# Patient Record
Sex: Male | Born: 1955 | Race: White | Hispanic: No | Smoking: Former smoker
Health system: Southern US, Community
[De-identification: ages and names within clinical notes are randomized; demographics above are authoritative.]

## PROBLEM LIST (undated history)

## (undated) DIAGNOSIS — R1084 Generalized abdominal pain: Secondary | ICD-10-CM

## (undated) DIAGNOSIS — M1990 Unspecified osteoarthritis, unspecified site: Secondary | ICD-10-CM

## (undated) DIAGNOSIS — K219 Gastro-esophageal reflux disease without esophagitis: Secondary | ICD-10-CM

## (undated) DIAGNOSIS — K5900 Constipation, unspecified: Secondary | ICD-10-CM

## (undated) DIAGNOSIS — G4730 Sleep apnea, unspecified: Secondary | ICD-10-CM

## (undated) DIAGNOSIS — M179 Osteoarthritis of knee, unspecified: Secondary | ICD-10-CM

## (undated) DIAGNOSIS — M5030 Other cervical disc degeneration, unspecified cervical region: Secondary | ICD-10-CM

## (undated) DIAGNOSIS — E119 Type 2 diabetes mellitus without complications: Secondary | ICD-10-CM

## (undated) DIAGNOSIS — D729 Disorder of white blood cells, unspecified: Secondary | ICD-10-CM

## (undated) DIAGNOSIS — H269 Unspecified cataract: Secondary | ICD-10-CM

## (undated) DIAGNOSIS — N200 Calculus of kidney: Secondary | ICD-10-CM

## (undated) DIAGNOSIS — E669 Obesity, unspecified: Secondary | ICD-10-CM

## (undated) DIAGNOSIS — M810 Age-related osteoporosis without current pathological fracture: Secondary | ICD-10-CM

## (undated) DIAGNOSIS — D649 Anemia, unspecified: Secondary | ICD-10-CM

## (undated) DIAGNOSIS — H9190 Unspecified hearing loss, unspecified ear: Secondary | ICD-10-CM

## (undated) DIAGNOSIS — K279 Peptic ulcer, site unspecified, unspecified as acute or chronic, without hemorrhage or perforation: Secondary | ICD-10-CM

## (undated) DIAGNOSIS — M1710 Unilateral primary osteoarthritis, unspecified knee: Secondary | ICD-10-CM

## (undated) DIAGNOSIS — M6740 Ganglion, unspecified site: Secondary | ICD-10-CM

## (undated) DIAGNOSIS — I519 Heart disease, unspecified: Secondary | ICD-10-CM

## (undated) DIAGNOSIS — E785 Hyperlipidemia, unspecified: Secondary | ICD-10-CM

## (undated) DIAGNOSIS — B20 Human immunodeficiency virus [HIV] disease: Secondary | ICD-10-CM

## (undated) DIAGNOSIS — Z21 Asymptomatic human immunodeficiency virus [HIV] infection status: Secondary | ICD-10-CM

## (undated) HISTORY — DX: Unspecified cataract: H26.9

## (undated) HISTORY — DX: Sleep apnea, unspecified: G47.30

## (undated) HISTORY — DX: Ganglion, unspecified site: M67.40

## (undated) HISTORY — DX: Human immunodeficiency virus (HIV) disease: B20

## (undated) HISTORY — DX: Calculus of kidney: N20.0

## (undated) HISTORY — DX: Osteoarthritis of knee, unspecified: M17.9

## (undated) HISTORY — DX: Generalized abdominal pain: R10.84

## (undated) HISTORY — DX: Unspecified hearing loss, unspecified ear: H91.90

## (undated) HISTORY — DX: Anemia, unspecified: D64.9

## (undated) HISTORY — PX: SPINE SURGERY: SHX786

## (undated) HISTORY — DX: Gastro-esophageal reflux disease without esophagitis: K21.9

## (undated) HISTORY — DX: Asymptomatic human immunodeficiency virus (hiv) infection status: Z21

## (undated) HISTORY — DX: Age-related osteoporosis without current pathological fracture: M81.0

## (undated) HISTORY — PX: COLONOSCOPY: SHX174

## (undated) HISTORY — PX: CERVICAL DISCECTOMY: SHX98

## (undated) HISTORY — DX: Unspecified osteoarthritis, unspecified site: M19.90

## (undated) HISTORY — DX: Other cervical disc degeneration, unspecified cervical region: M50.30

## (undated) HISTORY — DX: Hyperlipidemia, unspecified: E78.5

## (undated) HISTORY — DX: Type 2 diabetes mellitus without complications: E11.9

## (undated) HISTORY — DX: Disorder of white blood cells, unspecified: D72.9

## (undated) HISTORY — DX: Peptic ulcer, site unspecified, unspecified as acute or chronic, without hemorrhage or perforation: K27.9

## (undated) HISTORY — PX: POLYPECTOMY: SHX149

## (undated) HISTORY — DX: Constipation, unspecified: K59.00

## (undated) HISTORY — DX: Heart disease, unspecified: I51.9

## (undated) HISTORY — DX: Unilateral primary osteoarthritis, unspecified knee: M17.10

---
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
## 1997-02-09 ENCOUNTER — Encounter: Payer: Self-pay | Admitting: Infectious Disease

## 1997-02-09 ENCOUNTER — Encounter (INDEPENDENT_AMBULATORY_CARE_PROVIDER_SITE_OTHER): Payer: Self-pay | Admitting: *Deleted

## 1997-02-09 LAB — CONVERTED CEMR LAB
CD4 Count: 60 microliters
CD4 T Cell Abs: 60
HIV 1 RNA Quant: 543301 copies/mL

## 1998-04-09 ENCOUNTER — Encounter: Admission: RE | Admit: 1998-04-09 | Discharge: 1998-04-09 | Payer: Self-pay | Admitting: Infectious Diseases

## 1998-04-23 ENCOUNTER — Encounter: Admission: RE | Admit: 1998-04-23 | Discharge: 1998-04-23 | Payer: Self-pay | Admitting: Infectious Diseases

## 2000-08-10 ENCOUNTER — Ambulatory Visit (HOSPITAL_COMMUNITY): Admission: RE | Admit: 2000-08-10 | Discharge: 2000-08-10 | Payer: Self-pay | Admitting: Infectious Diseases

## 2000-08-24 ENCOUNTER — Encounter: Admission: RE | Admit: 2000-08-24 | Discharge: 2000-08-24 | Payer: Self-pay | Admitting: Infectious Diseases

## 2000-11-20 ENCOUNTER — Encounter: Admission: RE | Admit: 2000-11-20 | Discharge: 2000-11-20 | Payer: Self-pay | Admitting: Internal Medicine

## 2000-12-07 ENCOUNTER — Ambulatory Visit (HOSPITAL_COMMUNITY): Admission: RE | Admit: 2000-12-07 | Discharge: 2000-12-07 | Payer: Self-pay | Admitting: Infectious Diseases

## 2000-12-07 ENCOUNTER — Encounter: Admission: RE | Admit: 2000-12-07 | Discharge: 2000-12-07 | Payer: Self-pay | Admitting: Infectious Diseases

## 2001-02-17 ENCOUNTER — Encounter: Admission: RE | Admit: 2001-02-17 | Discharge: 2001-02-17 | Payer: Self-pay | Admitting: Internal Medicine

## 2001-12-27 ENCOUNTER — Encounter: Admission: RE | Admit: 2001-12-27 | Discharge: 2001-12-27 | Payer: Self-pay | Admitting: Infectious Diseases

## 2001-12-27 ENCOUNTER — Ambulatory Visit (HOSPITAL_COMMUNITY): Admission: RE | Admit: 2001-12-27 | Discharge: 2001-12-27 | Payer: Self-pay | Admitting: Infectious Diseases

## 2002-01-31 ENCOUNTER — Encounter: Admission: RE | Admit: 2002-01-31 | Discharge: 2002-01-31 | Payer: Self-pay | Admitting: Infectious Diseases

## 2002-02-21 ENCOUNTER — Encounter: Admission: RE | Admit: 2002-02-21 | Discharge: 2002-02-21 | Payer: Self-pay | Admitting: Infectious Diseases

## 2002-03-28 ENCOUNTER — Encounter: Admission: RE | Admit: 2002-03-28 | Discharge: 2002-03-28 | Payer: Self-pay | Admitting: Infectious Diseases

## 2002-03-28 ENCOUNTER — Ambulatory Visit (HOSPITAL_COMMUNITY): Admission: RE | Admit: 2002-03-28 | Discharge: 2002-03-28 | Payer: Self-pay | Admitting: Infectious Diseases

## 2002-05-02 ENCOUNTER — Encounter: Admission: RE | Admit: 2002-05-02 | Discharge: 2002-05-02 | Payer: Self-pay | Admitting: Infectious Diseases

## 2002-05-02 ENCOUNTER — Ambulatory Visit (HOSPITAL_COMMUNITY): Admission: RE | Admit: 2002-05-02 | Discharge: 2002-05-02 | Payer: Self-pay | Admitting: Infectious Diseases

## 2002-05-30 ENCOUNTER — Encounter: Admission: RE | Admit: 2002-05-30 | Discharge: 2002-05-30 | Payer: Self-pay | Admitting: Infectious Diseases

## 2002-06-13 ENCOUNTER — Encounter: Admission: RE | Admit: 2002-06-13 | Discharge: 2002-06-13 | Payer: Self-pay | Admitting: Infectious Diseases

## 2002-07-25 ENCOUNTER — Encounter: Admission: RE | Admit: 2002-07-25 | Discharge: 2002-07-25 | Payer: Self-pay | Admitting: Infectious Diseases

## 2002-09-01 ENCOUNTER — Ambulatory Visit (HOSPITAL_COMMUNITY): Admission: RE | Admit: 2002-09-01 | Discharge: 2002-09-01 | Payer: Self-pay | Admitting: Infectious Diseases

## 2002-09-01 ENCOUNTER — Encounter: Admission: RE | Admit: 2002-09-01 | Discharge: 2002-09-01 | Payer: Self-pay | Admitting: Infectious Diseases

## 2002-09-16 ENCOUNTER — Encounter: Payer: Self-pay | Admitting: Infectious Diseases

## 2002-09-16 ENCOUNTER — Ambulatory Visit (HOSPITAL_COMMUNITY): Admission: RE | Admit: 2002-09-16 | Discharge: 2002-09-16 | Payer: Self-pay | Admitting: Infectious Diseases

## 2002-09-20 ENCOUNTER — Encounter: Admission: RE | Admit: 2002-09-20 | Discharge: 2002-09-20 | Payer: Self-pay | Admitting: Infectious Diseases

## 2002-11-08 ENCOUNTER — Encounter: Admission: RE | Admit: 2002-11-08 | Discharge: 2002-11-08 | Payer: Self-pay | Admitting: Infectious Diseases

## 2002-12-08 ENCOUNTER — Encounter: Admission: RE | Admit: 2002-12-08 | Discharge: 2002-12-08 | Payer: Self-pay | Admitting: Infectious Diseases

## 2002-12-08 ENCOUNTER — Ambulatory Visit (HOSPITAL_COMMUNITY): Admission: RE | Admit: 2002-12-08 | Discharge: 2002-12-08 | Payer: Self-pay | Admitting: Infectious Diseases

## 2002-12-08 ENCOUNTER — Encounter: Admission: RE | Admit: 2002-12-08 | Discharge: 2002-12-08 | Payer: Self-pay | Admitting: Internal Medicine

## 2003-01-09 ENCOUNTER — Encounter: Admission: RE | Admit: 2003-01-09 | Discharge: 2003-01-09 | Payer: Self-pay | Admitting: Infectious Diseases

## 2003-03-06 ENCOUNTER — Encounter: Admission: RE | Admit: 2003-03-06 | Discharge: 2003-03-06 | Payer: Self-pay | Admitting: Infectious Diseases

## 2003-03-27 ENCOUNTER — Encounter: Admission: RE | Admit: 2003-03-27 | Discharge: 2003-03-27 | Payer: Self-pay | Admitting: Infectious Diseases

## 2003-05-01 ENCOUNTER — Encounter: Admission: RE | Admit: 2003-05-01 | Discharge: 2003-05-01 | Payer: Self-pay | Admitting: Infectious Diseases

## 2003-05-01 ENCOUNTER — Encounter (INDEPENDENT_AMBULATORY_CARE_PROVIDER_SITE_OTHER): Payer: Self-pay | Admitting: Infectious Diseases

## 2003-05-22 ENCOUNTER — Encounter: Admission: RE | Admit: 2003-05-22 | Discharge: 2003-05-22 | Payer: Self-pay | Admitting: Infectious Diseases

## 2003-07-27 ENCOUNTER — Other Ambulatory Visit (HOSPITAL_COMMUNITY): Admission: RE | Admit: 2003-07-27 | Discharge: 2003-08-18 | Payer: Self-pay | Admitting: Psychiatry

## 2003-08-21 ENCOUNTER — Encounter (INDEPENDENT_AMBULATORY_CARE_PROVIDER_SITE_OTHER): Payer: Self-pay | Admitting: Infectious Diseases

## 2003-08-21 ENCOUNTER — Ambulatory Visit (HOSPITAL_COMMUNITY): Admission: RE | Admit: 2003-08-21 | Discharge: 2003-08-21 | Payer: Self-pay | Admitting: Infectious Diseases

## 2003-08-21 ENCOUNTER — Encounter: Admission: RE | Admit: 2003-08-21 | Discharge: 2003-08-21 | Payer: Self-pay | Admitting: Infectious Diseases

## 2003-09-04 ENCOUNTER — Encounter: Admission: RE | Admit: 2003-09-04 | Discharge: 2003-09-04 | Payer: Self-pay | Admitting: Infectious Diseases

## 2003-11-15 ENCOUNTER — Encounter (INDEPENDENT_AMBULATORY_CARE_PROVIDER_SITE_OTHER): Payer: Self-pay | Admitting: Infectious Diseases

## 2003-11-15 ENCOUNTER — Encounter: Admission: RE | Admit: 2003-11-15 | Discharge: 2003-11-15 | Payer: Self-pay | Admitting: Infectious Diseases

## 2003-11-15 ENCOUNTER — Ambulatory Visit (HOSPITAL_COMMUNITY): Admission: RE | Admit: 2003-11-15 | Discharge: 2003-11-15 | Payer: Self-pay | Admitting: Infectious Diseases

## 2003-12-22 ENCOUNTER — Encounter: Admission: RE | Admit: 2003-12-22 | Discharge: 2003-12-22 | Payer: Self-pay | Admitting: Internal Medicine

## 2003-12-25 ENCOUNTER — Encounter: Admission: RE | Admit: 2003-12-25 | Discharge: 2003-12-25 | Payer: Self-pay | Admitting: Infectious Diseases

## 2004-01-30 ENCOUNTER — Emergency Department (HOSPITAL_COMMUNITY): Admission: EM | Admit: 2004-01-30 | Discharge: 2004-01-30 | Payer: Self-pay | Admitting: Emergency Medicine

## 2004-04-15 ENCOUNTER — Encounter: Admission: RE | Admit: 2004-04-15 | Discharge: 2004-04-15 | Payer: Self-pay | Admitting: Infectious Diseases

## 2004-04-29 ENCOUNTER — Encounter: Admission: RE | Admit: 2004-04-29 | Discharge: 2004-04-29 | Payer: Self-pay | Admitting: Infectious Diseases

## 2004-08-27 ENCOUNTER — Ambulatory Visit (HOSPITAL_COMMUNITY): Admission: RE | Admit: 2004-08-27 | Discharge: 2004-08-27 | Payer: Self-pay | Admitting: Infectious Diseases

## 2004-08-27 ENCOUNTER — Ambulatory Visit: Payer: Self-pay | Admitting: Infectious Diseases

## 2004-09-10 ENCOUNTER — Ambulatory Visit: Payer: Self-pay | Admitting: Infectious Diseases

## 2004-11-27 ENCOUNTER — Ambulatory Visit: Payer: Self-pay | Admitting: Infectious Diseases

## 2004-11-27 ENCOUNTER — Ambulatory Visit (HOSPITAL_COMMUNITY): Admission: RE | Admit: 2004-11-27 | Discharge: 2004-11-27 | Payer: Self-pay | Admitting: Infectious Diseases

## 2004-12-16 ENCOUNTER — Ambulatory Visit: Payer: Self-pay | Admitting: Infectious Diseases

## 2005-03-03 ENCOUNTER — Ambulatory Visit (HOSPITAL_COMMUNITY): Admission: RE | Admit: 2005-03-03 | Discharge: 2005-03-03 | Payer: Self-pay | Admitting: Infectious Diseases

## 2005-03-03 ENCOUNTER — Ambulatory Visit: Payer: Self-pay | Admitting: Internal Medicine

## 2005-03-17 ENCOUNTER — Ambulatory Visit: Payer: Self-pay | Admitting: Infectious Diseases

## 2005-07-15 ENCOUNTER — Emergency Department (HOSPITAL_COMMUNITY): Admission: EM | Admit: 2005-07-15 | Discharge: 2005-07-15 | Payer: Self-pay | Admitting: Family Medicine

## 2005-08-18 ENCOUNTER — Ambulatory Visit (HOSPITAL_COMMUNITY): Admission: RE | Admit: 2005-08-18 | Discharge: 2005-08-18 | Payer: Self-pay | Admitting: Infectious Diseases

## 2005-08-18 ENCOUNTER — Ambulatory Visit: Payer: Self-pay | Admitting: Infectious Diseases

## 2005-08-18 ENCOUNTER — Encounter (INDEPENDENT_AMBULATORY_CARE_PROVIDER_SITE_OTHER): Payer: Self-pay | Admitting: *Deleted

## 2005-08-18 LAB — CONVERTED CEMR LAB
CD4 Count: 530 uL
HIV 1 RNA Quant: 85 {copies}/mL

## 2005-09-01 ENCOUNTER — Ambulatory Visit: Payer: Self-pay | Admitting: Infectious Diseases

## 2006-04-22 ENCOUNTER — Emergency Department (HOSPITAL_COMMUNITY): Admission: EM | Admit: 2006-04-22 | Discharge: 2006-04-22 | Payer: Self-pay | Admitting: Emergency Medicine

## 2006-05-04 ENCOUNTER — Encounter (INDEPENDENT_AMBULATORY_CARE_PROVIDER_SITE_OTHER): Payer: Self-pay | Admitting: *Deleted

## 2006-05-04 ENCOUNTER — Ambulatory Visit: Payer: Self-pay | Admitting: Infectious Diseases

## 2006-05-04 ENCOUNTER — Encounter: Admission: RE | Admit: 2006-05-04 | Discharge: 2006-05-04 | Payer: Self-pay | Admitting: Infectious Diseases

## 2006-05-04 LAB — CONVERTED CEMR LAB
CD4 Count: 590 microliters
HIV 1 RNA Quant: 49 copies/mL

## 2006-05-18 ENCOUNTER — Ambulatory Visit: Payer: Self-pay | Admitting: Infectious Diseases

## 2006-11-02 ENCOUNTER — Encounter (INDEPENDENT_AMBULATORY_CARE_PROVIDER_SITE_OTHER): Payer: Self-pay | Admitting: *Deleted

## 2006-11-02 ENCOUNTER — Encounter: Admission: RE | Admit: 2006-11-02 | Discharge: 2006-11-02 | Payer: Self-pay | Admitting: Infectious Diseases

## 2006-11-02 ENCOUNTER — Ambulatory Visit: Payer: Self-pay | Admitting: Infectious Diseases

## 2006-11-02 LAB — CONVERTED CEMR LAB
ALT: 16 units/L (ref 0–53)
AST: 15 units/L (ref 0–37)
Albumin: 3.9 g/dL (ref 3.5–5.2)
Alkaline Phosphatase: 113 units/L (ref 39–117)
BUN: 12 mg/dL (ref 6–23)
Basophils Absolute: 0.1 10*3/uL (ref 0.0–0.1)
Basophils Relative: 1 % (ref 0–1)
CD4 Count: 610 microliters
CO2: 25 meq/L (ref 19–32)
Calcium: 9.1 mg/dL (ref 8.4–10.5)
Chloride: 107 meq/L (ref 96–112)
Creatinine, Ser: 0.94 mg/dL (ref 0.40–1.50)
Eosinophils Relative: 4 % (ref 0–4)
Glucose, Bld: 94 mg/dL (ref 70–99)
HCT: 49.3 % — ABNORMAL HIGH (ref 41.0–49.0)
HIV 1 RNA Quant: 208 copies/mL
HIV 1 RNA Quant: 208 copies/mL — ABNORMAL HIGH (ref <50)
HIV-1 RNA Quant, Log: 2.32 — ABNORMAL HIGH (ref <1.70)
Hemoglobin: 16 g/dL (ref 13.9–16.8)
Lymphocytes Relative: 31 % (ref 15–43)
Lymphs Abs: 3 10*3/uL (ref 0.8–3.1)
MCHC: 32.5 g/dL — ABNORMAL LOW (ref 33.1–35.4)
MCV: 100 fL (ref 78.8–100.0)
Monocytes Absolute: 0.8 10*3/uL — ABNORMAL HIGH (ref 0.2–0.7)
Monocytes Relative: 8 % (ref 3–11)
Neutro Abs: 5.4 10*3/uL (ref 1.8–6.8)
Neutrophils Relative %: 56 % (ref 47–77)
Platelets: 358 10*3/uL (ref 152–374)
Potassium: 4.5 meq/L (ref 3.5–5.3)
RBC: 4.93 M/uL (ref 4.20–5.50)
RDW: 13.7 % (ref 11.5–15.3)
Sodium: 146 meq/L — ABNORMAL HIGH (ref 135–145)
Total Bilirubin: 0.4 mg/dL (ref 0.3–1.2)
Total Protein: 7.1 g/dL (ref 6.0–8.3)
WBC: 9.7 10*3/uL (ref 3.7–10.0)

## 2006-11-16 ENCOUNTER — Ambulatory Visit: Payer: Self-pay | Admitting: Infectious Diseases

## 2006-12-28 DIAGNOSIS — B20 Human immunodeficiency virus [HIV] disease: Secondary | ICD-10-CM | POA: Insufficient documentation

## 2007-01-11 ENCOUNTER — Encounter (INDEPENDENT_AMBULATORY_CARE_PROVIDER_SITE_OTHER): Payer: Self-pay | Admitting: Infectious Diseases

## 2007-01-25 ENCOUNTER — Encounter (INDEPENDENT_AMBULATORY_CARE_PROVIDER_SITE_OTHER): Payer: Self-pay | Admitting: *Deleted

## 2007-01-25 LAB — CONVERTED CEMR LAB

## 2007-02-07 ENCOUNTER — Encounter (INDEPENDENT_AMBULATORY_CARE_PROVIDER_SITE_OTHER): Payer: Self-pay | Admitting: *Deleted

## 2007-02-09 ENCOUNTER — Encounter (INDEPENDENT_AMBULATORY_CARE_PROVIDER_SITE_OTHER): Payer: Self-pay | Admitting: *Deleted

## 2007-05-11 ENCOUNTER — Ambulatory Visit: Payer: Self-pay | Admitting: Infectious Diseases

## 2007-05-11 ENCOUNTER — Encounter: Admission: RE | Admit: 2007-05-11 | Discharge: 2007-05-11 | Payer: Self-pay | Admitting: Infectious Diseases

## 2007-05-11 LAB — CONVERTED CEMR LAB
ALT: 13 units/L (ref 0–53)
AST: 14 units/L (ref 0–37)
Albumin: 4.2 g/dL (ref 3.5–5.2)
Alkaline Phosphatase: 129 units/L — ABNORMAL HIGH (ref 39–117)
BUN: 12 mg/dL (ref 6–23)
Basophils Absolute: 0.1 10*3/uL (ref 0.0–0.1)
Basophils Relative: 1 % (ref 0–1)
Bilirubin Urine: NEGATIVE
CD4 Count: 890 microliters
CO2: 25 meq/L (ref 19–32)
Calcium: 9.5 mg/dL (ref 8.4–10.5)
Chloride: 102 meq/L (ref 96–112)
Cholesterol: 202 mg/dL — ABNORMAL HIGH (ref 0–200)
Creatinine, Ser: 0.89 mg/dL (ref 0.40–1.50)
Eosinophils Absolute: 0.4 10*3/uL (ref 0.0–0.7)
Eosinophils Relative: 3 % (ref 0–5)
Glucose, Bld: 59 mg/dL — ABNORMAL LOW (ref 70–99)
HCT: 48.1 % (ref 39.0–52.0)
HDL: 41 mg/dL (ref >39)
HIV 1 RNA Quant: 131 copies/mL — ABNORMAL HIGH (ref <50)
HIV-1 RNA Quant, Log: 2.12 — ABNORMAL HIGH (ref <1.70)
Hemoglobin: 16.3 g/dL (ref 13.0–17.0)
Ketones, ur: NEGATIVE mg/dL
LDL Cholesterol: 101 mg/dL — ABNORMAL HIGH (ref 0–99)
Leukocytes, UA: NEGATIVE
Lymphocytes Relative: 39 % (ref 12–46)
Lymphs Abs: 4.8 10*3/uL — ABNORMAL HIGH (ref 0.7–3.3)
MCHC: 33.9 g/dL (ref 30.0–36.0)
MCV: 96.4 fL (ref 78.0–100.0)
Monocytes Absolute: 1.2 10*3/uL — ABNORMAL HIGH (ref 0.2–0.7)
Monocytes Relative: 10 % (ref 3–11)
Neutro Abs: 5.7 10*3/uL (ref 1.7–7.7)
Neutrophils Relative %: 47 % (ref 43–77)
Nitrite: NEGATIVE
Platelets: 366 10*3/uL (ref 150–400)
Potassium: 4.5 meq/L (ref 3.5–5.3)
Protein, ur: NEGATIVE mg/dL
RBC: 4.99 M/uL (ref 4.22–5.81)
RDW: 14.1 % — ABNORMAL HIGH (ref 11.5–14.0)
Sodium: 138 meq/L (ref 135–145)
Specific Gravity, Urine: 1.023 (ref 1.005–1.03)
Total Bilirubin: 0.4 mg/dL (ref 0.3–1.2)
Total CHOL/HDL Ratio: 4.9
Total Protein: 8 g/dL (ref 6.0–8.3)
Triglycerides: 300 mg/dL — ABNORMAL HIGH (ref <150)
Urine Glucose: NEGATIVE mg/dL
Urobilinogen, UA: 0.2 (ref 0.0–1.0)
VLDL: 60 mg/dL — ABNORMAL HIGH (ref 0–40)
WBC, UA: NONE SEEN cells/hpf (ref <3)
WBC: 12.2 10*3/uL — ABNORMAL HIGH (ref 4.0–10.5)
pH: 6 (ref 5.0–8.0)

## 2007-05-18 ENCOUNTER — Ambulatory Visit: Payer: Self-pay | Admitting: Infectious Diseases

## 2007-05-18 ENCOUNTER — Encounter (INDEPENDENT_AMBULATORY_CARE_PROVIDER_SITE_OTHER): Payer: Self-pay | Admitting: *Deleted

## 2007-06-18 ENCOUNTER — Encounter (INDEPENDENT_AMBULATORY_CARE_PROVIDER_SITE_OTHER): Payer: Self-pay | Admitting: *Deleted

## 2007-09-02 ENCOUNTER — Encounter: Admission: RE | Admit: 2007-09-02 | Discharge: 2007-09-02 | Payer: Self-pay | Admitting: Infectious Disease

## 2007-09-02 ENCOUNTER — Ambulatory Visit: Payer: Self-pay | Admitting: Infectious Disease

## 2007-09-02 LAB — CONVERTED CEMR LAB
Basophils Absolute: 0.1 10*3/uL (ref 0.0–0.1)
Basophils Relative: 1 % (ref 0–1)
Eosinophils Absolute: 0.3 10*3/uL (ref 0.0–0.7)
Eosinophils Relative: 3 % (ref 0–5)
HCT: 48.5 % (ref 39.0–52.0)
HIV 1 RNA Quant: 154 copies/mL — ABNORMAL HIGH (ref <50)
HIV-1 RNA Quant, Log: 2.19 — ABNORMAL HIGH (ref <1.70)
Hemoglobin: 16 g/dL (ref 13.0–17.0)
Lymphocytes Relative: 35 % (ref 12–46)
Lymphs Abs: 3.2 10*3/uL (ref 0.7–3.3)
MCHC: 33 g/dL (ref 30.0–36.0)
MCV: 97.8 fL (ref 78.0–100.0)
Monocytes Absolute: 0.7 10*3/uL (ref 0.2–0.7)
Monocytes Relative: 8 % (ref 3–11)
Neutro Abs: 4.8 10*3/uL (ref 1.7–7.7)
Neutrophils Relative %: 53 % (ref 43–77)
Platelets: 372 10*3/uL (ref 150–400)
RBC: 4.96 M/uL (ref 4.22–5.81)
RDW: 14.2 % — ABNORMAL HIGH (ref 11.5–14.0)
WBC: 9.2 10*3/uL (ref 4.0–10.5)

## 2007-09-29 ENCOUNTER — Ambulatory Visit: Payer: Self-pay | Admitting: Infectious Disease

## 2007-09-29 DIAGNOSIS — F17200 Nicotine dependence, unspecified, uncomplicated: Secondary | ICD-10-CM | POA: Insufficient documentation

## 2007-11-19 ENCOUNTER — Ambulatory Visit: Payer: Self-pay | Admitting: Infectious Disease

## 2007-11-19 ENCOUNTER — Encounter: Admission: RE | Admit: 2007-11-19 | Discharge: 2007-11-19 | Payer: Self-pay | Admitting: Infectious Disease

## 2007-11-19 LAB — CONVERTED CEMR LAB
ALT: 32 units/L (ref 0–53)
AST: 25 units/L (ref 0–37)
Albumin: 4.3 g/dL (ref 3.5–5.2)
Alkaline Phosphatase: 151 units/L — ABNORMAL HIGH (ref 39–117)
BUN: 11 mg/dL (ref 6–23)
Basophils Absolute: 0.2 10*3/uL — ABNORMAL HIGH (ref 0.0–0.1)
Basophils Relative: 2 % — ABNORMAL HIGH (ref 0–1)
CO2: 23 meq/L (ref 19–32)
Calcium: 9.5 mg/dL (ref 8.4–10.5)
Chloride: 100 meq/L (ref 96–112)
Creatinine, Ser: 0.97 mg/dL (ref 0.40–1.50)
Eosinophils Absolute: 0.3 10*3/uL (ref 0.2–0.7)
Eosinophils Relative: 3 % (ref 0–5)
Glucose, Bld: 124 mg/dL — ABNORMAL HIGH (ref 70–99)
HCT: 50.2 % (ref 39.0–52.0)
HIV 1 RNA Quant: <50 copies/mL (ref <50)
HIV-1 RNA Quant, Log: <1.7 (ref <1.70)
Hemoglobin: 16.9 g/dL (ref 13.0–17.0)
Lymphocytes Relative: 33 % (ref 12–46)
Lymphs Abs: 3.3 10*3/uL (ref 0.7–4.0)
MCHC: 33.7 g/dL (ref 30.0–36.0)
MCV: 95.8 fL (ref 78.0–100.0)
Monocytes Absolute: 0.9 10*3/uL (ref 0.1–1.0)
Monocytes Relative: 10 % (ref 3–12)
Neutro Abs: 5.2 10*3/uL (ref 1.7–7.7)
Neutrophils Relative %: 53 % (ref 43–77)
Platelets: 355 10*3/uL (ref 150–400)
Potassium: 4.2 meq/L (ref 3.5–5.3)
RBC: 5.24 M/uL (ref 4.22–5.81)
RDW: 13.7 % (ref 11.5–15.5)
Sodium: 136 meq/L (ref 135–145)
Total Bilirubin: 0.3 mg/dL (ref 0.3–1.2)
Total Protein: 7.8 g/dL (ref 6.0–8.3)
WBC: 9.9 10*3/uL (ref 4.0–10.5)

## 2007-11-29 ENCOUNTER — Encounter (INDEPENDENT_AMBULATORY_CARE_PROVIDER_SITE_OTHER): Payer: Self-pay | Admitting: *Deleted

## 2007-11-30 ENCOUNTER — Encounter (INDEPENDENT_AMBULATORY_CARE_PROVIDER_SITE_OTHER): Payer: Self-pay | Admitting: *Deleted

## 2007-12-13 ENCOUNTER — Ambulatory Visit: Payer: Self-pay | Admitting: Infectious Disease

## 2007-12-16 ENCOUNTER — Encounter: Payer: Self-pay | Admitting: Infectious Disease

## 2007-12-16 ENCOUNTER — Ambulatory Visit (HOSPITAL_COMMUNITY): Admission: RE | Admit: 2007-12-16 | Discharge: 2007-12-16 | Payer: Self-pay | Admitting: Infectious Disease

## 2007-12-20 ENCOUNTER — Telehealth (INDEPENDENT_AMBULATORY_CARE_PROVIDER_SITE_OTHER): Payer: Self-pay | Admitting: *Deleted

## 2007-12-22 ENCOUNTER — Ambulatory Visit: Payer: Self-pay | Admitting: Infectious Disease

## 2007-12-22 LAB — CONVERTED CEMR LAB: Crypto Ag: NEGATIVE

## 2007-12-23 ENCOUNTER — Telehealth: Payer: Self-pay | Admitting: Infectious Disease

## 2007-12-24 ENCOUNTER — Telehealth: Payer: Self-pay | Admitting: Infectious Disease

## 2007-12-27 ENCOUNTER — Encounter: Payer: Self-pay | Admitting: Infectious Disease

## 2007-12-29 ENCOUNTER — Telehealth: Payer: Self-pay | Admitting: Infectious Disease

## 2007-12-31 ENCOUNTER — Emergency Department (HOSPITAL_COMMUNITY): Admission: EM | Admit: 2007-12-31 | Discharge: 2007-12-31 | Payer: Self-pay | Admitting: Emergency Medicine

## 2007-12-31 ENCOUNTER — Telehealth: Payer: Self-pay | Admitting: Infectious Disease

## 2008-01-01 ENCOUNTER — Encounter: Payer: Self-pay | Admitting: Infectious Disease

## 2008-01-03 ENCOUNTER — Ambulatory Visit: Payer: Self-pay | Admitting: Infectious Disease

## 2008-01-03 DIAGNOSIS — K279 Peptic ulcer, site unspecified, unspecified as acute or chronic, without hemorrhage or perforation: Secondary | ICD-10-CM | POA: Insufficient documentation

## 2008-01-03 LAB — CONVERTED CEMR LAB
Basophils Absolute: 0.1 10*3/uL (ref 0.0–0.1)
Basophils Relative: 1 % (ref 0–1)
Eosinophils Absolute: 0.3 10*3/uL (ref 0.0–0.7)
Eosinophils Relative: 3 % (ref 0–5)
HCT: 48.5 % (ref 39.0–52.0)
Hemoglobin: 16.4 g/dL (ref 13.0–17.0)
Lymphocytes Relative: 34 % (ref 12–46)
Lymphs Abs: 3.9 10*3/uL (ref 0.7–4.0)
MCHC: 33.8 g/dL (ref 30.0–36.0)
MCV: 95.5 fL (ref 78.0–100.0)
Monocytes Absolute: 1.2 10*3/uL — ABNORMAL HIGH (ref 0.1–1.0)
Monocytes Relative: 11 % (ref 3–12)
Neutro Abs: 5.8 10*3/uL (ref 1.7–7.7)
Neutrophils Relative %: 52 % (ref 43–77)
Platelets: 353 10*3/uL (ref 150–400)
RBC: 5.08 M/uL (ref 4.22–5.81)
RDW: 13.9 % (ref 11.5–15.5)
WBC: 11.3 10*3/uL — ABNORMAL HIGH (ref 4.0–10.5)

## 2008-01-12 ENCOUNTER — Telehealth: Payer: Self-pay

## 2008-01-17 ENCOUNTER — Encounter (INDEPENDENT_AMBULATORY_CARE_PROVIDER_SITE_OTHER): Payer: Self-pay | Admitting: *Deleted

## 2008-01-17 ENCOUNTER — Telehealth: Payer: Self-pay | Admitting: Infectious Disease

## 2008-01-21 ENCOUNTER — Encounter (INDEPENDENT_AMBULATORY_CARE_PROVIDER_SITE_OTHER): Payer: Self-pay | Admitting: *Deleted

## 2008-02-09 ENCOUNTER — Encounter: Admission: RE | Admit: 2008-02-09 | Discharge: 2008-02-09 | Payer: Self-pay | Admitting: Infectious Disease

## 2008-02-09 ENCOUNTER — Ambulatory Visit: Payer: Self-pay | Admitting: Infectious Disease

## 2008-02-09 LAB — CONVERTED CEMR LAB
ALT: 25 units/L (ref 0–53)
AST: 23 units/L (ref 0–37)
Albumin: 4.3 g/dL (ref 3.5–5.2)
Alkaline Phosphatase: 126 units/L — ABNORMAL HIGH (ref 39–117)
BUN: 10 mg/dL (ref 6–23)
Basophils Absolute: 0.1 10*3/uL (ref 0.0–0.1)
Basophils Relative: 1 % (ref 0–1)
CO2: 24 meq/L (ref 19–32)
Calcium: 9.5 mg/dL (ref 8.4–10.5)
Chloride: 103 meq/L (ref 96–112)
Creatinine, Ser: 0.75 mg/dL (ref 0.40–1.50)
Eosinophils Absolute: 0.3 10*3/uL (ref 0.0–0.7)
Eosinophils Relative: 3 % (ref 0–5)
Glucose, Bld: 106 mg/dL — ABNORMAL HIGH (ref 70–99)
HCT: 50.6 % (ref 39.0–52.0)
HIV 1 RNA Quant: 375 copies/mL — ABNORMAL HIGH (ref <50)
HIV-1 RNA Quant, Log: 2.57 — ABNORMAL HIGH (ref <1.70)
Hemoglobin: 16.6 g/dL (ref 13.0–17.0)
Lymphocytes Relative: 37 % (ref 12–46)
Lymphs Abs: 3.7 10*3/uL (ref 0.7–4.0)
MCHC: 32.8 g/dL (ref 30.0–36.0)
MCV: 95.1 fL (ref 78.0–100.0)
Monocytes Absolute: 1.1 10*3/uL — ABNORMAL HIGH (ref 0.1–1.0)
Monocytes Relative: 10 % (ref 3–12)
Neutro Abs: 5 10*3/uL (ref 1.7–7.7)
Neutrophils Relative %: 49 % (ref 43–77)
Platelets: 339 10*3/uL (ref 150–400)
Potassium: 4.2 meq/L (ref 3.5–5.3)
RBC: 5.32 M/uL (ref 4.22–5.81)
RDW: 14.1 % (ref 11.5–15.5)
Sodium: 139 meq/L (ref 135–145)
Total Bilirubin: 0.4 mg/dL (ref 0.3–1.2)
Total Protein: 7.8 g/dL (ref 6.0–8.3)
WBC: 10.2 10*3/uL (ref 4.0–10.5)

## 2008-02-28 ENCOUNTER — Ambulatory Visit: Payer: Self-pay | Admitting: Infectious Disease

## 2008-02-28 DIAGNOSIS — G609 Hereditary and idiopathic neuropathy, unspecified: Secondary | ICD-10-CM | POA: Insufficient documentation

## 2008-02-28 DIAGNOSIS — F0630 Mood disorder due to known physiological condition, unspecified: Secondary | ICD-10-CM | POA: Insufficient documentation

## 2008-03-01 ENCOUNTER — Ambulatory Visit (HOSPITAL_COMMUNITY): Admission: RE | Admit: 2008-03-01 | Discharge: 2008-03-01 | Payer: Self-pay | Admitting: Infectious Disease

## 2008-04-06 ENCOUNTER — Telehealth: Payer: Self-pay | Admitting: Infectious Disease

## 2008-04-06 DIAGNOSIS — K047 Periapical abscess without sinus: Secondary | ICD-10-CM | POA: Insufficient documentation

## 2008-04-19 ENCOUNTER — Telehealth: Payer: Self-pay | Admitting: Infectious Disease

## 2008-04-21 ENCOUNTER — Ambulatory Visit: Payer: Self-pay | Admitting: Gastroenterology

## 2008-04-21 DIAGNOSIS — E785 Hyperlipidemia, unspecified: Secondary | ICD-10-CM | POA: Insufficient documentation

## 2008-04-21 DIAGNOSIS — K219 Gastro-esophageal reflux disease without esophagitis: Secondary | ICD-10-CM | POA: Insufficient documentation

## 2008-05-01 ENCOUNTER — Ambulatory Visit: Payer: Self-pay | Admitting: Infectious Disease

## 2008-05-01 ENCOUNTER — Encounter: Admission: RE | Admit: 2008-05-01 | Discharge: 2008-05-01 | Payer: Self-pay | Admitting: Infectious Disease

## 2008-05-01 ENCOUNTER — Encounter (INDEPENDENT_AMBULATORY_CARE_PROVIDER_SITE_OTHER): Payer: Self-pay | Admitting: *Deleted

## 2008-05-01 LAB — CONVERTED CEMR LAB
ALT: 25 units/L (ref 0–53)
AST: 22 units/L (ref 0–37)
Albumin: 4.5 g/dL (ref 3.5–5.2)
Alkaline Phosphatase: 113 units/L (ref 39–117)
BUN: 13 mg/dL (ref 6–23)
Basophils Absolute: 0.1 10*3/uL (ref 0.0–0.1)
Basophils Relative: 1 % (ref 0–1)
CO2: 24 meq/L (ref 19–32)
Calcium: 10.2 mg/dL (ref 8.4–10.5)
Chloride: 98 meq/L (ref 96–112)
Cholesterol: 184 mg/dL (ref 0–200)
Creatinine, Ser: 0.92 mg/dL (ref 0.40–1.50)
Eosinophils Absolute: 0.3 10*3/uL (ref 0.0–0.7)
Eosinophils Relative: 3 % (ref 0–5)
Glucose, Bld: 100 mg/dL — ABNORMAL HIGH (ref 70–99)
HCT: 53.5 % — ABNORMAL HIGH (ref 39.0–52.0)
HDL: 38 mg/dL — ABNORMAL LOW (ref >39)
HIV 1 RNA Quant: 233 copies/mL — ABNORMAL HIGH (ref <50)
HIV-1 RNA Quant, Log: 2.37 — ABNORMAL HIGH (ref <1.70)
Hemoglobin: 18.1 g/dL — ABNORMAL HIGH (ref 13.0–17.0)
LDL Cholesterol: 88 mg/dL (ref 0–99)
Lymphocytes Relative: 33 % (ref 12–46)
Lymphs Abs: 3.3 10*3/uL (ref 0.7–4.0)
MCHC: 33.8 g/dL (ref 30.0–36.0)
MCV: 95.5 fL (ref 78.0–100.0)
Monocytes Absolute: 0.9 10*3/uL (ref 0.1–1.0)
Monocytes Relative: 9 % (ref 3–12)
Neutro Abs: 5.4 10*3/uL (ref 1.7–7.7)
Neutrophils Relative %: 53 % (ref 43–77)
Platelets: 348 10*3/uL (ref 150–400)
Potassium: 4.5 meq/L (ref 3.5–5.3)
RBC: 5.6 M/uL (ref 4.22–5.81)
RDW: 13.6 % (ref 11.5–15.5)
Sodium: 137 meq/L (ref 135–145)
Total Bilirubin: 0.3 mg/dL (ref 0.3–1.2)
Total CHOL/HDL Ratio: 4.8
Total Protein: 8.3 g/dL (ref 6.0–8.3)
Triglycerides: 290 mg/dL — ABNORMAL HIGH (ref <150)
VLDL: 58 mg/dL — ABNORMAL HIGH (ref 0–40)
WBC: 10 10*3/uL (ref 4.0–10.5)

## 2008-05-10 ENCOUNTER — Encounter: Payer: Self-pay | Admitting: Gastroenterology

## 2008-05-10 ENCOUNTER — Ambulatory Visit: Payer: Self-pay | Admitting: Gastroenterology

## 2008-05-11 ENCOUNTER — Encounter: Payer: Self-pay | Admitting: Gastroenterology

## 2008-05-22 ENCOUNTER — Ambulatory Visit: Payer: Self-pay | Admitting: Infectious Disease

## 2008-05-22 DIAGNOSIS — Z8679 Personal history of other diseases of the circulatory system: Secondary | ICD-10-CM | POA: Insufficient documentation

## 2008-05-22 DIAGNOSIS — D751 Secondary polycythemia: Secondary | ICD-10-CM | POA: Insufficient documentation

## 2008-05-22 LAB — CONVERTED CEMR LAB
Basophils Absolute: 0.1 10*3/uL (ref 0.0–0.1)
Basophils Relative: 1 % (ref 0–1)
Bilirubin Urine: NEGATIVE
CRP: 0.9 mg/dL — ABNORMAL HIGH (ref <0.6)
Chlamydia, Swab/Urine, PCR: NEGATIVE
Creatinine, Urine: 236.2 mg/dL
Eosinophils Absolute: 0.3 10*3/uL (ref 0.0–0.7)
Eosinophils Relative: 3 % (ref 0–5)
Erythropoietin: 8.9 milliintl units/mL (ref 2.6–34.0)
GC Probe Amp, Urine: NEGATIVE
HCT: 50.2 % (ref 39.0–52.0)
Hemoglobin, Urine: NEGATIVE
Hemoglobin: 16.3 g/dL (ref 13.0–17.0)
Ketones, ur: NEGATIVE mg/dL
Leukocytes, UA: NEGATIVE
Lymphocytes Relative: 35 % (ref 12–46)
Lymphs Abs: 3.3 10*3/uL (ref 0.7–4.0)
MCHC: 32.5 g/dL (ref 30.0–36.0)
MCV: 98 fL (ref 78.0–100.0)
Microalb Creat Ratio: 6.9 mg/g (ref 0.0–30.0)
Microalb, Ur: 1.64 mg/dL (ref 0.00–1.89)
Monocytes Absolute: 1.3 10*3/uL — ABNORMAL HIGH (ref 0.1–1.0)
Monocytes Relative: 13 % — ABNORMAL HIGH (ref 3–12)
Neutro Abs: 4.5 10*3/uL (ref 1.7–7.7)
Neutrophils Relative %: 47 % (ref 43–77)
Nitrite: NEGATIVE
Platelets: 335 10*3/uL (ref 150–400)
Protein, ur: NEGATIVE mg/dL
RBC: 5.12 M/uL (ref 4.22–5.81)
RDW: 14.1 % (ref 11.5–15.5)
Specific Gravity, Urine: 1.03 (ref 1.005–1.03)
Urine Glucose: 250 mg/dL — AB
Urobilinogen, UA: 0.2 (ref 0.0–1.0)
WBC: 9.6 10*3/uL (ref 4.0–10.5)
pH: 6.5 (ref 5.0–8.0)

## 2008-05-25 ENCOUNTER — Telehealth (INDEPENDENT_AMBULATORY_CARE_PROVIDER_SITE_OTHER): Payer: Self-pay | Admitting: *Deleted

## 2008-07-14 ENCOUNTER — Encounter: Payer: Self-pay | Admitting: Infectious Disease

## 2008-10-02 ENCOUNTER — Encounter: Payer: Self-pay | Admitting: Infectious Disease

## 2008-10-02 ENCOUNTER — Ambulatory Visit: Payer: Self-pay | Admitting: Infectious Diseases

## 2008-10-02 LAB — CONVERTED CEMR LAB
ALT: 20 units/L (ref 0–53)
AST: 20 units/L (ref 0–37)
Albumin: 4.3 g/dL (ref 3.5–5.2)
Alkaline Phosphatase: 106 units/L (ref 39–117)
BUN: 14 mg/dL (ref 6–23)
Basophils Absolute: 0.1 10*3/uL (ref 0.0–0.1)
Basophils Relative: 1 % (ref 0–1)
CO2: 21 meq/L (ref 19–32)
Calcium: 9.6 mg/dL (ref 8.4–10.5)
Chloride: 103 meq/L (ref 96–112)
Cholesterol: 161 mg/dL (ref 0–200)
Creatinine, Ser: 0.98 mg/dL (ref 0.40–1.50)
Eosinophils Absolute: 0.3 10*3/uL (ref 0.0–0.7)
Eosinophils Relative: 3 % (ref 0–5)
Glucose, Bld: 101 mg/dL — ABNORMAL HIGH (ref 70–99)
HCT: 53.1 % — ABNORMAL HIGH (ref 39.0–52.0)
HDL: 37 mg/dL — ABNORMAL LOW (ref >39)
HIV 1 RNA Quant: 207 copies/mL — ABNORMAL HIGH (ref <50)
HIV-1 RNA Quant, Log: 2.32 — ABNORMAL HIGH (ref <1.70)
Hemoglobin: 17.8 g/dL — ABNORMAL HIGH (ref 13.0–17.0)
LDL Cholesterol: 79 mg/dL (ref 0–99)
Lymphocytes Relative: 27 % (ref 12–46)
Lymphs Abs: 2.6 10*3/uL (ref 0.7–4.0)
MCHC: 33.5 g/dL (ref 30.0–36.0)
MCV: 96.7 fL (ref 78.0–100.0)
Monocytes Absolute: 0.8 10*3/uL (ref 0.1–1.0)
Monocytes Relative: 9 % (ref 3–12)
Neutro Abs: 5.7 10*3/uL (ref 1.7–7.7)
Neutrophils Relative %: 60 % (ref 43–77)
Platelets: 348 10*3/uL (ref 150–400)
Potassium: 4.5 meq/L (ref 3.5–5.3)
RBC: 5.49 M/uL (ref 4.22–5.81)
RDW: 14.5 % (ref 11.5–15.5)
Sodium: 138 meq/L (ref 135–145)
Total Bilirubin: 0.5 mg/dL (ref 0.3–1.2)
Total CHOL/HDL Ratio: 4.4
Total Protein: 7.7 g/dL (ref 6.0–8.3)
Triglycerides: 225 mg/dL — ABNORMAL HIGH (ref <150)
VLDL: 45 mg/dL — ABNORMAL HIGH (ref 0–40)
WBC: 9.6 10*3/uL (ref 4.0–10.5)

## 2008-10-16 ENCOUNTER — Ambulatory Visit: Payer: Self-pay | Admitting: Infectious Disease

## 2008-10-16 DIAGNOSIS — G4730 Sleep apnea, unspecified: Secondary | ICD-10-CM | POA: Insufficient documentation

## 2008-11-08 ENCOUNTER — Ambulatory Visit: Admission: RE | Admit: 2008-11-08 | Discharge: 2008-11-08 | Payer: Self-pay | Admitting: *Deleted

## 2008-12-18 ENCOUNTER — Encounter: Payer: Self-pay | Admitting: Infectious Disease

## 2009-01-18 ENCOUNTER — Ambulatory Visit: Payer: Self-pay | Admitting: Infectious Disease

## 2009-01-18 LAB — CONVERTED CEMR LAB
ALT: 15 units/L (ref 0–53)
AST: 17 units/L (ref 0–37)
Albumin: 4.3 g/dL (ref 3.5–5.2)
Alkaline Phosphatase: 97 units/L (ref 39–117)
BUN: 16 mg/dL (ref 6–23)
Basophils Absolute: 0.1 10*3/uL (ref 0.0–0.1)
Basophils Relative: 1 % (ref 0–1)
CO2: 23 meq/L (ref 19–32)
Calcium: 9.5 mg/dL (ref 8.4–10.5)
Chloride: 103 meq/L (ref 96–112)
Cholesterol: 179 mg/dL (ref 0–200)
Creatinine, Ser: 1.03 mg/dL (ref 0.40–1.50)
Eosinophils Absolute: 0.3 10*3/uL (ref 0.0–0.7)
Eosinophils Relative: 3 % (ref 0–5)
Glucose, Bld: 85 mg/dL (ref 70–99)
HCT: 51 % (ref 39.0–52.0)
HDL: 42 mg/dL (ref >39)
HIV 1 RNA Quant: 429 copies/mL — ABNORMAL HIGH (ref <48)
HIV-1 RNA Quant, Log: 2.63 — ABNORMAL HIGH (ref <1.68)
Hemoglobin: 17 g/dL (ref 13.0–17.0)
LDL Cholesterol: 108 mg/dL — ABNORMAL HIGH (ref 0–99)
Lymphocytes Relative: 33 % (ref 12–46)
Lymphs Abs: 3.5 10*3/uL (ref 0.7–4.0)
MCHC: 33.3 g/dL (ref 30.0–36.0)
MCV: 96.6 fL (ref 78.0–100.0)
Monocytes Absolute: 1 10*3/uL (ref 0.1–1.0)
Monocytes Relative: 9 % (ref 3–12)
Neutro Abs: 5.7 10*3/uL (ref 1.7–7.7)
Neutrophils Relative %: 53 % (ref 43–77)
Platelets: 339 10*3/uL (ref 150–400)
Potassium: 4.8 meq/L (ref 3.5–5.3)
RBC: 5.28 M/uL (ref 4.22–5.81)
RDW: 14.2 % (ref 11.5–15.5)
Sodium: 138 meq/L (ref 135–145)
Total Bilirubin: 0.3 mg/dL (ref 0.3–1.2)
Total CHOL/HDL Ratio: 4.3
Total Protein: 7.8 g/dL (ref 6.0–8.3)
Triglycerides: 147 mg/dL (ref <150)
VLDL: 29 mg/dL (ref 0–40)
WBC: 10.6 10*3/uL — ABNORMAL HIGH (ref 4.0–10.5)

## 2009-01-30 ENCOUNTER — Ambulatory Visit (HOSPITAL_COMMUNITY): Admission: RE | Admit: 2009-01-30 | Discharge: 2009-01-30 | Payer: Self-pay | Admitting: Infectious Disease

## 2009-01-30 ENCOUNTER — Ambulatory Visit: Payer: Self-pay | Admitting: Infectious Disease

## 2009-01-30 DIAGNOSIS — IMO0002 Reserved for concepts with insufficient information to code with codable children: Secondary | ICD-10-CM | POA: Insufficient documentation

## 2009-01-30 DIAGNOSIS — M1710 Unilateral primary osteoarthritis, unspecified knee: Secondary | ICD-10-CM

## 2009-01-30 LAB — CONVERTED CEMR LAB

## 2009-01-31 ENCOUNTER — Encounter: Payer: Self-pay | Admitting: Licensed Clinical Social Worker

## 2009-02-12 ENCOUNTER — Encounter: Payer: Self-pay | Admitting: Infectious Disease

## 2009-02-15 ENCOUNTER — Encounter (INDEPENDENT_AMBULATORY_CARE_PROVIDER_SITE_OTHER): Payer: Self-pay | Admitting: *Deleted

## 2009-02-28 ENCOUNTER — Telehealth (INDEPENDENT_AMBULATORY_CARE_PROVIDER_SITE_OTHER): Payer: Self-pay | Admitting: *Deleted

## 2009-03-26 ENCOUNTER — Telehealth: Payer: Self-pay | Admitting: Infectious Disease

## 2009-04-16 ENCOUNTER — Telehealth (INDEPENDENT_AMBULATORY_CARE_PROVIDER_SITE_OTHER): Payer: Self-pay | Admitting: *Deleted

## 2009-04-16 ENCOUNTER — Ambulatory Visit: Payer: Self-pay | Admitting: Infectious Disease

## 2009-04-16 LAB — CONVERTED CEMR LAB
ALT: 18 units/L (ref 0–53)
AST: 20 units/L (ref 0–37)
Albumin: 4.4 g/dL (ref 3.5–5.2)
Alkaline Phosphatase: 99 units/L (ref 39–117)
BUN: 11 mg/dL (ref 6–23)
Basophils Absolute: 0.1 10*3/uL (ref 0.0–0.1)
Basophils Relative: 1 % (ref 0–1)
CO2: 25 meq/L (ref 19–32)
Calcium: 9.8 mg/dL (ref 8.4–10.5)
Chloride: 103 meq/L (ref 96–112)
Cholesterol: 166 mg/dL (ref 0–200)
Creatinine, Ser: 0.81 mg/dL (ref 0.40–1.50)
Eosinophils Absolute: 0.2 10*3/uL (ref 0.0–0.7)
Eosinophils Relative: 2 % (ref 0–5)
GFR calc Af Amer: >60 mL/min (ref >60)
GFR calc non Af Amer: >60 mL/min (ref >60)
Glucose, Bld: 115 mg/dL — ABNORMAL HIGH (ref 70–99)
HCT: 46.2 % (ref 39.0–52.0)
HDL: 34 mg/dL — ABNORMAL LOW (ref >39)
HIV 1 RNA Quant: 145 copies/mL — ABNORMAL HIGH (ref <48)
HIV-1 RNA Quant, Log: 2.16 — ABNORMAL HIGH (ref <1.68)
Hemoglobin: 16 g/dL (ref 13.0–17.0)
LDL Cholesterol: 91 mg/dL (ref 0–99)
Lymphocytes Relative: 37 % (ref 12–46)
Lymphs Abs: 3.3 10*3/uL (ref 0.7–4.0)
MCHC: 34.6 g/dL (ref 30.0–36.0)
MCV: 96.3 fL (ref 78.0–100.0)
Monocytes Absolute: 0.9 10*3/uL (ref 0.1–1.0)
Monocytes Relative: 11 % (ref 3–12)
Neutro Abs: 4.4 10*3/uL (ref 1.7–7.7)
Neutrophils Relative %: 49 % (ref 43–77)
Platelets: 354 10*3/uL (ref 150–400)
Potassium: 4.4 meq/L (ref 3.5–5.3)
RBC: 4.8 M/uL (ref 4.22–5.81)
RDW: 14.1 % (ref 11.5–15.5)
Sodium: 138 meq/L (ref 135–145)
Total Bilirubin: 0.3 mg/dL (ref 0.3–1.2)
Total CHOL/HDL Ratio: 4.9
Total Protein: 8 g/dL (ref 6.0–8.3)
Triglycerides: 206 mg/dL — ABNORMAL HIGH (ref <150)
VLDL: 41 mg/dL — ABNORMAL HIGH (ref 0–40)
WBC: 8.9 10*3/uL (ref 4.0–10.5)

## 2009-04-20 ENCOUNTER — Telehealth: Payer: Self-pay | Admitting: Infectious Disease

## 2009-04-24 ENCOUNTER — Telehealth (INDEPENDENT_AMBULATORY_CARE_PROVIDER_SITE_OTHER): Payer: Self-pay | Admitting: *Deleted

## 2009-05-07 ENCOUNTER — Encounter (INDEPENDENT_AMBULATORY_CARE_PROVIDER_SITE_OTHER): Payer: Self-pay | Admitting: *Deleted

## 2009-05-07 ENCOUNTER — Ambulatory Visit: Payer: Self-pay | Admitting: Infectious Disease

## 2009-05-09 ENCOUNTER — Encounter: Payer: Self-pay | Admitting: Licensed Clinical Social Worker

## 2009-05-11 ENCOUNTER — Ambulatory Visit (HOSPITAL_COMMUNITY): Admission: RE | Admit: 2009-05-11 | Discharge: 2009-05-11 | Payer: Self-pay | Admitting: Infectious Disease

## 2009-05-23 ENCOUNTER — Telehealth: Payer: Self-pay | Admitting: Licensed Clinical Social Worker

## 2009-06-07 ENCOUNTER — Telehealth: Payer: Self-pay | Admitting: Infectious Disease

## 2009-06-12 ENCOUNTER — Encounter: Payer: Self-pay | Admitting: Infectious Disease

## 2009-06-15 ENCOUNTER — Telehealth: Payer: Self-pay | Admitting: Licensed Clinical Social Worker

## 2009-07-05 ENCOUNTER — Telehealth (INDEPENDENT_AMBULATORY_CARE_PROVIDER_SITE_OTHER): Payer: Self-pay | Admitting: *Deleted

## 2009-07-10 ENCOUNTER — Telehealth: Payer: Self-pay | Admitting: Infectious Disease

## 2009-07-11 ENCOUNTER — Telehealth (INDEPENDENT_AMBULATORY_CARE_PROVIDER_SITE_OTHER): Payer: Self-pay | Admitting: *Deleted

## 2009-08-09 ENCOUNTER — Encounter: Payer: Self-pay | Admitting: Infectious Disease

## 2009-08-13 ENCOUNTER — Telehealth: Payer: Self-pay | Admitting: Infectious Disease

## 2009-08-16 ENCOUNTER — Telehealth: Payer: Self-pay | Admitting: Infectious Disease

## 2009-08-16 ENCOUNTER — Emergency Department (HOSPITAL_COMMUNITY): Admission: EM | Admit: 2009-08-16 | Discharge: 2009-08-16 | Payer: Self-pay | Admitting: Emergency Medicine

## 2009-08-23 ENCOUNTER — Telehealth: Payer: Self-pay | Admitting: Infectious Disease

## 2009-09-03 ENCOUNTER — Telehealth (INDEPENDENT_AMBULATORY_CARE_PROVIDER_SITE_OTHER): Payer: Self-pay | Admitting: Licensed Clinical Social Worker

## 2009-09-03 ENCOUNTER — Ambulatory Visit: Payer: Self-pay | Admitting: Infectious Disease

## 2009-09-03 DIAGNOSIS — K270 Acute peptic ulcer, site unspecified, with hemorrhage: Secondary | ICD-10-CM | POA: Insufficient documentation

## 2009-09-03 DIAGNOSIS — M25511 Pain in right shoulder: Secondary | ICD-10-CM | POA: Insufficient documentation

## 2009-09-03 LAB — CONVERTED CEMR LAB
ALT: 12 units/L (ref 0–53)
AST: 16 units/L (ref 0–37)
Albumin: 4.5 g/dL (ref 3.5–5.2)
Alkaline Phosphatase: 99 units/L (ref 39–117)
BUN: 12 mg/dL (ref 6–23)
Basophils Absolute: 0.1 10*3/uL (ref 0.0–0.1)
Basophils Relative: 1 % (ref 0–1)
CO2: 25 meq/L (ref 19–32)
Calcium: 10 mg/dL (ref 8.4–10.5)
Chlamydia, Swab/Urine, PCR: NEGATIVE
Chloride: 102 meq/L (ref 96–112)
Cholesterol: 150 mg/dL (ref 0–200)
Creatinine, Ser: 0.95 mg/dL (ref 0.40–1.50)
Eosinophils Absolute: 0.2 10*3/uL (ref 0.0–0.7)
Eosinophils Relative: 2 % (ref 0–5)
GC Probe Amp, Urine: NEGATIVE
Glucose, Bld: 93 mg/dL (ref 70–99)
HCT: 49.4 % (ref 39.0–52.0)
HDL: 37 mg/dL — ABNORMAL LOW (ref >39)
HIV 1 RNA Quant: 161 copies/mL — ABNORMAL HIGH (ref <48)
HIV-1 RNA Quant, Log: 2.21 — ABNORMAL HIGH (ref <1.68)
Hemoglobin: 16.2 g/dL (ref 13.0–17.0)
LDL Cholesterol: 69 mg/dL (ref 0–99)
Lymphocytes Relative: 24 % (ref 12–46)
Lymphs Abs: 2.4 10*3/uL (ref 0.7–4.0)
MCHC: 32.8 g/dL (ref 30.0–36.0)
MCV: 98.6 fL (ref >=78.0)
Monocytes Absolute: 0.9 10*3/uL (ref 0.1–1.0)
Monocytes Relative: 9 % (ref 3–12)
Neutro Abs: 6.2 10*3/uL (ref 1.7–7.7)
Neutrophils Relative %: 63 % (ref 43–77)
Platelets: 358 10*3/uL (ref 150–400)
Potassium: 4.3 meq/L (ref 3.5–5.3)
RBC: 5.01 M/uL (ref 4.22–5.81)
RDW: 14.3 % (ref 11.5–15.5)
Sodium: 138 meq/L (ref 135–145)
Total Bilirubin: 0.4 mg/dL (ref 0.3–1.2)
Total CHOL/HDL Ratio: 4.1
Total Protein: 7.9 g/dL (ref 6.0–8.3)
Triglycerides: 218 mg/dL — ABNORMAL HIGH (ref <150)
VLDL: 44 mg/dL — ABNORMAL HIGH (ref 0–40)
WBC: 9.8 10*3/uL (ref 4.0–10.5)

## 2009-09-06 ENCOUNTER — Ambulatory Visit (HOSPITAL_COMMUNITY): Admission: RE | Admit: 2009-09-06 | Discharge: 2009-09-06 | Payer: Self-pay | Admitting: Infectious Disease

## 2009-09-10 ENCOUNTER — Telehealth (INDEPENDENT_AMBULATORY_CARE_PROVIDER_SITE_OTHER): Payer: Self-pay | Admitting: *Deleted

## 2009-09-12 ENCOUNTER — Encounter: Payer: Self-pay | Admitting: Infectious Disease

## 2009-09-18 ENCOUNTER — Telehealth (INDEPENDENT_AMBULATORY_CARE_PROVIDER_SITE_OTHER): Payer: Self-pay | Admitting: *Deleted

## 2009-09-27 ENCOUNTER — Telehealth: Payer: Self-pay | Admitting: Infectious Disease

## 2009-09-28 ENCOUNTER — Telehealth: Payer: Self-pay | Admitting: Infectious Disease

## 2009-10-04 ENCOUNTER — Ambulatory Visit: Payer: Self-pay | Admitting: Infectious Disease

## 2009-10-04 DIAGNOSIS — A4902 Methicillin resistant Staphylococcus aureus infection, unspecified site: Secondary | ICD-10-CM | POA: Insufficient documentation

## 2009-10-04 LAB — CONVERTED CEMR LAB
Cholesterol, target level: 200 mg/dL
HDL goal, serum: 40 mg/dL
LDL Goal: 130 mg/dL

## 2009-10-10 ENCOUNTER — Telehealth: Payer: Self-pay

## 2009-10-23 ENCOUNTER — Telehealth: Payer: Self-pay | Admitting: Infectious Disease

## 2009-10-24 ENCOUNTER — Telehealth: Payer: Self-pay | Admitting: Infectious Disease

## 2009-12-25 ENCOUNTER — Telehealth (INDEPENDENT_AMBULATORY_CARE_PROVIDER_SITE_OTHER): Payer: Self-pay | Admitting: *Deleted

## 2009-12-28 ENCOUNTER — Telehealth: Payer: Self-pay | Admitting: Infectious Disease

## 2009-12-28 ENCOUNTER — Telehealth (INDEPENDENT_AMBULATORY_CARE_PROVIDER_SITE_OTHER): Payer: Self-pay | Admitting: *Deleted

## 2010-01-02 ENCOUNTER — Ambulatory Visit: Payer: Self-pay | Admitting: Infectious Disease

## 2010-01-02 LAB — CONVERTED CEMR LAB
ALT: 15 units/L (ref 0–53)
AST: 17 units/L (ref 0–37)
Albumin: 3.9 g/dL (ref 3.5–5.2)
Alkaline Phosphatase: 100 units/L (ref 39–117)
BUN: 12 mg/dL (ref 6–23)
Basophils Absolute: 0.1 10*3/uL (ref 0.0–0.1)
Basophils Relative: 1 % (ref 0–1)
CO2: 24 meq/L (ref 19–32)
Calcium: 8.8 mg/dL (ref 8.4–10.5)
Chloride: 102 meq/L (ref 96–112)
Cholesterol: 140 mg/dL (ref 0–200)
Creatinine, Ser: 0.91 mg/dL (ref 0.40–1.50)
Eosinophils Absolute: 0.5 10*3/uL (ref 0.0–0.7)
Eosinophils Relative: 5 % (ref 0–5)
Glucose, Bld: 104 mg/dL — ABNORMAL HIGH (ref 70–99)
HCT: 48 % (ref 39.0–52.0)
HDL: 32 mg/dL — ABNORMAL LOW (ref >39)
HIV 1 RNA Quant: 148 copies/mL — ABNORMAL HIGH (ref <48)
HIV-1 RNA Quant, Log: 2.17 — ABNORMAL HIGH (ref <1.68)
Hemoglobin: 16.1 g/dL (ref 13.0–17.0)
LDL Cholesterol: 56 mg/dL (ref 0–99)
Lymphocytes Relative: 29 % (ref 12–46)
Lymphs Abs: 3.3 10*3/uL (ref 0.7–4.0)
MCHC: 33.5 g/dL (ref 30.0–36.0)
MCV: 94.7 fL (ref >=78.0)
Monocytes Absolute: 0.9 10*3/uL (ref 0.1–1.0)
Monocytes Relative: 8 % (ref 3–12)
Neutro Abs: 6.6 10*3/uL (ref 1.7–7.7)
Neutrophils Relative %: 58 % (ref 43–77)
Platelets: 342 10*3/uL (ref 150–400)
Potassium: 4 meq/L (ref 3.5–5.3)
RBC: 5.07 M/uL (ref 4.22–5.81)
RDW: 13.4 % (ref 11.5–15.5)
Sodium: 139 meq/L (ref 135–145)
Total Bilirubin: 0.3 mg/dL (ref 0.3–1.2)
Total CHOL/HDL Ratio: 4.4
Total Protein: 7 g/dL (ref 6.0–8.3)
Triglycerides: 262 mg/dL — ABNORMAL HIGH (ref <150)
VLDL: 52 mg/dL — ABNORMAL HIGH (ref 0–40)
WBC: 11.4 10*3/uL — ABNORMAL HIGH (ref 4.0–10.5)

## 2010-01-19 ENCOUNTER — Encounter: Payer: Self-pay | Admitting: Infectious Disease

## 2010-01-21 ENCOUNTER — Ambulatory Visit: Payer: Self-pay | Admitting: Infectious Disease

## 2010-02-19 ENCOUNTER — Encounter (INDEPENDENT_AMBULATORY_CARE_PROVIDER_SITE_OTHER): Payer: Self-pay | Admitting: *Deleted

## 2010-04-26 ENCOUNTER — Ambulatory Visit: Payer: Self-pay | Admitting: Internal Medicine

## 2010-04-26 DIAGNOSIS — L049 Acute lymphadenitis, unspecified: Secondary | ICD-10-CM | POA: Insufficient documentation

## 2010-05-02 ENCOUNTER — Telehealth: Payer: Self-pay | Admitting: Infectious Disease

## 2010-05-07 ENCOUNTER — Encounter (INDEPENDENT_AMBULATORY_CARE_PROVIDER_SITE_OTHER): Payer: Self-pay | Admitting: *Deleted

## 2010-05-27 ENCOUNTER — Telehealth: Payer: Self-pay | Admitting: Infectious Disease

## 2010-05-28 ENCOUNTER — Telehealth (INDEPENDENT_AMBULATORY_CARE_PROVIDER_SITE_OTHER): Payer: Self-pay | Admitting: *Deleted

## 2010-06-04 ENCOUNTER — Other Ambulatory Visit: Payer: Self-pay | Admitting: Internal Medicine

## 2010-06-04 ENCOUNTER — Ambulatory Visit: Payer: Self-pay | Admitting: Infectious Disease

## 2010-06-04 LAB — CONVERTED CEMR LAB
ALT: 16 units/L (ref 0–53)
AST: 18 units/L (ref 0–37)
Albumin: 4.5 g/dL (ref 3.5–5.2)
Alkaline Phosphatase: 113 units/L (ref 39–117)
BUN: 17 mg/dL (ref 6–23)
Basophils Absolute: 0.1 10*3/uL (ref 0.0–0.1)
Basophils Relative: 1 % (ref 0–1)
CO2: 24 meq/L (ref 19–32)
Calcium: 9.6 mg/dL (ref 8.4–10.5)
Chloride: 104 meq/L (ref 96–112)
Cholesterol: 165 mg/dL (ref 0–200)
Creatinine, Ser: 0.92 mg/dL (ref 0.40–1.50)
Eosinophils Absolute: 0.4 10*3/uL (ref 0.0–0.7)
Eosinophils Relative: 4 % (ref 0–5)
Glucose, Bld: 91 mg/dL (ref 70–99)
HCT: 50.4 % (ref 39.0–52.0)
HDL: 38 mg/dL — ABNORMAL LOW (ref >39)
HIV 1 RNA Quant: <48 copies/mL (ref <48)
HIV-1 RNA Quant, Log: <1.68 (ref <1.68)
Hemoglobin: 17.1 g/dL — ABNORMAL HIGH (ref 13.0–17.0)
LDL Cholesterol: 85 mg/dL (ref 0–99)
Lymphocytes Relative: 31 % (ref 12–46)
Lymphs Abs: 3.6 10*3/uL (ref 0.7–4.0)
MCHC: 33.9 g/dL (ref 30.0–36.0)
MCV: 96.4 fL (ref 78.0–100.0)
Monocytes Absolute: 0.9 10*3/uL (ref 0.1–1.0)
Monocytes Relative: 8 % (ref 3–12)
Neutro Abs: 6.4 10*3/uL (ref 1.7–7.7)
Neutrophils Relative %: 56 % (ref 43–77)
Platelets: 327 10*3/uL (ref 150–400)
Potassium: 4.1 meq/L (ref 3.5–5.3)
RBC: 5.23 M/uL (ref 4.22–5.81)
RDW: 14.2 % (ref 11.5–15.5)
Sodium: 138 meq/L (ref 135–145)
Total Bilirubin: 0.3 mg/dL (ref 0.3–1.2)
Total CHOL/HDL Ratio: 4.3
Total Protein: 7.8 g/dL (ref 6.0–8.3)
Triglycerides: 210 mg/dL — ABNORMAL HIGH (ref <150)
VLDL: 42 mg/dL — ABNORMAL HIGH (ref 0–40)
WBC: 11.5 10*3/uL — ABNORMAL HIGH (ref 4.0–10.5)

## 2010-06-20 ENCOUNTER — Ambulatory Visit: Payer: Self-pay | Admitting: Infectious Disease

## 2010-06-20 DIAGNOSIS — S91309A Unspecified open wound, unspecified foot, initial encounter: Secondary | ICD-10-CM | POA: Insufficient documentation

## 2010-07-24 ENCOUNTER — Telehealth: Payer: Self-pay | Admitting: Infectious Disease

## 2010-08-26 ENCOUNTER — Telehealth: Payer: Self-pay | Admitting: Infectious Disease

## 2010-09-13 ENCOUNTER — Encounter (INDEPENDENT_AMBULATORY_CARE_PROVIDER_SITE_OTHER): Payer: Self-pay | Admitting: *Deleted

## 2010-12-05 ENCOUNTER — Encounter: Payer: Self-pay | Admitting: Infectious Disease

## 2010-12-31 NOTE — Miscellaneous (Signed)
Summary: clincial update/ryan white  Clinical Lists Changes

## 2010-12-31 NOTE — Miscellaneous (Signed)
Summary: clinical update/ryan white  Clinical Lists Changes  Observations: Added new observation of INCOMESOURCE: NONE (05/01/2008 10:23) Added new observation of HOUSEINCOME: 0  (05/01/2008 10:23) Added new observation of FINASSESSDT: 05/01/2008  (05/01/2008 10:23) Added new observation of YEARLYEXPEN: 0  (05/01/2008 10:23) Added new observation of INFECTDIS MD: Daiva Eves  (05/01/2008 10:23)

## 2010-12-31 NOTE — Miscellaneous (Signed)
Summary: clinical update/ryan white [facility] appr til 03/01/11  Clinical Lists Changes  Observations: Added new observation of AIDSDAP: Yes 2011 (02/19/2010 16:06)

## 2010-12-31 NOTE — Miscellaneous (Signed)
Summary: Medication Contract  Medication Contract   Imported By: Florinda Marker 10/08/2009 13:59:27  _____________________________________________________________________  External Attachment:    Type:   Image     Comment:   External Document

## 2010-12-31 NOTE — Progress Notes (Signed)
Summary: phone note-pain med-/TY  Phone Note Call from Patient   Caller: Patient Call For: Paulette Blanch Dam MD Summary of Call: Patient states that his knees are getting worse, and the pain has increased he wants a RX for a pain medication. He said vicodin would be fine since that is what is already on his chart. Initial call taken by: Starleen Arms CMA,  March 26, 2009 2:13 PM  Follow-up for Phone Call        That is fine Follow-up by: Acey Lav MD,  March 26, 2009 2:30 PM      Prescriptions: VICODIN 5-500 MG TABS (HYDROCODONE-ACETAMINOPHEN) take one to two tablets twice daily for pain  #60 x 0   Entered by:   Starleen Arms CMA   Authorized by:   Acey Lav MD   Signed by:   Starleen Arms CMA on 03/26/2009   Method used:   Telephoned to ...       [facility] (retail)       [address]       [place], [place]  [address]       Ph: [phone]       Fax: [phone]   RxID:   [id number]

## 2010-12-31 NOTE — Progress Notes (Signed)
Summary: Patient assist med arrived for a 3 months supply  Phone Note Refill Request        Prescriptions: NEURONTIN 300 MG  CAPS (GABAPENTIN) Take 1 capsule by mouth at bedtime  #100 x 0   Entered by:   Paulo Fruit  BS,CPht II,MPH   Authorized by:   Acey Lav MD   Signed by:   Paulo Fruit  BS,CPht II,MPH on 02/28/2009   Method used:   Samples Given   RxID:   [id number]   Patient Assist Medication Verification: Medication: Neurontin 300mg  Lot# [id number] Exp Date:Oct 12 Tech approval:MLD Call placed to patient with message that assistance medications are ready for pick-up. Left message. Paulo Fruit  BS,CPht II,MPH  February 28, 2009 3:59 PM

## 2010-12-31 NOTE — Progress Notes (Signed)
Summary: refill/mld  Phone Note From Pharmacy   Caller: [facility] [address] [facility] Reason for Call: Needs renewal Summary of Call: Received a voicemail message requesting new prescriptions for patient's Isentress 400mg   and Omeprazole.  They would like someone to call [phone] or fax to [phone]. Initial call taken by: Paulo Fruit  BS,CPht II,MPH,  May 28, 2010 9:47 AM    Prescriptions: ISENTRESS 400 MG  TABS (RALTEGRAVIR POTASSIUM) Take 1 tablet by mouth two times a day  #60 x 11   Entered by:   Paulo Fruit  BS,CPht II,MPH   Authorized by:   Acey Lav MD   Signed by:   Paulo Fruit  BS,CPht II,MPH on 05/28/2010   Method used:   Electronically to        [facility] (retail)       [address]       [place], [place]  [address]       Ph: [phone]       Fax: [phone]   RxID:   [id number] PRILOSEC OTC 20 MG  TBEC (OMEPRAZOLE MAGNESIUM) take two tablets once daily  #60 x 11   Entered by:   Paulo Fruit  BS,CPht II,MPH   Authorized by:   Acey Lav MD   Signed by:   Paulo Fruit  BS,CPht II,MPH on 05/28/2010   Method used:   Electronically to        [facility] (retail)       [address]       [place], [place]  [address]       Ph: [phone]       Fax: [phone]   RxID:   [id number]  Paulo Fruit  BS,CPht II,MPH  May 28, 2010 9:48 AM

## 2010-12-31 NOTE — Progress Notes (Signed)
Summary: Vicodin refill request  Phone Note Call from Patient   Summary of Call: Pt requesting refill of Vicodin due to pain of knees and bilat foot pain Initial call taken by: Tomasita Morrow RN,  June 07, 2009 4:51 PM  Follow-up for Phone Call        ok per Dr Daiva Eves for refill of hydrocodone. Follow-up by: Tomasita Morrow RN,  June 07, 2009 5:14 PM      Prescriptions: VICODIN 5-500 MG TABS (HYDROCODONE-ACETAMINOPHEN) one tablet up to four times daily  #120 x 0   Entered by:   Tomasita Morrow RN   Authorized by:   Acey Lav MD   Signed by:   Paulette Blanch Dam MD on 06/07/2009   Method used:   Telephoned to ...       [facility] (retail)       [address]       [place], [place]  [address]       Ph: [phone]       Fax: [phone]   RxID:   [id number]

## 2010-12-31 NOTE — Assessment & Plan Note (Signed)
Summary: f/u [mkj]   Visit Type:  Follow-up Primary Provider:  Acey Lav, M.D.  CC:  follow-up visit and Lipid Management.  History of Present Illness: 55 yo with HIV with undetectable Viral load and healthy CD4 count. He was seen last month and found to have swollen lymph node on right side of his neck. He was given keflex by Dr Philipp Deputy for this with essential resolution of his lymph node enlargement.  He did injry his ankle with laceration from weed eeater but it is now healing well.  Lipid Management History:      Positive NCEP/ATP III risk factors include male age 38 years old or older and HDL cholesterol less than 40.  Negative NCEP/ATP III risk factors include non-tobacco-user status, non-hypertensive, no ASHD (atherosclerotic heart disease), no prior stroke/TIA, no peripheral vascular disease, and no history of aortic aneurysm.    Problems Prior to Update: 1)  Cervical Lymphadenitis  (ICD-683) 2)  Methicillin Resistant Staphylococcus Aureus Infection  (ICD-041.12) 3)  Peptic Ulcer, Acute, Hemorrhage  (ICD-533.00) 4)  Shoulder Pain, Right  (ICD-719.41) 5)  Inadequate Material Resources  (ICD-V60.2) 6)  Visual Acuity, Decreased  (ICD-369.9) 7)  Foot Pain, Bilateral  (ICD-729.5) 8)  Degenerative Joint Disease, Knees, Bilateral  (ICD-715.96) 9)  Need Prophylactic Vaccination&inoculation Flu  (ICD-V04.81) 10)  Sleep Apnea  (ICD-780.57) 11)  Anal and Rectal Polyp  (ICD-569.0) 12)  Risk of Sleep Apnea  (ICD-V12.59) 13)  Polycythemia  (ICD-289.0) 14)  Screening Colorectal-cancer  (ICD-V76.51) 15)  Gerd  (ICD-530.81) 16)  Hyperlipidemia  (ICD-272.4) 17)  Abscess, Tooth  (ICD-522.5) 18)  Insomnia  (ICD-780.52) 19)  Peripheral Neuropathy  (ICD-356.9) 20)  Mood Disorder in Conditions Classified Elsewhere  (ICD-293.83) 21)  Preventive Health Care  (ICD-V70.0) 22)  Disc Disease, Cervical  (ICD-722.4) 23)  Tinnitus  (ICD-388.30) 24)  Calculus of Kidney  (ICD-592.0) 25)  Pud   (ICD-533.90) 26)  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 Take Two Tablets Once Daily 2)  Intelence 100 Mg  Tabs (Etravirine) .... Take Two Tablets Twice Daily By Mouth 3)  Truvada 200-300 Mg  Tabs (Emtricitabine-Tenofovir) .... Take 1 Tablet By Mouth Once A Day 4)  Isentress 400 Mg  Tabs (Raltegravir Potassium) .... Take 1 Tablet By Mouth Two Times A Day 5)  Gabapentin 300 Mg Caps (Gabapentin) .... Take One Tablet Three Times A Day 6)  Oxycodone Hcl 5 Mg Tabs (Oxycodone Hcl) .... Take One Tablet As Needed Up To Four Times Daily For Breakthrough Pain 7)  Bactroban Nasal 2 % Oint (Mupirocin Calcium) .... Apply To Inside of Nares Twice Daily For  7 Days 8)  Ms Contin 15 Mg Xr12h-Tab (Morphine Sulfate) .... Take 1 Tablet By Mouth Two Times A Day  Current Medications (verified): 1)  Prilosec Otc 20 Mg  Tbec (Omeprazole Magnesium) .... Take Two Tablets Once Daily 2)  Intelence 100 Mg  Tabs (Etravirine) .... Take Two Tablets Twice Daily By Mouth 3)  Truvada 200-300 Mg  Tabs (Emtricitabine-Tenofovir) .... Take 1 Tablet By Mouth Once A Day 4)  Isentress 400 Mg  Tabs (Raltegravir Potassium) .... Take 1 Tablet By Mouth Two Times A Day 5)  Gabapentin 300 Mg Caps (  Gabapentin) .... Take One Tablet Three Times A Day 6)  Oxycodone Hcl 5 Mg Tabs (Oxycodone Hcl) .... Take One Tablet As Needed Up To Four Times Daily For Breakthrough Pain Fill On July 27th 7)  Bactroban Nasal 2 % Oint (Mupirocin Calcium) .... Apply To Inside of Nares Twice Daily For  7 Days 8)  Ms Contin 15 Mg Xr12h-Tab (Morphine Sulfate) .... Take 1 Tablet By Mouth Two Times A Day May Be Filled On July 27th  Allergies: 1)  ! Aleve 2)  !  Nsaids   Preventive Screening-Counseling & Management  Alcohol-Tobacco     Alcohol drinks/day: 0     Smoking Status: quit     Smoking Cessation Counseling: no     Packs/Day: 10 ciggs     Year Started: 30 years ago     Year Quit: 03/2008     Pack years: 1ppd     Passive Smoke Exposure: no  Caffeine-Diet-Exercise     Caffeine use/day: coffee,tea and sodas sometimes     Does Patient Exercise: yes     Type of exercise: walking     Exercise (avg: min/session): 30-60     Times/week: 3  Safety-Violence-Falls     Seat Belt Use: yes   Current Allergies (reviewed today): ! ALEVE ! 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 Family History of Colon Polyps:  Social History: Last updated: 01/30/2009 Single  Alcohol use-no Drug use-no former alcohol and drug abuser quit 1998 drink at least 3 large coffees a day sometimes more Former Smoker  Risk Factors: Alcohol Use: 0 (06/20/2010) Caffeine Use: coffee,tea and sodas sometimes (06/20/2010) Exercise: yes (06/20/2010)  Risk Factors: Smoking Status: quit (06/20/2010) Packs/Day: 10 ciggs (06/20/2010) Passive Smoke Exposure: no (06/20/2010)  Family History: Reviewed history from 04/21/2008 and no changes required. No early CAD. Family History of Colon Polyps:  Social History: Reviewed history from 01/30/2009 and no changes required. Single  Alcohol use-no Drug use-no former alcohol and drug abuser quit 1998 drink at least 3 large coffees a day sometimes  more Former Smoker  Review of Systems  The patient denies anorexia, fever, weight loss, weight gain, vision loss, decreased hearing, hoarseness, chest pain, syncope, dyspnea on exertion, peripheral edema, prolonged cough, headaches, hemoptysis, abdominal pain, melena, hematochezia, severe indigestion/heartburn, hematuria, incontinence, genital sores, muscle weakness, suspicious skin lesions, transient blindness, difficulty walking, depression, unusual weight change, abnormal bleeding, and enlarged lymph nodes.    Vital Signs:  Patient profile:   55 year old male Height:      70 inches (177.80 cm) Weight:      215.4 pounds (97.91 kg) BMI:     31.02 Temp:     98.0 degrees F (36.67 degrees C) Pulse rate:   73 / minute BP sitting:   122 / 85  (left arm)  Vitals Entered By: Baxter Hire) (June 20, 2010 9:03 AM) CC: follow-up visit, Lipid Management Pain Assessment Patient in pain? yes     Location: feet,hands and shoulders Intensity: 7 Type: aching Onset of pain  constant but worse at night Nutritional Status BMI of > 30 = obese  Nutritional Status Detail appetite is good per patient  Have you ever been in a relationship where you felt threatened, hurt or afraid?No   Does patient need assistance? Functional Status Self care Ambulation Normal   Physical Exam  General:  alert, well-developed, well-nourished, and well-hydrated.   Head:  normocephalic and atraumatic.   Eyes:  vision grossly intact, pupils equal, pupils round, and pupils reactive to light.   Ears:  R ear normal.   Nose:  no external deformity, no external erythema, and no nasal discharge.   Mouth:  pharynx pink and moist.   Neck:  lymphadenopathy appears resolved Lungs:  normal respiratory effort, no crackles, and no wheezes.   Heart:  normal rate, regular rhythm, no murmur, no gallop, and no rub.   Abdomen:  no distention.  soft, non-tender, no masses, and no guarding.   Msk:  he is able to rotate  shoulder upwards without difficulty and in fact with relief of pain Extremities:  No clubbing, cyanosis, edema,  Neurologic:  alert & oriented X3.  strength intact though he is slightly limited in movement in his shoulder Skin:  no rashes. healing scar on right ankle Psych:  Oriented X3.  memory intact for recent and remote and normally interactive.          Medication Adherence: 06/20/2010   Adherence to medications reviewed with patient. Counseling to provide adequate adherence provided   Prevention For Positives: 06/20/2010   Safe sex practices discussed with patient. Condoms offered.   Education Materials Provided: 06/20/2010 Safe sex practices discussed with patient. Condoms offered.                          Impression & Recommendations:  Problem # 1:  HIV DISEASE (ICD-042)  Excellent control! Diagnostics Reviewed:  HIV: CDC-defined AIDS (01/19/2010)   CD4: 640 (01/03/2010)   WBC: 11.5 (06/04/2010)   Hgb: 17.1 (06/04/2010)   HCT: 50.4 (06/04/2010)   Platelets: 327 (06/04/2010) HIV-1 RNA: <48 copies/mL (06/04/2010)   HBSAg: No (01/25/2007)  Orders: Est. Patient Level IV ([address])  Problem # 2:  CERVICAL LYMPHADENITIS (ICD-683) Assessment: Improved  resolved  Orders: Est. Patient Level IV ([address])  Problem # 3:  PERIPHERAL NEUROPATHY (ICD-356.9) refilled gabapentin  Problem # 4:  DISC DISEASE, CERVICAL (ICD-722.4)  filled script for his narcotics for chronic pain  Orders: Est. Patient Level IV ([address])  Problem # 5:  HYPERLIPIDEMIA (ICD-272.4)  has ben on gemfibrozil for TG in past. Will recheck lipids at next visit  Orders: Est. Patient Level IV ([address])  Problem # 6:  LACERATION, FOOT (ICD-892.0)  healing well  Orders: Est. Patient Level IV ([address])  Medications Added to Medication List This Visit: 1)  Oxycodone Hcl 5 Mg Tabs (Oxycodone hcl) .... Take one tablet as needed up to four times daily for breakthrough pain fill on july 27th 2)  Ms Contin 15  Mg Xr12h-tab (Morphine sulfate) .... Take 1 tablet by mouth two times a day may be filled on july 27th  Other Orders: Future Orders: T-CD4SP ([facility]) (CD4SP) ... 10/14/2010 T-HIV Viral Load [phone]) ... 10/14/2010 T-CBC w/Diff ([id number]) ... 10/14/2010 T-Comprehensive Metabolic Panel [id number]) ... 10/14/2010  Lipid Assessment/Plan:      Based on NCEP/ATP III, the patient's risk factor category is "0-1 risk factors".  The patient's lipid goals are as follows: Total cholesterol goal is 200; LDL cholesterol goal is 130; HDL cholesterol goal is 40; Triglyceride goal is 150.  His LDL  cholesterol goal has been met.       Patient Instructions: 1)  Please make fu appt in November or early December Prescriptions: GABAPENTIN 300 MG CAPS (GABAPENTIN) take one tablet three times a day  #270 x 4   Entered and Authorized by:   Acey Lav MD   Signed by:   Paulette Blanch Dam MD on 06/20/2010   Method used:   Print then Give to Patient   RxID:   [id number] MS CONTIN 15 MG XR12H-TAB (MORPHINE SULFATE) Take 1 tablet by mouth two times a day may be filled on July 27th  #62 x 0   Entered and Authorized by:   Acey Lav MD   Signed by:   Paulette Blanch Dam MD on 06/20/2010   Method used:   Print then Give to Patient   RxID:   [id number] OXYCODONE HCL 5 MG TABS (OXYCODONE HCL) take one tablet as needed up to four times daily for breakthrough pain fill on July 27th  #60 x 0   Entered and Authorized by:   Acey Lav MD   Signed by:   Paulette Blanch Dam MD on 06/20/2010   Method used:   Print then Give to Patient   RxID:   [id number]

## 2010-12-31 NOTE — Progress Notes (Signed)
Summary: PAIN MED REFILL/ty  Phone Note Call from Patient   Reason for Call: Refill Medication Summary of Call: PATIENT CALLED STATING THAT THE PHARMACY WOULD NOT FILL THE MEDICATION BECAUSE IT IS LESS THAN 30 DAYS SINCE THE LAST ONE AND ITS FOR THE SAME MEDICATION. HE WANTS TO KNOW IF YOU COULD WRITE IT FOR HYDROCODONE 7.5 SO IT CAN BE CALLED IN. Initial call taken by: Starleen Arms CMA,  September 28, 2009 9:58 AM  Follow-up for Phone Call        I called the pharmacy and they will now fill the prescription I wrote for oxycodone to tide him over until his appt with me on Thursday. Thanks! Follow-up by: Acey Lav MD,  September 28, 2009 4:46 PM

## 2010-12-31 NOTE — Progress Notes (Signed)
Summary: Refill reorder via Pfizer pap  Refill reorder of Neurontin via Pf[facility] pap. Medication will arrive withing 7-10 business days from Dec 21, 2009. Pt's ID [id number]. The confirmation number is [id number]. Paulo Fruit  BS,CPht II,MPH  December 25, 2009 3:55 PM

## 2010-12-31 NOTE — Assessment & Plan Note (Signed)
Summary: est/ck/meds/cfb   PCP:  Acey Lav, M.D.  Chief Complaint:  feet pain.  History of Present Illness: 55 year old with HIV  cd4 510 most recent viral load of 233 on intelence, raltegravir, truvada. He claims to have missed only one or two doses in the past several months. He is not sexually active. He is currently on a grapefruit diet which consists over low carb diet plus 1.5 to 2 grapefruits a day. I checked drug interactions and grapefruit can lower raltegravir levels. Advised him to stop the grapefruit. He contnues to have evidence of sleep apnea with daytime somnolence, waking up at night gasping for air.    Current Allergies (reviewed today): ! ALEVE ! NSAIDS   Family History:    Reviewed history from 04/21/2008 and no changes required:       No early CAD.       Family History of Colon Polyps:  Social History:    Reviewed history from 04/21/2008 and no changes required:       Single       Current Smoker       Alcohol use-no       Drug use-no       former alcohol and drug abuser quit 1998       drink at least 3 large coffees a day sometimes more   Risk Factors: Tobacco use:  quit    Year quit:  03/2008 Drug use:  no Caffeine use:  2 drinks per day Alcohol use:  no Exercise:  no Seatbelt use:  100 %  Colonoscopy History:    Date of Last Colonoscopy:  05/10/2008   Review of Systems  The patient denies anorexia, fever, hoarseness, chest pain, syncope, dyspnea on exertion, peripheral edema, prolonged cough, headaches, hemoptysis, abdominal pain, melena, and hematochezia.    Flu Vaccine Consent Questions     Do you have a history of severe allergic reactions to this vaccine? no    Any prior history of allergic reactions to egg and/or gelatin? no    Do you have a sensitivity to the preservative Thimersol? no    Do you have a past history of Guillan-Barre Syndrome? no    Do you currently have an acute febrile illness? no    Have you ever had a severe  reaction to latex? no    Vaccine information given and explained to patient? yes    Are you currently pregnant? no    Lot Number:[id number]   Exp Date:05/30/2009   Site Given  right Deltoid IM .sign  Vital Signs:  Patient Profile:   55 Years Old Male Height:     70 inches (177.80 cm) Weight:      232.44 pounds (105.65 kg) BMI:     33.47 Temp:     96.9 degrees F (36.06 degrees C) oral Pulse rate:   82 / minute BP sitting:   122 / 85  (left arm)  Pt. in pain?   no    Location:   feet  Vitals Entered By: Starleen Arms CMA (October 16, 2008 11:00 AM)              Is Patient Diabetic? No Nutritional Status BMI of > 30 = obese Nutritional Status Detail nl  Have you ever been in a relationship where you felt threatened, hurt or afraid?No   Does patient need assistance? Functional Status Self care Ambulation Normal     Physical Exam  General:     alert  and overweight-appearing.   Head:     normocephalic and atraumatic.   Eyes:     vision grossly intact, pupils equal, pupils round, and pupils reactive to light.   Ears:     no external hearing aids in. Nose:     no external deformity.   Lungs:     normal respiratory effort, no crackles, and no wheezes.   Heart:     normal rate, regular rhythm, no murmur, no gallop, and no rub.   Abdomen:     soft, non-tender, normal bowel sounds, and no distention.      Impression & Recommendations:  Problem # 1:  HIV DISEASE (ICD-042) Assessment: Unchanged NOt pefrectly suppressed. I have advised AGAINST further grapefruit ingestion which could lower raltegravir levels. His updated medication list for this problem includes:    Intelence 100 Mg Tabs (Etravirine) .Marland Kitchen... Take two tablets twice daily by mouth    Truvada 200-300 Mg Tabs (Emtricitabine-tenofovir) .Marland Kitchen... Take 1 tablet by mouth once a day    Isentress 400 Mg Tabs (Raltegravir potassium) .Marland Kitchen... Take 1 tablet by mouth two times a day  Orders: Est. Patient Level IV  (81191)   Problem # 2:  SLEEP APNEA (ICD-780.57) HE REALLY NEEDS SLEEP STUDY, DUE TO LACK OF INSURANCE MAY HAVE PROBLEMS BUT NEEDS ONE. WOULD SEND TO [facility] IF CANNOT BE DONE HERE IN [place]  Problem # 3:  POLYCYTHEMIA (ICD-289.0) LIKELY DUE TO SLEEP APNEA, SMOKING Orders: Est. Patient Level IV (47829)   Problem # 4:  HYPERLIPIDEMIA (ICD-272.4) recheck lipids as he continues to lose weight. His updated medication list for this problem includes:    Fenofibrate 54 Mg Tabs (Fenofibrate) .Marland Kitchen... Take 1 tablet by mouth once daily   Problem # 5:  GERD (ICD-530.81)  His updated medication list for this problem includes:    Prilosec Otc 20 Mg Tbec (Omeprazole magnesium) .Marland Kitchen... Take two tablets once daily   Problem # 6:  PREVENTIVE HEALTH CARE (ICD-V70.0) update vaccines  Other Orders: Admin 1st Vaccine (56213) Flu Vaccine 38yrs + (08657) Sleep Disorder Referral (Sleep Disorder)  Future Orders: T-HIV Viral Load ([id number]) ... 02/12/2009 T-CD4 ([id number]) ... 02/12/2009 T-Comprehensive Metabolic Panel [phone]) ... 02/12/2009 T-Lipid Profile [phone]) ... 02/12/2009 T-CBC w/Diff ([id number]) ... 02/12/2009    Influenza Vaccine (to be given today)     Patient Instructions: 1)  I would strongly encourage you to stop the grapefruit diet as grapefruit can lower the effective levels of some of your anti-virals and could potentially lead to resistance. 2)  I commend you on your weight loss 3)  Please make return appt with Dr. Daiva Eves in four months. 4)  We will check fasting lipids again at that time. 5)  You need a sleep study. 6)  YOu need a pill box to help with your meds   ]

## 2010-12-31 NOTE — Progress Notes (Signed)
Summary: Patient wanting to change pain med/TY  Phone Note Call from Patient   Caller: Patient Summary of Call: Patient stating that he doesn't want to take the percocet anymore(???) He wants a new rx for hydrocodone(vicodin). Patient states that he still has some percocet left just doesn't want to take it anymore. Initial call taken by: Starleen Arms CMA,  August 23, 2009 9:22 AM  Follow-up for Phone Call        I am confused. He was taking vicodin which he said didnt work and I then wrote script for percocet ,  and now he wants to go back to viccodin?? Did he come fill the percocet script I wrote last week? Why doesnt he like the percocet? is it making him too drowsy. I dont mind chaing him back to vicodin but we need some clarification as to what is wrong here. Follow-up by: Acey Lav MD,  August 23, 2009 9:40 AM  Additional Follow-up for Phone Call Additional follow up Details #1::        Patient states that he took 4-5 a day of percocet last week, and now the pain had leveled off and he doesn't need that strength anymore. He said he didnt want to be dependent on percocet, and he couldnt come up here every month for the percocet. He knows we can call in Vicodin. He says he wants this for October. Additional Follow-up by: Starleen Arms CMA,  August 23, 2009 9:46 AM    Additional Follow-up for Phone Call Additional follow up Details #2::     thats fine w'll change him back to vicodin for October Follow-up by: Paulette Blanch Dam MD,  August 23, 2009 2:10 PM  New/Updated Medications: VICODIN 5-500 MG TABS (HYDROCODONE-ACETAMINOPHEN) take one to two tablets up to three times a day as needed for pain Prescriptions: VICODIN 5-500 MG TABS (HYDROCODONE-ACETAMINOPHEN) take one to two tablets up to three times a day as needed for pain  #80 x 0   Entered and Authorized by:   Acey Lav MD   Signed by:   Paulette Blanch Dam MD on 08/23/2009   Method used:    Printed then faxed to ...       [facility] [address] [facility] (retail)       [address]       [place], [place]  [address]       Ph: [phone]       Fax: [phone]   RxID:   [id number]

## 2010-12-31 NOTE — Progress Notes (Signed)
Summary: Patient assistance med arrived via [employer] 3 month supply  Phone Note Refill Request      Prescriptions: GABAPENTIN 300 MG CAPS (GABAPENTIN) take one tablet three times a day  #300 x 0   Entered by:   Paulo Fruit  BS,CPht II,MPH   Authorized by:   Acey Lav MD   Signed by:   Paulo Fruit  BS,CPht II,MPH on 09/18/2009   Method used:   Samples Given   RxID:   [id number]   Patient Assist Medication Verification: Medication: Neurontin 300mg  Lot# [id number] Exp Date:Mar 13 Tech approval:MLD Call placed to patient with message that assistance medications are ready for pick-up. Paulo Fruit  BS,CPht II,MPH  September 18, 2009 10:13 AM

## 2010-12-31 NOTE — Progress Notes (Signed)
Summary: phone note-wants pain med  Phone Note Call from Patient   Caller: Patient Call For: Acey Lav MD Reason for Call: Referral Summary of Call: Patient requesting refill  for  hydrocodone for pain, was filled last month. Initial call taken by: Starleen Arms CMA,  July 10, 2009 12:17 PM  Follow-up for Phone Call        Fine to fill. Should be able to send electronically, or fac since it is hydrocodone Follow-up by: Acey Lav MD,  July 10, 2009 2:11 PM    Prescriptions: VICODIN 5-500 MG TABS (HYDROCODONE-ACETAMINOPHEN) one tablet up to four times daily  #120 x 2   Entered and Authorized by:   Acey Lav MD   Signed by:   Paulette Blanch Dam MD on 07/10/2009   Method used:   Printed then faxed to ...       [facility] [address] [facility]ail[facility]       [address]       [place], [place]  [address]       Ph: [phone]       Fax: [phone]   RxID:   [id number]

## 2010-12-31 NOTE — Assessment & Plan Note (Signed)
Summary: resfrom am/kam   Primary Provider:  Acey Lav, M.D.  CC:  f/u and Depression.  History of Present Illness: 55 year old with HIV  on  raltegravir, etravirine, truvada with relatively healthy virological supppression and immune reconstitutions. He had developed  severe pain in  back of arm since August without numbness, in elbow in shoulder blade. Pain has been  8/10 in severity. Pain has been constant dull throbbing.Marland Kitchen He also now hds  numbness in his fingers in his right arm for 2 days. (ulnar nerve distribution). I did mRI of the shoulder and repeated MRI of the C spine (where he has had 2 C spine surgeries) . I referred him to Dorthula Nettles who reviewed the patients case with me . Per Dr. Dion Saucier exam and films are NOT consistent with shoulder pathology but are quite typical for cervical spine disease. He [facility] Dr. Dutch Quint at [facility], who felt no role for neurosurgery at this pt in time but pt could get steroid injections.  He says his mood is up and he has weaned himself off of his wellbutrin.He no longer smokes. He continues to have some neuropathic pain and we refilled his gabapentin adn his rx narcotics. He endoreses missing a week of ARV due to nausea and vomiting and acute illness a few weeks ago. Otherwise has not missed meds.  Depression History:      The patient denies a depressed mood most of the day and a diminished interest in his usual daily activities.        The patient denies that he feels like life is not worth living, denies that he wishes that he were dead, and denies that he has thought about ending his life.        Preventive Screening-Counseling & Management  Alcohol-Tobacco     Alcohol drinks/day: 0     Smoking Status: quit     Smoking Cessation Counseling: no     Packs/Day: 10 ciggs     Year Started: 30 years ago     Year Quit: 03/2008     Pack years: 1ppd     Passive Smoke Exposure: no  Caffeine-Diet-Exercise     Caffeine use/day: 2     Does Patient  Exercise: yes     Type of exercise: walking     Times/week: 3      Drug Use:  no.     Current Allergies (reviewed today): ! ALEVE ! 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 Family History of Colon Polyps:  Social History: Last updated: 01/30/2009 Single  Alcohol use-no Drug use-no former alcohol and drug abuser quit 1998 drink at least 3 large coffees a day sometimes more Former Smoker  Risk Factors: Alcohol Use: 0 (01/21/2010) Caffeine Use: 2 (01/21/2010) Exercise: yes (01/21/2010)  Risk Factors: Smoking Status: quit (01/21/2010) Packs/Day: 10 ciggs (01/21/2010) Passive Smoke Exposure: no (01/21/2010)  Problems Prior to Update: 1)  Methicillin Resistant Staphylococcus Aureus Infection  (ICD-041.12) 2)  Peptic Ulcer, Acute, Hemorrhage  (ICD-533.00) 3)  Shoulder Pain, Right  (ICD-719.41) 4)  Inadequate Material Resources  (ICD-V60.2) 5)  Visual Acuity,  Decreased  (ICD-369.9) 6)  Foot Pain, Bilateral  (ICD-729.5) 7)  Degenerative Joint Disease, Knees, Bilateral  (ICD-715.96) 8)  Need Prophylactic Vaccination&inoculation Flu  (ICD-V04.81) 9)  Sleep Apnea  (ICD-780.57) 10)  Anal and Rectal Polyp  (ICD-569.0) 11)  Risk of Sleep Apnea  (ICD-V12.59) 12)  Polycythemia  (ICD-289.0) 13)  Screening Colorectal-cancer  (ICD-V76.51) 14)  Gerd  (ICD-530.81) 15)  Hyperlipidemia  (ICD-272.4) 16)  Abscess, Tooth  (ICD-522.5) 17)  Insomnia  (ICD-780.52) 18)  Peripheral Neuropathy  (ICD-356.9) 19)  Mood Disorder in  Conditions Classified Elsewhere  (ICD-293.83) 20)  Preventive Health Care  (ICD-V70.0) 21)  Disc Disease, Cervical  (ICD-722.4) 22)  Tinnitus  (ICD-388.30) 23)  Calculus of Kidney  (ICD-592.0) 24)  Pud  (ICD-533.90) 25)  Neural Hearing Loss Bilateral  (ICD-389.12) 26)  Conductive Hearing Loss Bilateral  (ICD-389.06) 27)  Other Specified Forms of Hearing Loss  (ICD-389.8) 28)  Disorder, Depressive Nec  (ICD-311) 29)  Tobacco User  (ICD-305.1) 30)  HIV Disease  (ICD-042)  Medications Prior to Update: 1)  Prilosec Otc 20 Mg  Tbec (Omeprazole Magnesium) .... Take Two Tablets Once Daily 2)  Intelence 100 Mg  Tabs (Etravirine) .... Take Two Tablets Twice Daily By Mouth 3)  Truvada 200-300 Mg  Tabs (Emtricitabine-Tenofovir) .... Take 1 Tablet By Mouth Once A Day 4)  Isentress 400 Mg  Tabs (Raltegravir Potassium) .... Take 1 Tablet By Mouth Two Times A Day 5)  Neurontin 300 Mg  Caps (Gabapentin) .... Take 1 Capsule By Mouth At Bedtime 6)  Wellbutrin Sr 150 Mg  Tb12 (Bupropion Hcl) .... Take 1 Tablet By Mouth Two Times A Day 7)  Gabapentin 300 Mg Caps (Gabapentin) .... Take One Tablet Three Times A Day 8)  Oxycodone Hcl 5 Mg Tabs (Oxycodone Hcl) .... Take One Tablet As Needed Op To Four Times Daily For Breakthrough Pain 9)  Hibiclens 4 % Liqd (Chlorhexidine Gluconate) .... Wash Twice Daily For 7 Days, Dispense One Bottle 10)  Bactroban Nasal 2 % Oint (Mupirocin Calcium) .... Apply To Inside of Nares Twice Daily For  7 Days 11)  Ms Contin 15 Mg Xr12h-Tab (Morphine Sulfate) .... Take 1 Tablet By Mouth Two Times A Day  Current Medications (verified): 1)  Prilosec Otc 20 Mg  Tbec (Omeprazole Magnesium) .... Take Two Tablets Once Daily 2)  Intelence 100 Mg  Tabs (Etravirine) .... Take Two Tablets Twice Daily By Mouth 3)  Truvada 200-300 Mg  Tabs (Emtricitabine-Tenofovir) .... Take 1 Tablet By Mouth Once A Day 4)  Isentress 400 Mg  Tabs (Raltegravir Potassium) .... Take 1 Tablet By Mouth Two Times A  Day 5)  Gabapentin 300 Mg Caps (Gabapentin) .... Take One Tablet Three Times A Day 6)  Oxycodone Hcl 5 Mg Tabs (Oxycodone Hcl) .... Take One Tablet As Needed Op To Four Times Daily For Breakthrough Pain, Fill On March The 2nd, 2011 7)  Bactroban Nasal 2 % Oint (Mupirocin Calcium) .... Apply To Inside of Nares Twice Daily For  7 Days 8)  Ms Contin 15 Mg Xr12h-Tab (Morphine Sulfate) .... Take 1 Tablet By Mouth Two Times A Day Fill On March The 2nd, 2011  Allergies (verified): 1)  ! Aleve 2)  ! Nsaids   Review of Systems  The patient denies anorexia, fever, weight loss, weight gain, vision loss, decreased hearing, hoarseness, chest pain, syncope, dyspnea on exertion, peripheral edema, prolonged cough, headaches, hemoptysis, abdominal pain, melena, hematochezia, severe indigestion/heartburn, hematuria, incontinence, genital sores, muscle weakness, suspicious skin lesions,  transient blindness, difficulty walking, depression, unusual weight change, abnormal bleeding, and enlarged lymph nodes.    Vital Signs:  Patient profile:   55 year old male Height:      70 inches (177.80 cm) Weight:      215.7 pounds BMI:     31.06 Temp:     98.7 degrees F oral Pulse rate:   97 / minute BP sitting:   139 / 78  (left arm)  Vitals Entered By: Wendall Mola CMA Duncan Dull) (January 21, 2010 2:57 PM) CC: f/u, Depression Is Patient Diabetic? No Pain Assessment Patient in pain? no      Nutritional Status BMI of > 30 = obese Nutritional Status Detail nl  Does patient need assistance? Functional Status Self care Ambulation Normal Comments pt states he has weened himself off of his welbutrin   Physical Exam  General:  alert and overweight-appearing. well-nourished and well-hydrated.   Head:  normocephalic and atraumatic.   Eyes:  vision grossly intact, pupils equal, pupils round, and pupils reactive to light.   Ears:  no external deformities.  he is wearing bilateral hearing aids Nose:  no  external deformity, no external erythema, and no nasal discharge.   Mouth:  pharynx pink and moist, no erythema, and no exudates.  Neck:  supple and full ROM.   Lungs:  normal respiratory effort, no crackles, and no wheezes.   Heart:  normal rate, regular rhythm, no murmur, no gallop, and no rub.   Abdomen:  no distention.  soft, non-tender, no masses, and no guarding.   Msk:  he is able to rotate shoulder upwards without difficulty and in fact with relief of pain Neurologic:  alert & oriented X3.  strength intact though he is slightly limited in movement in his shoulder Skin:  no rashes.  he has numbness in ulnar nerve distrubition but sensation is intact on exam Psych:  Oriented X3.  memory intact for recent and remote and normally interactive.          Medication Adherence: 01/21/2010   Adherence to medications reviewed with patient. Counseling to provide adequate adherence provided   Prevention For Positives: 01/21/2010   Safe sex practices discussed with patient. Condoms offered.   Education Materials Provided: 01/21/2010 Safe sex practices discussed with patient. Condoms offered.                          Impression & Recommendations:  Problem # 1:  HIV DISEASE (ICD-042)  Reasonable control, continue salvage regimen. Diagnostics Reviewed:  HIV: CDC-defined AIDS (01/19/2010)   CD4: 640 (01/03/2010)   WBC: 11.4 (01/02/2010)   Hgb: 16.1 (01/02/2010)   HCT: 48.0 (01/02/2010)   Platelets: 342 (01/02/2010) HIV-1 RNA: 148 (01/02/2010)   HBSAg: No (01/25/2007)  Orders: Est. Patient Level IV ([address])Future Orders: T-CD4SP ([facility]) (CD4SP) ... 04/29/2010  Problem # 2:  FOOT PAIN, BILATERAL (ICD-729.5)  filled gabpaentyin and rx narcotics  Orders: Est. Patient Level IV ([address])  Problem # 3:  HYPERLIPIDEMIA (ICD-272.4) He went off lipid lower drugs.rehcek next visti Orders: Est. Patient Level IV ([address])Future Orders: T-Lipid Profile ([id number]) ... 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  Problem # 5:  DISC DISEASE, CERVICAL (ICD-722.4)  seeing Dr. Dutch Quint, seee above, Narcotics given for March the 2nd date.  Orders: Est. Patient Level IV ([address])  Medications Added to Medication List This Visit: 1)  Oxycodone Hcl 5 Mg Tabs (Oxycodone hcl) .... Take one tablet as needed op to four times daily for breakthrough pain, fill on march the 2nd, 2011 2)  Ms Contin 15 Mg Xr12h-tab (Morphine sulfate) .... Take 1 tablet by mouth two times a day fill on march the 2nd, 2011  Other Orders: Future Orders: T-HIV Viral Load [phone]) ... 04/29/2010 T-CBC w/Diff ([id number]) ... 04/29/2010 T-Comprehensive Metabolic Panel [id number]) ... 04/29/2010 T-RPR (Syphilis) [id number]) ... 04/29/2010   Prescriptions: GABAPENTIN 300 MG CAPS (GABAPENTIN) take one tablet three times a day  #270 x 0   Entered and Authorized by:   Acey Lav MD   Signed by:   Paulette Blanch Dam MD on 01/21/2010   Method used:   Print then Give to Patient   RxID:   [id number] OXYCODONE HCL 5 MG TABS (OXYCODONE HCL) take one tablet as needed op to four times daily for breakthrough pain, fill on March the 2nd, 2011  #60 x 0   Entered and Authorized by:   Acey Lav MD   Signed by:   Paulette Blanch Dam MD on 01/21/2010   Method used:   Print then Give to Patient   RxID:   [id number] MS CONTIN 15 MG XR12H-TAB (MORPHINE SULFATE) Take 1 tablet by mouth two times a day fill on March  the 2nd, 2011  #62 x 0   Entered and Authorized by:   Acey Lav MD   Signed by:   Paulette Blanch Dam MD on 01/21/2010   Method used:   Print then Give to Patient   RxID:   [id number]  Process Orders Check Orders Results:     [facility]: Check successful Tests Sent for requisitioning (January 22, 2010 12:51 AM):     04/29/2010: [facility] -- T-HIV Viral Load [phone] [id number] (signed)     04/29/2010: [facility] -- T-CBC w/Diff [[id number]] (signed)     04/29/2010: [facility] -- T-Comprehensive Metabolic Panel [[id number]] (signed)     04/29/2010: [facility] -- T-Lipid Profile [phone] (signed)     04/29/2010: [facility] -- T-RPR (Syphilis) [phone] (signed)

## 2010-12-31 NOTE — Miscellaneous (Signed)
Summary: gabapentin - covered by Medicare now  Clinical Lists Changes he was getting this thru [employer] to Care. last acceptance letter dated 02/22/09. this is good for 1 yr. has new form been done to continue getting the drugs?Marland KitchenGolden Circle RN  September 13, 2010 8:33 AM  Pt. now has Medicare.  This covers the gabapentin rx.  Jennet Maduro RN  September 23, 2010 4:18 PM

## 2010-12-31 NOTE — Miscellaneous (Signed)
Summary: RW Update  Clinical Lists Changes  Observations: Added new observation of YEARAIDSPOS: 2010  (01/19/2010 9:29) Added new observation of HIV STATUS: CDC-defined AIDS  (01/19/2010 9:29)

## 2010-12-31 NOTE — Progress Notes (Signed)
Summary: Prior authorization for Oxycodone--pending  ---- Converted from flag ---- ---- 09/03/2009 2:31 PM, Starleen Arms CMA wrote: Patient needs prior auth. on Oxycodone [phone]  Thanks ------------------------------       Additional Follow-up for Phone Call Additional follow up Details #2::    Patient never called back with information to take care of his prior authorization for pain medication Follow-up by: Paulo Fruit  BS,CPht II,MPH,  September 25, 2009 12:08 PM  Called phone number provided for prior authorization.  No fax was sent from pharmacy. I called the phone number given in which is to [facility] plan.  I need patient's ID number to start claim.

## 2010-12-31 NOTE — Miscellaneous (Signed)
Summary: clinical update/ryan white  Clinical Lists Changes  Observations: Added new observation of AIDSDAP: Yes 2009 (01/17/2008 10:32)

## 2010-12-31 NOTE — Letter (Signed)
Summary: Ryan White-11/16/2006  Ryan White-11/16/2006   Imported By: Dorice Lamas 01/11/2007 14:03:22  _____________________________________________________________________  External Attachment:    Type:   Image     Comment:   External Document

## 2010-12-31 NOTE — Medication Information (Signed)
Summary: Tax adviser   Imported By: Florinda Marker 10/01/2009 14:44:52  _____________________________________________________________________  External Attachment:    Type:   Image     Comment:   External Document

## 2010-12-31 NOTE — Progress Notes (Signed)
Summary: Pt assist meds arrived 3 month supply  Phone Note Refill Request      Prescriptions: GABAPENTIN 300 MG CAPS (GABAPENTIN) take one tablet three times a day  #300 x 0   Entered by:   Paulo Fruit  BS,CPht II,MPH   Authorized by:   Acey Lav MD   Signed by:   Paulo Fruit  BS,CPht II,MPH on 12/28/2009   Method used:   Samples Given   RxID:   [id number]   Patient Assist Medication Verification: Medication: Neurontin 300mg  Lot#[id number] Exp Date:Oct 13 Tech approval:MLD Call placed to patient with message that assistance medications are ready for pick-up. Patient said he will pick up today when he comes to pick up a prescription his doctor wrote. Paulo Fruit  BS,CPht II,MPH  December 28, 2009 12:13 PM

## 2010-12-31 NOTE — Progress Notes (Signed)
Summary: vicodin rf/ty per Daiva Eves  Phone Note Refill Request   Reason for Call: Insurance Question Summary of Call: Patient wants to know if he can have a refill on his vicodin. Ok per Dr.Van Dam  Initial call taken by: Starleen Arms CMA,  Apr 20, 2009 3:20 PM      Prescriptions: VICODIN 5-500 MG TABS (HYDROCODONE-ACETAMINOPHEN) take one to two tablets twice daily for pain  #60 x 0   Entered by:   Starleen Arms CMA   Authorized by:   Acey Lav MD   Signed by:   Starleen Arms CMA on 04/20/2009   Method used:   Telephoned to ...       [facility]  [address] [facility] ([facility]ail)       [address]       [place], [place]  [address]       Ph: [phone]       Fax: [phone]   RxID:   [id number]

## 2010-12-31 NOTE — Letter (Signed)
Summary: [facility]   Imported By: Randon Goldsmith 01/04/2008 11:37:40  _____________________________________________________________________  External Attachment:    Type:   Image     Comment:   External Document

## 2010-12-31 NOTE — Letter (Signed)
Summary: Results Letter  [facility]  [address] [place], [place] [address]   Phone: [phone]  Fax: [phone]        May 11, 2008 MRN: [id number]    Hunter Pratt [address] [place], [place]  [address]    Dear Mr. Ballman,  The polyp(s) that were removed during your recent procedure were proven to be adenomas.  These are pre-cancerous polyps that may have grown into cancers if they had not been removed.  Current colon polyp surveillance guidelines recommend that you have a repeat colonoscopy in 5 years.  We will therefore put your information in our reminder system and will contact you in 5 years to schedule a repeat procedure.  Please call if you have any questions or concerns.        Sincerely,  Rachael Fee MD  This letter has been electronically signed by your physician.

## 2010-12-31 NOTE — Procedures (Signed)
Summary: EGD   EGD  Procedure date:  05/10/2008  Findings:      Location: [facility]    Patient Name: Hunter Pratt, Hunter Pratt MRN:  Procedure Procedures: Panendoscopy (EGD) CPT: 43235.    with biopsy(s)/brushing(s). CPT: D1846139.  Personnel: Endoscopist: Rachael Fee, MD.  Exam Location: Exam performed in Endoscopy Suite. Outpatient  Patient Consent: Procedure, Alternatives, Risks and Benefits discussed, consent obtained, from patient. Consent was obtained by the RN.  Indications Symptoms: Reflux symptoms  History  Current Medications: Patient is not currently taking Coumadin.  Comments: Patient history reviewed and updated, pre-procedure physical performed prior to initiation of sedation? YES Pre-Exam Physical: Performed May 10, 2008  Cardio-pulmonary exam, Abdominal exam, Mental status exam WNL.  Exam Exam Info: Maximum depth of insertion Duodenum, intended Duodenum. Patient position: on left side. Gastric retroflexion performed. Images taken. ASA Classification: II. Tolerance: good.  Sedation Meds: Patient assessed and found to be appropriate for moderate (conscious) sedation. Residual sedation present from prior procedure today. Fentanyl 50 mcg. given IV. Versed 5 mg. given IV. Cetacaine Spray 2 sprays given aerosolized.  Monitoring: BP and pulse monitoring done. Oximetry used. Supplemental O2 given  Findings - Normal: Proximal Esophagus to Duodenal 2nd Portion. Comments: OTHERWISE NORMAL EXAMINATION.  - MUCOSAL ABNORMALITY: Body to Duodenal Apex. RUT done, results pending. Comment: MILD, NON-SPECIFIC GASTRITIS AND DUODENITIS.   Assessment Abnormal examination, see findings above.  Comments: MILD, NON-SPECIFIC GASTRITIS AND DUODENITIS.  CLO BIOPSIES TAKEN AND IF POSITIVE, WILL ERADICATE THE H. PYLORI.  NO GERD SEQUELAE (PEPTIC STRICTURE, ESOPHAGITIS, BARRETT'S).  HIS SYMPTOMS ARE WELL CONTROLLED ON ONCE TO TWICE DAILY PPI AND HE SHOULD CONTINUE  THAT (OTC PRILOSEC) FOR NOW. Events  Unplanned Intervention: No unplanned interventions were required.  Unplanned Events: There were no complications.  cc:  Paulette Blanch Dam MD  clo testing was negative for h. pylori

## 2010-12-31 NOTE — Progress Notes (Signed)
Summary: pain med refills  Phone Note Call from Patient Call back at Home Phone [phone]   Caller: Patient Call For: Paulette Blanch Dam MD Reason for Call: Refill Medication Summary of Call: Pain med refill request.  will print and place for signature.  Jennet Maduro RN  July 24, 2010 12:31 PM     New/Updated Medications: MS CONTIN 15 MG XR12H-TAB (MORPHINE SULFATE) Take 1 tablet by mouth two times a day may be filled on Aug 27th Prescriptions: MS CONTIN 15 MG XR12H-TAB (MORPHINE SULFATE) Take 1 tablet by mouth two times a day may be filled on Aug 27th  #62 x 0   Entered by:   Jennet Maduro RN   Authorized by:   Paulette Blanch Dam MD   Signed by:   Jennet Maduro RN on 07/24/2010   Method used:   Print then Give to Patient   RxID:   [id number] OXYCODONE HCL 5 MG TABS (OXYCODONE HCL) take one tablet as needed up to four times daily for breakthrough pain fill on July 27th  #60 x 0   Entered by:   Jennet Maduro RN   Authorized by:   Acey Lav MD   Signed by:   Jennet Maduro RN on 07/24/2010   Method used:   Print then Give to Patient   RxID:   [id number]

## 2010-12-31 NOTE — Progress Notes (Signed)
Summary: Calls to Dr. Haroldine Pratt [facility]) and Dr. Marlyne Pratt [facility])  Phone Note Outgoing Call   Call placed by: Hunter Lav MD,  December 23, 2007 4:35 PM Details for Reason: discussions with ENT at [facility] and [facility] Summary of Call: Spoke with Dr. Haroldine Pratt form ENT at [facility] as well as Dr. Marlyne Pratt at [facility]. Dr. Marlyne Pratt can see the patient next week. I have called Hunter Pratt his Diplomatic Services operational officer at [phone] to arrange an appt for Hunter Pratt as none has yet been made (Initial tentative appt was for the 28th at 9). Further numbers at [facility], the appt desk [phone]. Both Dr. Haroldine Pratt and Dr. Marlyne Pratt assured me there was no need for urgent surgical intervention. Dr. Marlyne Pratt recommended starting steroids which I am prescribing presently. Hunter Pratt at [facility] has my cell, pager and the ID clinic number. Initial call taken by: Hunter Lav MD,  December 23, 2007 4:38 PM  Follow-up for Phone Call        I [facility] at [phone]. I am also having him hold his Hunter Pratt (which can cause tinnitus, which he is also experiencing at present and has in the past with this medicine) and his raltegravir. I do not belive either medicine has ever been shown to cause hearing loss (there had been reports of this happenign with Hunter Pratt. However, given everything that is going on, I would rather not add to his discomfort if the Hunter Pratt is causing his symptoms. I willl likely switch him to a different regimen, perhaps, raltegravir, truvada and etravirine. Follow-up by: Hunter Lav MD,  December 23, 2007 5:00 PM  Additional Follow-up for Phone Call Additional follow up Details #1::        I spoke at length with Hunter Pratt. I am changing him to raltegravir, etravirine and truvada regimen. I have reviewed the dosing and the side effects. I  am also going to have him come in on Monday to discuss these medication and check his labs as he had a flare of his PUD and some small amt of blood in stools and will need check up in  clinic. I gave him warning signs to look for re further bleeding from GI tract hemetemesis, light-headedness and to go to ED if these occur. Hunter Pratt can we work on gettin these  meds to him along with the prilosec via [facility] and Hunter Pratt, [place] I want him to come in on Monday at 10 as overbook to check his cbc and talk with him briefly. Thanks, Hunter Pratt Additional Follow-up by: Hunter Lav MD,  December 30, 2007 10:43 AM    Additional Follow-up for Phone Call Additional follow up Details #2::    Scheduled pt. @ 1000, Monday, 01/03/08 2/ Dr. Daiva Pratt. ..................................................................Marland KitchenJennet Maduro RN  December 31, 2007 10:33 AM   Additional Follow-up for Phone Call Additional follow up Details #3:: Details for Additional Follow-up Action Taken: We should also get records from [facility] for his appt on Monday. Additional Follow-up by: Hunter Lav MD,  December 31, 2007 10:42 AM  New/Updated Medications: PREDNISONE 20 MG  TABS (PREDNISONE) take four tablets a day for 10 days INTELENCE 100 MG  TABS (ETRAVIRINE) take two tablets twice daily by mouth TRUVADA 200-300 MG  TABS (EMTRICITABINE-TENOFOVIR) Take 1 tablet by mouth once a day ISENTRESS 400 MG  TABS (RALTEGRAVIR POTASSIUM) Take 1 tablet by mouth two times a day   Prescriptions: ISENTRESS 400 MG  TABS (RALTEGRAVIR POTASSIUM) Take 1  tablet by mouth two times a day  #62 x 12   Entered and Authorized by:   Hunter Lav MD   Signed by:   Hunter Blanch Dam MD on 12/30/2007   Method used:   Print then Give to Patient   RxID:   [id number] TRUVADA 200-300 MG  TABS (EMTRICITABINE-TENOFOVIR) Take 1 tablet by mouth once a day  #31 x 12   Entered and Authorized by:   Hunter Lav MD   Signed by:   Hunter Blanch Dam MD on 12/30/2007   Method used:   Print then Give to Patient   RxID:   [id number] INTELENCE 100 MG  TABS (ETRAVIRINE) take two tablets twice daily by mouth  #124 x 12    Entered and Authorized by:   Hunter Lav MD   Signed by:   Hunter Blanch Dam MD on 12/30/2007   Method used:   Print then Give to Patient   RxID:   [id number] PREDNISONE 20 MG  TABS (PREDNISONE) take four tablets a day for 10 days  #40 x 0   Entered and Authorized by:   Hunter Lav MD   Signed by:   Hunter Blanch Dam MD on 12/23/2007   Method used:   Print then Give to Patient   RxID:   [id number]

## 2010-12-31 NOTE — Assessment & Plan Note (Signed)
History of Present Illness Visit Type: new patient Primary GI MD: Rob Bunting MD Primary Provider: Acey Lav, M.D. Requesting Provider: Acey Lav, M.D. Chief Complaint: blood in stools and acid reflux/dyspepsia History of Present Illness:   very pleasant 55 year old man, HIV positive, who has had GERD symptoms for many years. He has been evaluated by an ear nose and throat physician who says he has acid damage. Originally he was taking Prilosec 40 mg once a day 20-30 minutes prior to his breakfast meal, after evaluation by the ear nose and throat Dr. he was told to double his medicine. He is been on Prilosec for a total of 2 months. When he takes the medicine once a day his pyrosis symptoms were completely resolved. He has no dysphasia. He has been gaining weight.  He also has minor intermittent rectal bleeding. The last diagnosis was several months ago. He had FOBT testing done recently and was told he had microscopic blood in the stool.  he has been taking quite a lot of NSAIDs recently due to dental pains.   GI Review of Systems    Reports acid reflux, bloating, heartburn, nausea, and  weight gain.      Denies abdominal pain.      Reports heme positive stool and  rectal bleeding.        Updated Prior Medication List: PRILOSEC OTC 20 MG  TBEC (OMEPRAZOLE MAGNESIUM) two tablets a day by mouth twice daily for a month, then two tablets once daily INTELENCE 100 MG  TABS (ETRAVIRINE) take two tablets twice daily by mouth TRUVADA 200-300 MG  TABS (EMTRICITABINE-TENOFOVIR) Take 1 tablet by mouth once a day ISENTRESS 400 MG  TABS (RALTEGRAVIR POTASSIUM) Take 1 tablet by mouth two times a day NEURONTIN 300 MG  CAPS (GABAPENTIN) Take 1 capsule by mouth at bedtime WELLBUTRIN SR 150 MG  TB12 (BUPROPION HCL) Take 1 tablet by mouth once a day for 3 days then Take 1 tablet by mouth two times a day ISENTRESS 400 MG  TABS (RALTEGRAVIR POTASSIUM)   Current Allergies (reviewed  today): ! ALEVE ! 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    Family History of Colon Polyps:  Social History:    Single    Current Smoker    Alcohol use-no    Drug use-no    former alcohol and drug abuser quit 1998    drink at least 3 large coffees a day sometimes more     Vital Signs:  Patient Profile:   55 Years Old Male Height:     70 inches (177.80 cm) Weight:      245.25 pounds BMI:     35.32 BSA:     2.28 Pulse rate:   80 / minute Pulse rhythm:   regular BP sitting:   128 / 84  (left arm)  Vitals Entered By: Milford Cage CMA (Apr 21, 2008 9:07 AM)  Physical Exam  Constitutional: generally well appearing Psychiatric: alert and oriented times 3 Eyes: extraocular movements intact Mouth: oropharynx moist, no lesions Neck: supple, no lymphadenopathy Cardiovascular: heart regular rate and rythm Lungs: CTA bilaterally Abdomen: soft, non-tender, non-distended, no obvious ascites, no peritoneal signs, normal bowel sounds Extremities: no lower extremity edema bilaterally Skin: no lesions on visible extremities poor dentition    Impression & Recommendations:  Problem # 1:  GERD (ICD-530.81) his classic GERD symptoms of pyrosis are very well controlled on even once daily over-the-counter Prilosec. He was told by ear nose and throat physician recently to double that because he was seen to have some type of irritation in his throat. I recommended that he change to prescription Nexium and that he should take that pill once daily 20-30 minutes prior to his breakfast meal. Also given a  GERD handout and I specifically recommended that he cut back on his caffeine intake as this can definitely increase GERD. I will arrange for him to have an EGD performed at his soonest convenience.  Problem # 2:  SCREENING COLORECTAL-CANCER (ICD-V76.51) he is microscopic blood in the stool and he is actually seen some blood as well on a mild intermittent basis. For this will arrange for him to have a colonoscopy at his soonest convenience at the same time as his EGD.   Patient Instructions: 1)  A copy of this information will be sent to Dr. Paulette Blanch Dam 2)  Take samples of nexium, one pill once a day, shortly before breakfast meal. 3)  Stop the OTC omeprazole. 4)  GERD handout given. 5)  Cut back on coffee. 6)  You will be scheduled to have a colonoscopy. 7)  You will be scheduled to have an uppper endoscopy.    ]  Appended Document: Orders Update movi prep    Clinical Lists Changes  Medications: Added new medication of MOVIPREP 100 GM  SOLR (PEG-KCL-NACL-NASULF-NA ASC-C) As per prep instructions. - Signed Rx of MOVIPREP 100 GM  SOLR (PEG-KCL-NACL-NASULF-NA ASC-C) As per prep instructions.;  #1 x 0;  Signed;  Entered by: Chales Abrahams CMA;  Authorized by: Rachael Fee MD;  Method used: Electronic Orders: Added new Test order of Colon/Endo (Colon/Endo) - Signed    Prescriptions: MOVIPREP 100 GM  SOLR (PEG-KCL-NACL-NASULF-NA ASC-C) As per prep instructions.  #1 x 0   Entered by:   Chales Abrahams CMA   Authorized by:   Rachael Fee MD   Signed by:   Chales Abrahams CMA on 04/21/2008   Method used:   Electronically sent to ...       [facility]  [address]       [address]       [place], [place]  [address]       Ph: [phone]       Fax: [phone]   RxID:   [id number]

## 2010-12-31 NOTE — Miscellaneous (Signed)
Summary: Update immunizations  Clinical Lists Changes  Observations: Added new observation of PNEUMOVAX: Historical (11/16/2006 10:39) Added new observation of FLU VAX: Historical (11/16/2006 10:39)       Influenza Immunization History:    Influenza # 1:  Historical (11/16/2006)  Pneumovax Immunization History:    Pneumovax # 1:  Historical (11/16/2006)

## 2010-12-31 NOTE — Progress Notes (Signed)
Summary: Request for Medication 12-29-07/mld  Phone Note Call from Patient Call back at Home Phone [phone]   Caller: Patient Details for Reason: Cost of medication Summary of Call: Patient called to let me know that he was at the ENT at [facility] this morning and was given a prescription for Nexium in which he is unable to afford.  Patient is currently on [facility] until 02/28/2009.  The medications Prilosec and Prevacid are on this formulary.  Patient states that he has taken Prilosec in the past and it worked just fine.  Patient was also taken off of Prednison because of flare-ups with ulcers.  Would like for Byrd Hesselbach if approved by Daiva Eves to call in a prescription for one of these to [facility].  He has an appt with Byrd Hesselbach to renew for [facility] for 2009. Initial call taken by: Paulo Fruit,  December 29, 2007 3:43 PM  Follow-up for Phone Call        It is fine for him to have prilosec.  I would also like to talk to him about  putting him on a new regimen vs putting him back on the atripla and raltegravir (I think the former would be better) Follow-up by: Acey Lav MD,  December 30, 2007 9:30 AM  Additional Follow-up for Phone Call Additional follow up Details #1::        [facility] mail order pharmacy ([facility]) notified about the Prilosec.  Patient will be receiving on Monday, Feb. 2, 2009 [phone] Additional Follow-up by: Paulo Fruit,  December 31, 2007 9:43 AM    New/Updated Medications: NEXIUM 40 MG  PACK (ESOMEPRAZOLE MAGNESIUM) Take 1 tablet by mouth once a day PRILOSEC OTC 20 MG  TBEC (OMEPRAZOLE MAGNESIUM) two tablets a day by mouth   Prescriptions: PRILOSEC OTC 20 MG  TBEC (OMEPRAZOLE MAGNESIUM) two tablets a day by mouth  #62 x 5   Entered and Authorized by:   Acey Lav MD   Signed by:   Paulette Blanch Dam MD on 12/30/2007   Method used:   Print then Give to Patient   RxID:   [id number] NEXIUM 40 MG  PACK (ESOMEPRAZOLE MAGNESIUM) Take 1 tablet by  mouth once a day  #31 x 0   Entered and Authorized by:   Acey Lav MD   Signed by:   Paulette Blanch Dam MD on 12/30/2007   Method used:   Print then Give to Patient   RxID:   [id number]

## 2010-12-31 NOTE — Miscellaneous (Signed)
Summary: Yearly Financial Assessment Check  Clinical Lists Changes  Observations: Added new observation of PCTFPL: 45.52  (11/30/2007 14:58)

## 2010-12-31 NOTE — Miscellaneous (Signed)
Summary: RW Update  Clinical Lists Changes  Observations: Added new observation of YEARAIDSPOS: 1998  (01/19/2010 10:22)

## 2010-12-31 NOTE — Miscellaneous (Signed)
Summary: clinical update/ryan white  Clinical Lists Changes  Observations: Added new observation of FINASSESSDT: 05/07/2009 (05/07/2009 11:06)

## 2010-12-31 NOTE — Miscellaneous (Signed)
Summary: clincial update/ryan white  Clinical Lists Changes  Observations: Added new observation of PCTFPL: 34.62  (06/18/2007 14:51) Added new observation of AIDSDAP: Yes 2008  (06/18/2007 14:51)

## 2010-12-31 NOTE — Miscellaneous (Signed)
Summary: HIPPA  HIPPA   Imported By: Gentry Fitz 01/18/2009 16:40:29  _____________________________________________________________________  External Attachment:    Type:   Image     Comment:   External Document

## 2010-12-31 NOTE — Progress Notes (Signed)
Summary: phone note-shoulder pain  Phone Note Call from Patient   Caller: Patient Call For: Paulette Blanch Dam MD Reason for Call: Acute Illness Summary of Call: Patient stated that he fell and hurt his shoulder, he went to the ED and gave him hydrocodone. 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 Initial call taken by: Starleen Arms CMA,  August 16, 2009 12:14 PM  Follow-up for Phone Call        That is fine we can give him percocet Follow-up by: Acey Lav MD,  August 17, 2009 10:25 AM    New/Updated Medications: OXYCODONE-ACETAMINOPHEN 5-500 MG CAPS (OXYCODONE-ACETAMINOPHEN) one to two tablets every six hours as needed for severe shoulder pain Prescriptions: OXYCODONE-ACETAMINOPHEN 5-500 MG CAPS (OXYCODONE-ACETAMINOPHEN) one to two tablets every six hours as needed for severe shoulder pain  #60 x 0   Entered and Authorized by:   Acey Lav MD   Signed by:   Paulette Blanch Dam MD on 08/17/2009   Method used:   Print then Give to Patient   RxID:   [id number]

## 2010-12-31 NOTE — Progress Notes (Signed)
Summary: Pain med refill request   Phone Note Call from Patient   Summary of Call: Pt caling for medication refill today. Left message with his Mother Dr Algis Liming is not in today. Script should be available on Monday, Dec 31, 2009. Tomasita Morrow RN  December 28, 2009 2:06 PM   Follow-up for Phone Call        Script printed and placed in Dr Lattie Haw box for signature. Tomasita Morrow RN  December 28, 2009 2:08 PM  signed scripts in my "outbox."  Follow-up by: Acey Lav MD,  December 31, 2009 8:44 AM    Prescriptions: OXYCODONE HCL 5 MG TABS (OXYCODONE HCL) take one tablet as needed op to four times daily for breakthrough pain  #60 x 0   Entered by:   Tomasita Morrow RN   Authorized by:   Acey Lav MD   Signed by:   Tomasita Morrow RN on 12/28/2009   Method used:   Print then Give to Patient   RxID:   [id number] MS CONTIN 15 MG XR12H-TAB (MORPHINE SULFATE) Take 1 tablet by mouth two times a day  #62 x 0   Entered by:   Tomasita Morrow RN   Authorized by:   Acey Lav MD   Signed by:   Tomasita Morrow RN on 12/28/2009   Method used:   Print then Give to Patient   RxID:   [id number]

## 2010-12-31 NOTE — Miscellaneous (Signed)
Summary: Soc. Work   Social Work Evaluation Date  05/09/2009 Patient name Hunter Pratt  Primary MD   : Acey Lav, M.D. Social Worker's name : Dorothe Pea MSW- LCSW  Home [phone]    Cell phone: .  Marland Kitchen     Alternate phone: . Marland Kitchen        Primary Reason for Referral:  Coordinate Mental Health Services-therapy and/or Psychiatry    Assist with Disability Process or Referral to Voc. Rehab Comments multiple health issues, HIV.  Disability application pending since 2008.   Needs disability and Medicaid/Medicare. Declining mental health. Lives with mother.   Patient requests counseling and needs someone to talk to right now.  Action taken by Social Work: Contacted patient and found out that he has a hearing with his attorney and SSD on August 18th.  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 He just reapplied for Medicaid and was denied.  Apparently due to his productive work hx, SSD thinks he can work.   More recent medical issues include hearing loss and arthritis according to the patient.   I have given the patient phone numbers for [facility] and [facility] out of [facility] for counseling.  He takes Welbutrin for depression. He used to be connected with [facility] when he was in [place].   SW follow-up to ensure access to services.  Discuss extent of disability with Dr. Daiva Eves and possibly meet with patient in order to write letter of support.     Appended Document: Soc. Work [name] called and said that [facility] did a long phone assessment and they should be able to schedule him for [facility] services within the next two weeks.

## 2010-12-31 NOTE — Progress Notes (Signed)
Summary:  ED visit for kidney stone  Phone Note Call from Patient Call back at Home Phone [phone]   Caller: Patient Reason for Call: Acute Illness Action Taken: Provider Notified Summary of Call: Went to ED @ [facility] last night 12/30/07.  Diagnosed with Kidney stone.  Given percocet rx, which he is unable to pay for, too expensive.  Also given rx for reglan for nausea.  RN advised pt. to drink copious amounts of water and cranberry juice in order to pass the stone.  Pt. desires new rx for less expensive pain medication.   Initial call taken by: Jennet Maduro RN,  December 31, 2007 10:25 AM  Follow-up for Phone Call        Obtained order for vicodin 5/500 take 1-2 tablets by  mouth every 6 hours as needed pain from Dr. Daiva Eves Follow-up by: Jennet Maduro RN,  December 31, 2007 10:27 AM  Additional Follow-up for Phone Call Additional follow up Details #1::        Thanks [facility] Additional Follow-up by: Acey Lav MD,  December 31, 2007 10:34 AM

## 2010-12-31 NOTE — Progress Notes (Signed)
Summary: Pain Meds  Phone Note Call from Patient   Caller: Patient Summary of Call: Pt. called for pain med refills.  Wants to pick up tomorrow.  Will ask Dr. Philipp Deputy to sign Initial call taken by: Starleen Arms CMA,  August 26, 2010 11:59 AM

## 2010-12-31 NOTE — Letter (Signed)
Summary: Disability Determination Services  Disability Determination Services   Imported By: America Brown 03/03/2008 17:13:16  _____________________________________________________________________  External Attachment:    Type:   Image     Comment:   External Document

## 2010-12-31 NOTE — Progress Notes (Signed)
Summary: [facility] ENT Referral   Phone Note Outgoing Call   Call placed by: Tomasita Morrow RN,  December 24, 2007 10:45 AM Summary of Call: We will need to call and register pt prior to calling appt desk.  [phone] registration line then call [phone] for appt desk to sched appt  per  [facility] @ [phone]  Medical Record number given [id number]  Appt Jan 28 @ 10:30 am. [facility]  ground floor. Pt informed/ Forms faxed to [phone]   Initial call taken by: Tomasita Morrow RN,  December 24, 2007 10:49 AM         Appended Document: [facility] ENT Referral     Phone Note Outgoing Call   Call placed by: Tomasita Morrow RN,  December 27, 2007 3:31 PM Summary of Call: Spoke with [facility] pt says a call was left on his machine that he needed to bring his insurance card or money for his office visit with the ENT. I spoke with the finacnial office who said pt can come for OV without money and see financial officer while there for visit. I also spoke with someone who qualifies patients for charity care.   Initial call taken by: Tomasita Morrow RN,  December 27, 2007 4:42 PM

## 2010-12-31 NOTE — Progress Notes (Signed)
Summary: Poss allergic reaction  Phone Note Call from Patient   Caller: Patient Summary of Call: Rash started Friday night on face and arms. He took two benadryl Woke up with rash on both arms. Sat. morn took two benadry with relief.  This am took Prilosec and Intelence and Isentress. Rash started on bilat arms and back.  Pt wonders if he should continue meds. He thinks its the Intelence because this is the only new drug. He is willing to tolerate rash if you tell him to continue. Please advise. Initial call taken by: Tomasita Morrow RN,  January 12, 2008 3:19 PM  Follow-up for Phone Call        Per Dr Daiva Eves  continue the Intelence through the week. If rash worsens he can discontiue and call the office. Call  us back on Monday for status of rash and poss. sooner office visit.  Verbal order/Read back. Dr Daiva Eves Follow-up by: Tomasita Morrow RN,  January 12, 2008 3:27 PM  Additional Follow-up for Phone Call Additional follow up Details #1::        Thanks Lakeisha Waldrop, per the literature rash typically will begin within 2 wks of starting therapy and resolve within the first month. Lets see how he does, if it does not worsen maybe we can sweat this out. Obviously any worsening, extension and he has to stop this. Then we can put him on a different regimen such as boosted tipranavir, truvada and raltegravir forx ex. Additional Follow-up by: Acey Lav MD,  January 12, 2008 4:25 PM

## 2010-12-31 NOTE — Assessment & Plan Note (Signed)
Summary: F/U/VS   Primary Provider:  Acey Lav, M.D.  CC:  f/u labs and Lipid Management.  History of Present Illness: 55 year old with HIV  on  raltegravir, etravirine, truvada with most recent viral load of 161 and healthy cd4 count.  He denies missing any doses of his ARVS recently . He has not been sexually active receently. His depression is better controlled though it has been difficult at times in particular with difficulty obtaining pain control. He had developed  severe pain in  back of arm since August without numbness, in elbow in shoulder blade. Pain has been  8/10 in severity. Pain has been constant dull throbbing.Marland Kitchen He also now has  numbness in his fingers in his right arm for 2 days. (ulnar nerve distribution). I did mRI of the shoulder and repeated MRI of the C spine (where he has had 2 C spine surgeries) . I referred him to Dorthula Nettles who reviewed the patients case with me last week. Per Dr. Dion Saucier exam and films are NOT consistent with shoulder pathology but are quite typical for cervical spine disease. The patient has not yet seen Dr. Jeral Fruit. The patient had run out of pain meds previously and then had difficulty filling new script that I had written for him due to the pharmacy suspecting him of substance abuse. I reviewed his rxs with his pharmacy and I DO NOT suspect abuse at this point in time. The patietn further is NOT someone who can manage pain with aspirin or NSAIds having had an UGIB in the past when self treating with "goody's" On the oxcodone that his phrarmacy finally let him have his pain is down to t5-6/10 after two oxycodones. He uses up to 3-4 oxycodones daily at most 6--though rarely We  spent 15 minutes of face to face time discussing his care. We decided on long acting oxycontin twice daily along with oxycodone for breakthrough pain. He signed a pain contract today as well.   Lipid Management History:      Positive NCEP/ATP III risk factors include male  age 20 years old or older and HDL cholesterol less than 40.  Negative NCEP/ATP III risk factors include non-tobacco-user status.    Problems Prior to Update: 1)  Methicillin Resistant Staphylococcus Aureus Infection  (ICD-041.12) 2)  Peptic Ulcer, Acute, Hemorrhage  (ICD-533.00) 3)  Shoulder Pain, Right  (ICD-719.41) 4)  Inadequate Material Resources  (ICD-V60.2) 5)  Visual Acuity, Decreased  (ICD-369.9) 6)  Foot Pain, Bilateral  (ICD-729.5) 7)  Degenerative Joint Disease, Knees, Bilateral  (ICD-715.96) 8)  Need Prophylactic Vaccination&inoculation Flu  (ICD-V04.81) 9)  Sleep Apnea  (ICD-780.57) 10)  Anal and Rectal Polyp  (ICD-569.0) 11)  Risk of Sleep Apnea  (ICD-V12.59) 12)  Polycythemia  (ICD-289.0) 13)  Screening Colorectal-cancer  (ICD-V76.51) 14)  Gerd  (ICD-530.81) 15)  Hyperlipidemia  (ICD-272.4) 16)  Abscess, Tooth  (ICD-522.5) 17)  Insomnia  (ICD-780.52) 18)  Peripheral Neuropathy  (ICD-356.9) 19)  Mood Disorder in Conditions Classified Elsewhere  (ICD-293.83) 20)  Preventive Health Care  (ICD-V70.0) 21)  Disc Disease, Cervical  (ICD-722.4) 22)  Tinnitus  (ICD-388.30) 23)  Calculus of Kidney  (ICD-592.0) 24)  Pud  (ICD-533.90) 25)  Neural Hearing Loss Bilateral  (ICD-389.12) 26)  Conductive Hearing Loss Bilateral  (ICD-389.06) 27)  Other Specified Forms of Hearing Loss  (ICD-389.8) 28)  Disorder, Depressive Nec  (ICD-311) 29)  Tobacco User  (ICD-305.1) 30)  HIV Disease  (ICD-042)  Medications Prior to Update:  1)  Prilosec Otc 20 Mg  Tbec (Omeprazole Magnesium) .... Take Two Tablets Once Daily 2)  Intelence 100 Mg  Tabs (Etravirine) .... Take Two Tablets Twice Daily By Mouth 3)  Truvada 200-300 Mg  Tabs (Emtricitabine-Tenofovir) .... Take 1 Tablet By Mouth Once A Day 4)  Isentress 400 Mg  Tabs (Raltegravir Potassium) .... Take 1 Tablet By Mouth Two Times A Day 5)  Neurontin 300 Mg  Caps (Gabapentin) .... Take 1 Capsule By Mouth At Bedtime 6)  Wellbutrin Sr 150 Mg   Tb12 (Bupropion Hcl) .... Take 1 Tablet By Mouth Two Times A Day 7)  Fenofibrate 54 Mg  Tabs (Fenofibrate) .... Take 1 Tablet By Mouth Once Daily 8)  Gabapentin 300 Mg Caps (Gabapentin) .... Take One Tablet Three Times A Day 9)  Oxycodone Hcl 10 Mg Tabs (Oxycodone Hcl) .... Take One Tablet Up To Four Times Daily As Needed For Pain  Current Medications (verified): 1)  Prilosec Otc 20 Mg  Tbec (Omeprazole Magnesium) .... Take Two Tablets Once Daily 2)  Intelence 100 Mg  Tabs (Etravirine) .... Take Two Tablets Twice Daily By Mouth 3)  Truvada 200-300 Mg  Tabs (Emtricitabine-Tenofovir) .... Take 1 Tablet By Mouth Once A Day 4)  Isentress 400 Mg  Tabs (Raltegravir Potassium) .... Take 1 Tablet By Mouth Two Times A Day 5)  Neurontin 300 Mg  Caps (Gabapentin) .... Take 1 Capsule By Mouth At Bedtime 6)  Wellbutrin Sr 150 Mg  Tb12 (Bupropion Hcl) .... Take 1 Tablet By Mouth Two Times A Day 7)  Gabapentin 300 Mg Caps (Gabapentin) .... Take One Tablet Three Times A Day 8)  Oxycontin 15 Mg Xr12h-Tab (Oxycodone Hcl) .... Take 1 Tablet By Mouth Two Times A Day Regularly 9)  Oxycodone Hcl 5 Mg Tabs (Oxycodone Hcl) .... Take One Tablet As Needed Op To Four Times Daily For Breakthrough Pain 10)  Hibiclens 4 % Liqd (Chlorhexidine Gluconate) .... Wash Twice Daily For 7 Days, Dispense One Bottle 11)  Bactroban Nasal 2 % Oint (Mupirocin Calcium) .... Apply To Inside of Nares Twice Daily For  7 Days  Allergies (verified): 1)  ! Aleve 2)  ! Nsaids    Preventive Screening-Counseling & Management  Alcohol-Tobacco     Alcohol drinks/day: 0     Smoking Status: quit     Smoking Cessation Counseling: no     Packs/Day: 10 ciggs     Year Started: 30 years ago     Year Quit: 03/2008     Pack years: 1ppd     Passive Smoke Exposure: no  Caffeine-Diet-Exercise     Caffeine use/day: 4     Does Patient Exercise: yes     Type of exercise: walking     Times/week: 3  Hep-HIV-STD-Contraception     HIV Risk: no      HIV Risk Counseling: 09/01/2005  Safety-Violence-Falls     Seat Belt Use: 100      Drug Use:  no.     Current Allergies (reviewed today): ! ALEVE ! 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 Family History of Colon Polyps:  Social History: Last updated: 01/30/2009 Single  Alcohol use-no Drug use-no former alcohol and drug abuser quit 1998  drink at least 3 large coffees a day sometimes more Former Smoker  Risk Factors: Alcohol Use: 0 (10/04/2009) Caffeine Use: 4 (10/04/2009) Exercise: yes (10/04/2009)  Risk Factors: Smoking Status: quit (10/04/2009) Packs/Day: 10 ciggs (10/04/2009) Passive Smoke Exposure: no (10/04/2009)  Past Surgical History: DIskectomy C4-5 iwith fusion n 1990 C5-6 discketcomy and fusion 1996  Family History: Reviewed history from 04/21/2008 and no changes required. No early CAD. Family History of Colon Polyps:  Social History: Reviewed history from 01/30/2009 and no changes required. Single  Alcohol use-no Drug use-no former alcohol and drug abuser quit 1998 drink at least 3 large coffees a day sometimes more Former Smoker  Review of Systems  The patient denies anorexia, fever, weight loss, weight gain, vision loss, decreased hearing, hoarseness, chest pain, syncope, dyspnea on exertion, peripheral edema, prolonged cough, headaches, hemoptysis, abdominal pain, melena, hematochezia, severe indigestion/heartburn, hematuria, incontinence, muscle weakness, suspicious skin lesions, transient blindness, difficulty walking, depression, abnormal bleeding, and enlarged lymph nodes.    Vital Signs:  Patient  profile:   55 year old male Height:      70 inches (177.80 cm) Weight:      227 pounds (103.18 kg) BMI:     32.69 Temp:     97.5 degrees F (36.39 degrees C) oral Pulse rate:   89 / minute BP sitting:   118 / 76  (left arm)  Vitals Entered By: Starleen Arms CMA (October 04, 2009 9:57 AM) CC: f/u labs, Lipid Management Is Patient Diabetic? No Pain Assessment Patient in pain? yes     Location: feet, knees Intensity: 7 Type: aching Nutritional Status BMI of > 30 = obese Nutritional Status Detail nl  Does patient need assistance? Functional Status Self care Ambulation Normal   Physical Exam  General:  alert and overweight-appearing. well-nourished and well-hydrated.   Head:  normocephalic and atraumatic.   Eyes:  vision grossly intact, pupils equal, pupils round, and pupils reactive to light.   Ears:  no external deformities.  he is wearing bilateral hearing aids Nose:  no external deformity, no external erythema, and no nasal discharge.   Mouth:  pharynx pink and moist, no erythema, and no exudates.  Neck:  supple and full ROM.   Chest Wall:  no deformities.   Lungs:  normal respiratory effort, no crackles, and no wheezes.   Heart:  normal rate, regular rhythm, no murmur, no gallop, and no rub.   Abdomen:  no distention.  soft, non-tender, no masses, and no guarding.   Msk:  he is able to rotate shoulder upwards without difficulty and in fact with relief of pain Extremities:  No clubbing, cyanosis, edema,  Neurologic:  alert & oriented X3.  strength intact though he is slightly limited in movement in his shoulder Skin:  no rashes.  he has numbness in ulnar nerve distrubition but sensation is intact on exam Psych:  Oriented X3.  memory intact for recent and remote and normally interactive.          Medication Adherence: 10/04/2009   Adherence to medications reviewed with patient. Counseling to provide adequate adherence provided   Prevention For Positives: 10/04/2009    Safe sex practices discussed with patient. Condoms offered.   Education Materials Provided: 10/04/2009 Safe sex practices discussed with patient. Condoms offered.                          Impression & Recommendations:  Problem # 1:  HIV DISEASE (ICD-042)  I am happy with his virological control. COntinue current regimen. Diagnostics Reviewed:  HIV: HIV positive - not AIDS (10/16/2008)   CD4: 570 (09/04/2009)   WBC: 9.8 (09/03/2009)   Hgb: 16.2 (09/03/2009)   HCT: 49.4 (09/03/2009)   Platelets: 358 (09/03/2009) HIV-1 RNA: 161 (09/03/2009)   HBSAg: No (01/25/2007)  Orders: Est. Patient Level IV (16109)  Problem # 2:  SHOULDER PAIN, RIGHT (ICD-719.41)  IThis seems likely largely due to his cervcial disk disease. Will refer back to Dr. Jeral Fruit. In the meantime will pu thim on schduled long acting oxycontin with oxycodone for breakthrough. The following medications were removed from the medication list:    Oxycodone Hcl 10 Mg Tabs (Oxycodone hcl) .Marland Kitchen... Take one tablet up to four times daily as needed for pain His updated medication list for this problem includes:    Oxycontin 15 Mg Xr12h-tab (Oxycodone hcl) .Marland Kitchen... Take 1 tablet by mouth two times a day regularly    Oxycodone Hcl 5 Mg Tabs (Oxycodone hcl) .Marland Kitchen... Take one tablet as needed op to four times daily for breakthrough pain  Orders: Est. Patient Level IV (60454)  Problem # 3:  PEPTIC ULCER, ACUTE, HEMORRHAGE (ICD-533.00)  No recent recurrence but HE NEEDS TO AVOID NSAIDS! His updated medication list for this problem includes:    Prilosec Otc 20 Mg Tbec (Omeprazole magnesium) .Marland Kitchen... Take two tablets once daily  Orders: Est. Patient Level IV (09811)  Problem # 4:  HYPERLIPIDEMIA (ICD-272.4) At goal The following medications were removed from the medication list:    Fenofibrate 54 Mg Tabs (Fenofibrate) .Marland Kitchen... Take 1 tablet by mouth once daily  Orders: Est. Patient Level IV (99214)Future Orders: T-Lipid Profile  ([id number]) ... 01/02/2010  Problem # 5:  DISC DISEASE, CERVICAL (ICD-722.4) see above discussion re pain meds adn referral back to Dr. 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 He has been seen [facility] in [facility]up [facility] He now has medicare and could be plugged into local ENT MD as well.  Orders: Est. Patient Level IV (13086)  Problem # 7:  FOOT PAIN, BILATERAL (ICD-729.5)  has spurs. Need to check in on whehter he has seen podiatrist for this.  Orders: Est. Patient Level IV (57846)  Medications Added to Medication List This Visit: 1)  Oxycontin 15 Mg Xr12h-tab (Oxycodone hcl) .... Take 1 tablet by mouth two times a day regularly 2)  Oxycodone Hcl 5 Mg Tabs (Oxycodone hcl) .... Take one tablet as needed op to four times daily for breakthrough pain 3)  Hibiclens 4 % Liqd (Chlorhexidine gluconate) .... Wash twice daily for 7 days, dispense one bottle 4)  Bactroban Nasal 2 % Oint (Mupirocin calcium) .... Apply to inside of nares twice daily for  7 days  Other Orders: Future Orders: T-HIV Viral Load ([id number]) ... 01/02/2010 T-CBC w/Diff ([id number]) ... 01/02/2010 T-Comprehensive Metabolic Panel [phone]) ... 01/02/2010 T-CD4SP ([facility]) (CD4SP) ... 01/02/2010 T-CBC w/Diff ([id number]) ... 01/02/2010 T-RPR (Syphilis) [phone]) ... 01/02/2010  Lipid Assessment/Plan:      Based on NCEP/ATP III, the patient's risk factor category is "0-1 risk factors".  The patient's lipid goals are as follows: Total cholesterol goal is 200; LDL cholesterol goal is 130; HDL cholesterol goal is 40; Triglyceride goal is 150.     Patient Instructions: 1)  Please update our office on insurance information  (medicare a and b) 2)  Followup in 3 months  Prescriptions:  BACTROBAN NASAL 2 % OINT (MUPIROCIN CALCIUM) apply to inside of nares twice daily for  7 days  #30 x 2    Entered and Authorized by:   Acey Lav MD   Signed by:   Paulette Blanch Dam MD on 10/04/2009   Method used:   Print then Give to Patient   RxID:   [id number] HIBICLENS 4 % LIQD (CHLORHEXIDINE GLUCONATE) wash twice daily for 7 days, dispense one bottle  #1 x 0   Entered and Authorized by:   Acey Lav MD   Signed by:   Paulette Blanch Dam MD on 10/04/2009   Method used:   Print then Give to Patient   RxID:   [id number] OXYCODONE HCL 5 MG TABS (OXYCODONE HCL) take one tablet as needed op to four times daily for breakthrough pain  #60 x 0   Entered and Authorized by:   Acey Lav MD   Signed by:   Paulette Blanch Dam MD on 10/04/2009   Method used:   Print then Give to Patient   RxID:   [id number] OXYCONTIN 15 MG XR12H-TAB (OXYCODONE HCL) Take 1 tablet by mouth two times a day regularly  #32 x 0   Entered and Authorized by:   Acey Lav MD   Signed by:   Paulette Blanch Dam MD on 10/04/2009   Method used:   Print then Give to Patient   RxID:   [id number] OXYCODONE HCL 5 MG TABS (OXYCODONE HCL) take one tablet as needed op to four times daily for breakthrough pain  #60 x 0   Entered and Authorized by:   Acey Lav MD   Signed by:   Paulette Blanch Dam MD on 10/04/2009   Method used:   Print then Give to Patient   RxID:   [id number] OXYCONTIN 15 MG XR12H-TAB (OXYCODONE HCL) Take 1 tablet by mouth two times a day regularly  #32 x 0   Entered and Authorized by:   Acey Lav MD   Signed by:   Paulette Blanch Dam MD on 10/04/2009   Method used:   Print then Give to Patient   RxID:   [id number]  Process Orders Check Orders Results:     [facility]: Check successful Tests Sent for requisitioning (October 04, 2009 11:01 PM):     01/02/2010: [facility] -- T-HIV Viral Load [phone] (signed)     01/02/2010: [facility] -- T-CBC w/Diff [[id number]] (signed)     01/02/2010:  [facility] -- T-Comprehensive Metabolic Panel [[id number]] (signed)     01/02/2010: [facility] -- T-CBC w/Diff [[id number]] (signed)     01/02/2010: [facility] -- T-RPR (Syphilis) [phone] (signed)     01/02/2010: [facility] -- T-Lipid Profile [phone] (signed)   Process Orders Check Orders Results:     [facility]: Check successful Tests Sent for requisitioning (October 04, 2009 11:01 PM):     01/02/2010: [facility] -- T-HIV Viral Load [phone] (signed)     01/02/2010: [facility] -- T-CBC w/Diff [[id number]] (signed)     01/02/2010: [facility] -- T-Comprehensive Metabolic Panel [[id number]] (signed)     01/02/2010: [facility] -- T-CBC w/Diff [[id number]] (signed)     01/02/2010: [facility] -- T-RPR (Syphilis) [phone] (signed)     01/02/2010: [facility] -- T-Lipid Profile [phone] (signed)

## 2010-12-31 NOTE — Progress Notes (Signed)
Summary: pain rx refills  Phone Note Refill Request Message from:  Patient on May 27, 2010 10:49 AM     New/Updated Medications: OXYCODONE HCL 5 MG TABS (OXYCODONE HCL) take one tablet as needed up to four times daily for breakthrough pain Prescriptions: OXYCODONE HCL 5 MG TABS (OXYCODONE HCL) take one tablet as needed up to four times daily for breakthrough pain  #60 x 0   Entered by:   Jennet Maduro RN   Authorized by:   Acey Lav MD   Signed by:   Jennet Maduro RN on 05/27/2010   Method used:   Print then Give to Patient   RxID:   [id number] MS CONTIN 15 MG XR12H-TAB (MORPHINE SULFATE) Take 1 tablet by mouth two times a day  #62 x 0   Entered by:   Jennet Maduro RN   Authorized by:   Acey Lav MD   Signed by:   Jennet Maduro RN on 05/27/2010   Method used:   Print then Give to Patient   RxID:   [id number]   Appended Document: pain rx refills Pt. called to let him know that rx will be ready for pick-up on Friday, July 1st. .

## 2010-12-31 NOTE — Progress Notes (Signed)
Summary: vicodin 5/500 rx       New/Updated Medications: VICODIN 5-500 MG  TABS (HYDROCODONE-ACETAMINOPHEN) 1-2 tablets by mouth every 6 hours for pain   Prescriptions: VICODIN 5-500 MG  TABS (HYDROCODONE-ACETAMINOPHEN) 1-2 tablets by mouth every 6 hours for pain  #60 x 0   Entered by:   Jennet Maduro RN   Authorized by:   Acey Lav MD   Signed by:   Jennet Maduro RN on 12/31/2007   Method used:   Print then Give to Patient   RxID:   [id number]  Called prescription in to [facility] in [place], [place].  Informed pt. that rx was being called in to [facility]. ..................................................................Marland KitchenJennet Maduro RN  December 31, 2007 10:20 AM

## 2010-12-31 NOTE — Progress Notes (Signed)
Summary: coming for labs 12/22/07  Phone Note Outgoing Call   Call placed by: Acey Lav MD,  December 20, 2007 1:01 PM Summary of Call: I spoke with Hunter Pratt and he is going to come in tomorrow for labs. Would lke to check his RPR and a serum cryptococcal antigen. We are arranging appt for him at [facility] and he has been tentatively put into a spot on Wed January the 28th. Will fill in details including phone contact numbers in a bit.  Follow-up for Phone Call        Pt. to come in 12/22/07 for lab draw.   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

## 2010-12-31 NOTE — Miscellaneous (Signed)
Summary: clinical update/ryan white  Clinical Lists Changes  Observations: Added new observation of PAYOR: Medicare (05/07/2010 10:03) Added new observation of PCTFPL: 119.78  (05/07/2010 10:03) Added new observation of HOUSEINCOME: 84132  (05/07/2010 10:03) Added new observation of FINASSESSDT: 05/07/2010  (05/07/2010 10:03)     Paulo Fruit  BS,CPht II,MPH  May 07, 2010 10:03 AM

## 2010-12-31 NOTE — Assessment & Plan Note (Signed)
Summary: recheck cbc and talk w/ MD per Dr. Zenaida Niece Dam/dde   Chief Complaint:  f/u visit with Dr Daiva Eves.  History of Present Illness: 55 year old Caucasian male with HIV whom developed R> L hearing loss and was found to have bilateral sensorineural hearing loss and Conductive R>L hearing loss on audiology exam here. I gave him a rx for amoxicillin despite little evidnce for OM and started steroids. Unfortunately after intiating steroids he dveveloped abdominal pain in area where he prevoiiusly had pain with PUD (I was not aware of this dx). II stopped his ARV largely because his tinnitus was made worse after taking ATripla. Sinc then he no longer has the intensity fo tinnitus that he had before but symptom  does persist. He had suddenr return of much of his hearin on the R, then later dveloped fullness in the opposite ear. He has kindly been seen by Dr. Marlyne Beards at [facility] who found pt to have nml exm of tympanic membranes, evidnece of regurfitatio of acid in mouth, bilateral senorineural loss, and. . H eis scheduled for MRI and f.u visit with ENT in early March. Since I last saw him he was seen in ED and foudn to have kidney stone at the UV junction with hydronephroisis. I have rx him hydrocodone which he took a few pills. He stil has pain in his RLQ with occ radiation into groin but this is improved. He has been straing his urine and has not found stone yet. He still has intermittent pain in epigasstrium concernin for PUD> He has not started PPI because it has not yet come through [facility].   Current Allergies (reviewed today): ! ALEVE ! NSAIDS  Past Medical History:    HIV disease    Hep B    Recurrent bronchitis    PUD    Kidney stones recurrent    Risk Factors:  Tobacco use:  current    Year started:  30 years ago Drug use:  no Caffeine use:  2 drinks per day Alcohol use:  no Exercise:  no Seatbelt use:  100 %   Review of Systems       The patient complains of abdominal pain, melena,  severe indigestion/heartburn, and hematuria.  The patient denies anorexia, fever, and weight loss.     Vital Signs:  Patient Profile:   55 Years Old Male Height:     70 inches (177.80 cm) Weight:      241.7 pounds (109.86 kg) BMI:     34.81 Temp:     9.4 degrees F (-12.56 degrees C) oral Pulse rate:   86 / minute BP sitting:   127 / 96  Pt. in pain?   no  Vitals Entered By: Jennet Maduro RN (January 03, 2008 10:09 AM)              Is Patient Diabetic? No Nutritional Status BMI of 25 - 29 = overweight Nutritional Status Detail appetite "just a little hungry this morning"  Have you ever been in a relationship where you felt threatened, hurt or afraid?No   Does patient need assistance? Functional Status Self care Ambulation Normal     Physical Exam  General:     alert and overweight-appearing.   Head:     normocephalic and atraumatic.   Eyes:     vision grossly intact, pupils equal, pupils round, and pupils reactive to light.   Ears:     Nml TM Mouth:     good  dentition, pharynx pink and moist, and no erythema.   Chest Wall:     no deformities.   Lungs:     normal respiratory effort, no crackles, and no wheezes.   Heart:     normal rate, regular rhythm, no murmur, no gallop, and no rub.   Abdomen:     tenderness to palpatoin minmial in the epigastrium tenderness in RLQ no rebound, pos bs Rectal:     no external abnormalities.  guiac positive but not gross blood Extremities:     No clubbing, cyanosis, edema, or deformity noted with normal full range of motion of all joints.   Neurologic:     alert & oriented X3.   Skin:     no rashes.   Psych:     Oriented X3.      Impression & Recommendations:  Problem # 1:  HIV DISEASE (ICD-042) I have reviewed his new regimen with him. The following medications were removed from the medication list:    Amoxicillin 500 Mg Caps (Amoxicillin) .Marland Kitchen... Take 1 capsule by mouth two times a day for two weeks  His  updated medication list for this problem includes:    Intelence 100 Mg Tabs (Etravirine) .Marland Kitchen... Take two tablets twice daily by mouth    Truvada 200-300 Mg Tabs (Emtricitabine-tenofovir) .Marland Kitchen... Take 1 tablet by mouth once a day    Isentress 400 Mg Tabs (Raltegravir potassium) .Marland Kitchen... Take 1 tablet by mouth two times a day  Orders: Est. Patient Level IV (02542) Est. Patient Level IV (70623)  Future Orders: T-Comprehensive Metabolic Panel ([id number]) ... 02/07/2008 T-CBC w/Diff ([id number]) ... 02/07/2008 T-CD4 (69485-46270) ... 02/07/2008 T-HIV Viral Load [id number]) ... 02/07/2008   Problem # 2:  CALCULUS OF KIDNEY (ICD-592.0) R 3mm stone at UVJ junction with hydronephroisi and hydroureter on CT last week. Pt still with some mild pain in R lq and groin Orders: Urology Referral (Urology) Est. Patient Level IV (99371) Est. Patient Level IV (69678)   Problem # 3:  PUD (ICD-533.90)  His updated medication list for this problem includes:    Prilosec Otc 20 Mg Tbec (Omeprazole magnesium) .Marland Kitchen..Marland Kitchen Two tablets a day by mouth twice daily for a month, then two tablets once daily  Orders: Gastroenterology Referral (GI) Est. Patient Level IV (93810) T-CBC w/Diff ([id number]) Est. Patient Level IV (77824)   Problem # 4:  NEURAL HEARING LOSS BILATERAL (ICD-389.12) To see ENT and have MRI in March Orders: Est. Patient Level IV (23536) Est. Patient Level IV (14431)   Problem # 5:  CONDUCTIVE HEARING LOSS BILATERAL (ICD-389.06) largely improved to see ENT in March with MRI Orders: Est. Patient Level IV (54008) Est. 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 I do not think that sustiva caused the ongoing tinnitus but clearly exacerbated it. It has been removed adn new regimen should not exacerbate tinnitus. Orders: Est. Patient Level IV (50932)   Medications Added to Medication List This Visit: 1)  Prilosec Otc 20 Mg Tbec  (Omeprazole magnesium) .... Two tablets a day by mouth twice daily for a month, then two tablets once daily   Patient Instructions: 1)  Cancel FEbruary appt, Make f/u appt in late March. Come in for labs mid march    Prescriptions: PRILOSEC OTC 20 MG  TBEC (OMEPRAZOLE MAGNESIUM) two tablets a day by mouth twice daily for a month, then two tablets once daily  #124 x 4   Entered and  Authorized by:   Acey Lav MD   Signed by:   Paulette Blanch Dam MD on 01/03/2008   Method used:   Print then Give to Patient   RxID:   [id number]    Pt. does not have private insurance, Medicaid or Medicare.  He lives in [place]. and is therefore not eligible for [employer] for referrals to Gastroenterology and Urology.  Will wait for [facility] to come on monthly service to refer for GI. ..................................................................Marland KitchenJennet Maduro RN  January 03, 2008 3:53 PM

## 2010-12-31 NOTE — Progress Notes (Signed)
Summary: Pharmacy change   Phone Note Call from Patient   Summary of Call: [facility] does not have oxycontin.  They would need to order medication.  He would like to use another pharmacy who has the drug in stock . He will be switching to  [facility] in [place].  We will change drug contract to reflect pharmacy change. Tomasita Morrow RN  October 10, 2009 11:15 AM  Initial call taken by: Tomasita Morrow RN,  October 10, 2009 11:15 AM

## 2010-12-31 NOTE — Letter (Signed)
Summary: OV-11/16/2006  OV-11/16/2006   Imported By: Dorice Lamas 01/11/2007 14:02:03  _____________________________________________________________________  External Attachment:    Type:   Image     Comment:   External Document

## 2010-12-31 NOTE — Progress Notes (Signed)
Summary: Reordered Neurontin refill via PAP  Reordered Neurontin via [employer] to Care patient assistance program. The medication will arrived within 7-10 business days. Conf# [id number] Paulo Fruit  BS,CPht II,MPH  Apr 16, 2009 12:10 PM

## 2010-12-31 NOTE — Assessment & Plan Note (Signed)
Summary: resfrom 6/24kam   Chief Complaint:  check-up.  Current Allergies (reviewed today): ! ALEVE (NAPROXEN SODIUM TABS) ! NSAIDS    Risk Factors:  Tobacco use:  current    Counseled to quit/cut down tobacco use:  yes    Vital Signs:  Patient Profile:   55 Years Old Male Weight:      227.5 pounds (103.41 kg) Temp:     97.3 degrees F (36.28 degrees C) oral Pulse rate:   84 / minute BP sitting:   121 / 82  (right arm)  Pt. in pain?   no  Vitals Entered By: Jennet Maduro RN (May 18, 2007 3:05 PM)              Is Patient Diabetic? No Nutritional Status BMI of 25 - 29 = overweight Nutritional Status Detail appetite "good"  Have you ever been in a relationship where you felt threatened, hurt or afraid?yes, a long time ago   Does patient need assistance? Functional Status Self care Ambulation Normal  Prescriptions: TRUVADA 200-300 MG TABS (EMTRICITABINE-TENOFOVIR) Take 1 tablet by mouth once a day  #100 x 12   Entered and Authorized by:   Lenn Sink MD   Signed by:   Lenn Sink MD on 05/18/2007   Method used:   Print then Give to Patient   RxID:   [id number] KALETRA 200-50 MG TABS (LOPINAVIR-RITONAVIR) Take 2 tablets by mouth two times a day  #400 x 12   Entered and Authorized by:   Lenn Sink MD   Signed by:   Lenn Sink MD on 05/18/2007   Method used:   Print then Give to Patient   RxID:   [id number]      Impression & Recommendations:  Problem # 1:  HIV DISEASE (ICD-042)  His updated medication list for this problem includes:    Kaletra 200-50 Mg Tabs (Lopinavir-ritonavir) .Marland Kitchen... Take 2 tablets by mouth two times a day    Truvada 200-300 Mg Tabs (Emtricitabine-tenofovir) .Marland Kitchen... Take 1 tablet by mouth once a day  Orders: Est. Patient Level III (93235)  Future Orders: T-Comprehensive Metabolic Panel ([id number]) ... 08/30/2007 T-CBC w/Diff ([id number]) ... 08/30/2007 T-CD4 (15176-16073) ... 08/30/2007 T-HIV Viral  Load [phone]) ... 08/30/2007    please see scanned note from dictation [phone]

## 2010-12-31 NOTE — Miscellaneous (Signed)
Summary: [facility]  [facility]   Imported By: Florinda Marker 06/12/2009 14:10:41  _____________________________________________________________________  External Attachment:    Type:   Image     Comment:   External Document

## 2010-12-31 NOTE — Progress Notes (Signed)
Summary: request for rf on pain med  Phone Note Refill Request Message from:  Patient on May 02, 2010 4:27 PM  Refills Requested: Medication #1:  OXYCODONE HCL 5 MG TABS take one tablet as needed op to four times daily for breakthrough pain   Last Refilled: 04/02/2010  Medication #2:  MS CONTIN 15 MG XR12H-TAB Take 1 tablet by mouth two times a day fill on April the 2nd  Method Requested: Pick up at Office Initial call taken by: Jennet Maduro RN,  May 02, 2010 4:28 PM Caller: Patient Call For: Paulette Blanch Dam MD Summary of Call: Patient calling for rf on pain medications, want to p/u on Friday(tomorrow). Initial call taken by: Starleen Arms CMA,  May 02, 2010 2:13 PM  Follow-up for Phone Call        Tamika I wrote scripts by hand and am leaving in IM office on [employer] desk (I was at hospital till 145am so may come in later tomorrow) Can you pick them up or see if someone, Roddie Mc for exm can bring them to you? if I come in later. Follow-up by: Acey Lav MD,  May 03, 2010 1:36 AM    New/Updated Medications: OXYCODONE HCL 5 MG TABS (OXYCODONE HCL) take one tablet as needed op to four times daily for breakthrough pain MS CONTIN 15 MG XR12H-TAB (MORPHINE SULFATE) Take 1 tablet by mouth two times a day Prescriptions: OXYCODONE HCL 5 MG TABS (OXYCODONE HCL) take one tablet as needed op to four times daily for breakthrough pain  #0 x 0   Entered by:   Starleen Arms CMA   Authorized by:   Acey Lav MD   Signed by:   Starleen Arms CMA on 05/03/2010   Method used:   Handwritten   RxID:   [id number] MS CONTIN 15 MG XR12H-TAB (MORPHINE SULFATE) Take 1 tablet by mouth two times a day  #0 x 0   Entered by:   Starleen Arms CMA   Authorized by:   Acey Lav MD   Signed by:   Starleen Arms CMA on 05/03/2010   Method used:   Handwritten   RxID:   [id number]

## 2010-12-31 NOTE — Miscellaneous (Signed)
Summary: clinical update/ryan white [facility] update  Clinical Lists Changes  Observations: Added new observation of AIDSDAP: Yes 2010 (02/15/2009 11:08) Added new observation of RW VITAL STA: Active (02/15/2009 11:08)

## 2010-12-31 NOTE — Consult Note (Signed)
Summary: Audiological Evaluation  Audiological Evaluation   Imported By: Randon Goldsmith 12/30/2007 10:37:34  _____________________________________________________________________  External Attachment:    Type:   Image     Comment:   External Document

## 2010-12-31 NOTE — Miscellaneous (Signed)
Summary: [facility]  [facility]   Imported By: Florinda Marker 02/12/2009 15:54:41  _____________________________________________________________________  External Attachment:    Type:   Image     Comment:   External Document

## 2010-12-31 NOTE — Assessment & Plan Note (Signed)
Summary: FU OV/VS   PCP:  Acey Lav, M.D.  Chief Complaint:  f/u.  History of Present Illness: 55 year old with HIV  cd4 700 most recent viral load of 233 on intelence, raltegravir, truvda. Patient still with tinnitus but wihtout the spike in severity associated with atripla at night. His recent course of ARV treatment is documented in my prior notes. He claims to have missed only one or two doses in the past several months. He is not sexually active. HE has undergone colonoscopy with polyps and will require repeat colonoscopy in 5 years, he has gastritis on EGD. He has had hearing aides fitted for his hearing loss at [facility]. Otherwise he has not specific compliants today.    Prior Medications Reviewed Using: Patient Recall  Updated Prior Medication List: PRILOSEC OTC 20 MG  TBEC (OMEPRAZOLE MAGNESIUM) take two tablets once daily INTELENCE 100 MG  TABS (ETRAVIRINE) take two tablets twice daily by mouth TRUVADA 200-300 MG  TABS (EMTRICITABINE-TENOFOVIR) Take 1 tablet by mouth once a day ISENTRESS 400 MG  TABS (RALTEGRAVIR POTASSIUM) Take 1 tablet by mouth two times a day NEURONTIN 300 MG  CAPS (GABAPENTIN) Take 1 capsule by mouth at bedtime WELLBUTRIN SR 150 MG  TB12 (BUPROPION HCL) Take 1 tablet by mouth two times a day FENOFIBRATE 54 MG  TABS (FENOFIBRATE) take 1 tablet by mouth once daily  Current Allergies (reviewed today): ! ALEVE ! 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       Former drug abuser quit 10 years ago       Hyperlipidemia       depression       Sleep apnea   Social History:    Reviewed history from 04/21/2008 and no changes required:       Single       Current Smoker   Alcohol use-no       Drug use-no       former alcohol and drug abuser quit 1998       drink at least 3 large coffees a day sometimes more   Risk Factors:  Tobacco use:  quit    Year quit:  03/2008   Review of Systems  The patient denies anorexia, fever, weight loss, weight gain, vision loss, decreased hearing, hoarseness, chest pain, dyspnea on exertion, peripheral edema, prolonged cough, hemoptysis, abdominal pain, melena, and depression.     Vital Signs:  Patient Profile:   55 Years Old Male Height:     70 inches (177.80 cm) Weight:      246.19 pounds (111.90 kg) BMI:     35.45 Temp:     97.7 degrees F (36.50 degrees C) oral Pulse rate:   86 / minute BP sitting:   135 / 89  (left arm)  Pt. in pain?   no  Vitals Entered By: Starleen Arms CMA (May 22, 2008 9:31 AM)  Is Patient Diabetic? No Nutritional Status BMI of > 30 = obese Nutritional Status Detail eating well  Have you ever been in a relationship where you felt threatened, hurt or afraid?No   Does patient need assistance? Functional Status Self care Ambulation Normal     Physical Exam  General:     alert and overweight-appearing.   Head:     normocephalic and atraumatic.   Mouth:     pharynx pink and moist, no erythema, and no exudates.  now edentulous Lungs:     normal respiratory effort, no crackles, and no wheezes.   Heart:     normal rate, regular rhythm, no murmur, no gallop, and no rub.   Abdomen:     soft, non-tender, normal bowel sounds, and no distention.   Neurologic:     alert & oriented X3.  heariung improved with aides in place. Psych:     Oriented X3.      Impression & Recommendations:  Problem # 1:  HIV DISEASE (ICD-042) COntinue the patient on raltegravir, truvada, isentress for now. If he continuest ohave low grade viremia may need to  consider changing him back to a protease inhibitor based regimen. W The following medications were removed from the  medication list:    Isentress 400 Mg Tabs (Raltegravir potassium)  His updated medication list for this problem includes:    Intelence 100 Mg Tabs (Etravirine) .Marland Kitchen... Take two tablets twice daily by mouth    Truvada 200-300 Mg Tabs (Emtricitabine-tenofovir) .Marland Kitchen... Take 1 tablet by mouth once a day    Isentress 400 Mg Tabs (Raltegravir potassium) .Marland Kitchen... Take 1 tablet by mouth two times a day  Orders: T-Chlamydia  Probe, urine ([id number])   Problem # 2:  POLYCYTHEMIA (ICD-289.0) Unclear what is causing this. He has stopped smoking. He does have snoring at night and some daytime sleepiness. WIll recheck cbc, epogen level, ua, sleep study. Orders: Est. Patient Level IV (09811)   Problem # 3:  GERD (ICD-530.81) controlled back downt to 40mg  a day His updated medication list for this problem includes:    Prilosec Otc 20 Mg Tbec (Omeprazole magnesium) .Marland Kitchen... Take two tablets once daily  Orders: Est. Patient Level IV (91478)   Problem # 4:  RISK OF SLEEP APNEA (ICD-V12.59) see above will screen for osa. Orders: Sleep Disorder Referral (Sleep Disorder)   Problem # 5:  ABSCESS, TOOTH (ICD-522.5) sp removal of teeth, resolved.  Problem # 6:  CONDUCTIVE HEARING LOSS BILATERAL (ICD-389.06) now with hearing aides  Problem # 7:  SCREENING COLORECTAL-CANCER (ICD-V76.51) polyps wil need colonscopy repeat in 5 years. Orders: Est. Patient Level IV (29562)   Medications Added to Medication List This Visit: 1)  Prilosec Otc 20 Mg Tbec (Omeprazole magnesium) .... Take two tablets once daily 2)  Wellbutrin Sr 150 Mg Tb12 (Bupropion hcl) .... Take 1 tablet by mouth two times a day 3)  Fenofibrate 54 Mg Tabs (Fenofibrate) .... Take 1 tablet by mouth once daily  Other Orders: T-GC Probe, urine ([id number]) Hepatitis A Vaccine (Adult Dose) (96295) Admin 1st Vaccine (28413)  Future Orders: T-HIV Viral Load ([id number]) ... 08/20/2008 T-CD4 (36644-03474) ... 08/20/2008 T-Comprehensive  Metabolic Panel [id number]) ... 08/20/2008 T-Lipid Profile [id number]) ... 08/20/2008 T-CBC w/Diff ([id number]) ... 08/20/2008 T-CD4 (10932-35573) ... 08/20/2008     Patient Instructions: 1)  Please schedule a follow-up appointment in 2-3 months. 2)  I am going to schedule a sleep study for you.   Prescriptions: FENOFIBRATE 54 MG  TABS (FENOFIBRATE) take  1 tablet by mouth once daily  #30 x 11   Entered by:   Luan Pulling PHARM D   Authorized by:   Acey Lav MD   Signed by:   Paulette Blanch Dam MD on 05/22/2008   Method used:   Electronically sent to ...       [facility]  [address]       [address]       [place], [place]  [address]       Ph: [phone]       Fax: [phone]   RxID:   [id number]  ]  Hepatitis A Vaccine # 2    Vaccine Type: HepA    Site: left deltoid    Mfr: havrix    Dose: 0.5 ml    Route: IM    Given by: Starleen Arms CMA    Exp. Date: 05/24/2010    Lot #: [id number]    VIS given: 02/18/05 version given May 22, 2008.

## 2010-12-31 NOTE — Miscellaneous (Signed)
Summary: Yearly Financial Assessment Check  Clinical Lists Changes  Observations: Added new observation of MARITAL STAT: Divorced (11/29/2007 17:36) Added new observation of HOUSEINCOME: 4734  (11/29/2007 17:36)

## 2010-12-31 NOTE — Progress Notes (Signed)
Summary: requesting rx for pain med/TY  Phone Note Call from Patient   Caller: Patient Summary of Call: Patient requesting rx for pain medication, and would like to p/u tomorrow or Friday. Initial call taken by: Starleen Arms CMA,  October 23, 2009 2:18 PM  Follow-up for Phone Call        Tamika, how much pain medicine is he using. He just got script for narcotics on the 4th? He should only be out of the breakthrough meds.I am happy to leav him script for tomorrow but I need toknow how much he is using of thes meds Follow-up by: Acey Lav MD,  October 23, 2009 2:35 PM  Additional Follow-up for Phone Call Additional follow up Details #1::        I am sorry I messed up  the amt of pills on him. Will correct his dose of long acting drug Additional Follow-up by: Acey Lav MD,  October 24, 2009 2:02 PM    Prescriptions: OXYCODONE HCL 5 MG TABS (OXYCODONE HCL) take one tablet as needed op to four times daily for breakthrough pain  #60 x 0   Entered by:   Starleen Arms CMA   Authorized by:   Acey Lav MD   Signed by:   Starleen Arms CMA on 10/24/2009   Method used:   Print then Give to Patient   RxID:   [id number] OXYCONTIN 15 MG XR12H-TAB (OXYCODONE HCL) Take 1 tablet by mouth two times a day regularly  #62 x 0   Entered and Authorized by:   Acey Lav MD   Signed by:   Starleen Arms CMA on 10/24/2009   Method used:   Print then Give to Patient   RxID:   [id number]

## 2010-12-31 NOTE — Assessment & Plan Note (Signed)
Summary: swollen glands   Primary Provider:  Acey Lav, M.D.  CC:  swollen glands.  History of Present Illness: Pt here for swollen gland in right cervical area associated with low grade fever.  No pain with palpation of node. No ear pain or throat pain.  pt is edentulous.  Preventive Screening-Counseling & Management  Alcohol-Tobacco     Alcohol drinks/day: 0     Smoking Status: quit     Smoking Cessation Counseling: no     Packs/Day: 10 ciggs     Year Started: 30 years ago     Year Quit: 03/2008     Pack years: 1ppd     Passive Smoke Exposure: no  Caffeine-Diet-Exercise     Caffeine use/day: coffee,tea and sodas sometimes     Does Patient Exercise: yes     Type of exercise: walking     Exercise (avg: min/session): 30-60     Times/week: 3  Safety-Violence-Falls     Seat Belt Use: yes   Updated Prior Medication List: PRILOSEC OTC 20 MG  TBEC (OMEPRAZOLE MAGNESIUM) take two tablets once daily INTELENCE 100 MG  TABS (ETRAVIRINE) take two tablets twice daily by mouth TRUVADA 200-300 MG  TABS (EMTRICITABINE-TENOFOVIR) Take 1 tablet by mouth once a day ISENTRESS 400 MG  TABS (RALTEGRAVIR POTASSIUM) Take 1 tablet by mouth two times a day GABAPENTIN 300 MG CAPS (GABAPENTIN) take one tablet three times a day OXYCODONE HCL 5 MG TABS (OXYCODONE HCL) take one tablet as needed op to four times daily for breakthrough pain, fill on April the 2nd, 2011 BACTROBAN NASAL 2 % OINT (MUPIROCIN CALCIUM) apply to inside of nares twice daily for  7 days MS CONTIN 15 MG XR12H-TAB (MORPHINE SULFATE) Take 1 tablet by mouth two times a day fill on April the 2nd, 2011 KEFLEX 500 MG CAPS (CEPHALEXIN) Take 1 tablet by mouth four times a day  Current Allergies (reviewed today): ! ALEVE ! 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  The patient denies anorexia, weight loss, and headaches.    Vital Signs:  Patient profile:   55 year old male Height:      70 inches (177.80 cm) Weight:      215.8 pounds (98.09 kg) BMI:     31.08 Temp:     99.4 degrees F (37.44 degrees C) oral Pulse rate:   92 / minute BP sitting:   127 / 92  (right arm)  Vitals Entered By: Baxter Hire) (Apr 26, 2010 2:23 PM) CC: swollen glands Pain Assessment Patient in pain? yes     Location: right side of neck Type: swollen Onset of pain  knot on right side of neck x 3 days Nutritional Status BMI of > 30 = obese Nutritional Status Detail appetite is super per patient  Have you ever been in a relationship where you felt threatened, hurt or afraid?No   Does patient need assistance? Functional Status Self care Ambulation Normal   Physical Exam  General:  alert, well-developed, well-nourished, and well-hydrated.   Head:  normocephalic and atraumatic.   Ears:  R ear normal.   Mouth:  pharynx pink and moist.   Neck:  quarter sized soft lymph node in right anterior cervical area   Impression & Recommendations:  Problem # 1:  CERVICAL LYMPHADENITIS (ICD-683) appears benign will treat with keflex His updated medication list for this problem includes:    Keflex 500 Mg Caps (Cephalexin) .Marland Kitchen... Take 1 tablet by mouth four times a day  Orders: Est. Patient Level III ([address]109)  Medications Added to Medication List This Visit: 1)  Keflex 500 Mg Caps (Cephalexin) .... Take 1 tablet by mouth four times a day Prescriptions: KEFLEX 500 MG CAPS (CEPHALEXIN) Take 1 tablet by mouth four times a day  #28 x 0   Entered and Authorized by:   Yisroel Ramming MD   Signed by:   Yisroel Ramming MD on 04/26/2010   Method used:   Print then Give to  Patient   RxID:   [id number]

## 2010-12-31 NOTE — Miscellaneous (Signed)
Summary: clinical update/ryan white  Clinical Lists Changes  Observations: Added new observation of PAYOR: NONE (02/09/2007 10:16) Added new observation of HOUSEINCOME: 4734  (02/09/2007 10:16) Added new observation of FAMILYSIZE: 1  (02/09/2007 10:16) Added new observation of FINASSESSDT: 01/28/2007  (02/09/2007 10:16) Added new observation of YEARLYEXPEN: 30  (02/09/2007 10:16) Added new observation of REC_MESSAGE: Yes  (02/09/2007 10:16) Added new observation of RECPHONECALL: Yes  (02/09/2007 10:16)    Can the patient get phone calls?: Yes Can the patient receive messages?: Yes              Financial Yearly Expenses: 30 Date Expenses: 01/28/2007    Number in Household: 1    Household Income: [phone]       Payment Terms / Payor: NONE                         DATE  POS  TITER  TX?  Syphilis:  Gonorrhea:     Chlamydia:  Genital Herpes:        DATE  POS  Hep A:   Vaccines / Physicals  Pregnancy Information        Opportunistic Infections    YEAR   PPD Status        Treatment Information                 Last Education Dates           Current HIV Regimen  Referrals

## 2011-01-02 NOTE — Miscellaneous (Signed)
Summary: rxes info updated  Clinical Lists Changes  Medications: Changed medication from OXYCODONE HCL 5 MG TABS (OXYCODONE HCL) take one tablet as needed up to four times daily for breakthrough pain fill on July 27th to OXYCODONE HCL 5 MG TABS (OXYCODONE HCL) take one tablet as needed up to four times daily for breakthrough pain Changed medication from MS CONTIN 15 MG XR12H-TAB (MORPHINE SULFATE) Take 1 tablet by mouth two times a day may be filled on Aug 27th to MS CONTIN 15 MG XR12H-TAB (MORPHINE SULFATE) Take 1 tablet by mouth two times a day

## 2011-01-20 ENCOUNTER — Encounter (INDEPENDENT_AMBULATORY_CARE_PROVIDER_SITE_OTHER): Payer: Self-pay | Admitting: *Deleted

## 2011-01-28 NOTE — Miscellaneous (Signed)
  Clinical Lists Changes 

## 2011-02-12 ENCOUNTER — Encounter: Payer: Self-pay | Admitting: Infectious Disease

## 2011-02-12 ENCOUNTER — Other Ambulatory Visit (INDEPENDENT_AMBULATORY_CARE_PROVIDER_SITE_OTHER): Payer: Medicare Other

## 2011-02-12 ENCOUNTER — Encounter (INDEPENDENT_AMBULATORY_CARE_PROVIDER_SITE_OTHER): Payer: Self-pay | Admitting: *Deleted

## 2011-02-12 ENCOUNTER — Other Ambulatory Visit: Payer: Self-pay | Admitting: Infectious Disease

## 2011-02-12 DIAGNOSIS — B20 Human immunodeficiency virus [HIV] disease: Secondary | ICD-10-CM

## 2011-02-12 LAB — CONVERTED CEMR LAB
ALT: 22 units/L (ref 0–53)
AST: 17 units/L (ref 0–37)
Albumin: 4.4 g/dL (ref 3.5–5.2)
Alkaline Phosphatase: 127 units/L — ABNORMAL HIGH (ref 39–117)
BUN: 10 mg/dL (ref 6–23)
Basophils Absolute: 0.1 10*3/uL (ref 0.0–0.1)
Basophils Relative: 1 % (ref 0–1)
CO2: 28 meq/L (ref 19–32)
Calcium: 9.8 mg/dL (ref 8.4–10.5)
Chloride: 100 meq/L (ref 96–112)
Cholesterol: 151 mg/dL (ref 0–200)
Creatinine, Ser: 0.89 mg/dL (ref 0.40–1.50)
Eosinophils Absolute: 0.3 10*3/uL (ref 0.0–0.7)
Eosinophils Relative: 3 % (ref 0–5)
Glucose, Bld: 124 mg/dL — ABNORMAL HIGH (ref 70–99)
HCT: 51.1 % (ref 39.0–52.0)
HDL: 42 mg/dL (ref >39)
HIV 1 RNA Quant: <20 copies/mL (ref <20)
HIV-1 RNA Quant, Log: <1.3 (ref <1.30)
Hemoglobin: 17.3 g/dL — ABNORMAL HIGH (ref 13.0–17.0)
LDL Cholesterol: 83 mg/dL (ref 0–99)
Lymphocytes Relative: 24 % (ref 12–46)
Lymphs Abs: 2.6 10*3/uL (ref 0.7–4.0)
MCHC: 33.9 g/dL (ref 30.0–36.0)
MCV: 96.6 fL (ref 78.0–100.0)
Monocytes Absolute: 1.1 10*3/uL — ABNORMAL HIGH (ref 0.1–1.0)
Monocytes Relative: 10 % (ref 3–12)
Neutro Abs: 6.8 10*3/uL (ref 1.7–7.7)
Neutrophils Relative %: 63 % (ref 43–77)
Platelets: 290 10*3/uL (ref 150–400)
Potassium: 4.1 meq/L (ref 3.5–5.3)
RBC: 5.29 M/uL (ref 4.22–5.81)
RDW: 14.2 % (ref 11.5–15.5)
Sodium: 139 meq/L (ref 135–145)
Total Bilirubin: 0.4 mg/dL (ref 0.3–1.2)
Total CHOL/HDL Ratio: 3.6
Total Protein: 7.6 g/dL (ref 6.0–8.3)
Triglycerides: 132 mg/dL (ref <150)
VLDL: 26 mg/dL (ref 0–40)
WBC: 10.8 10*3/uL — ABNORMAL HIGH (ref 4.0–10.5)

## 2011-02-13 LAB — T-HELPER CELL (CD4) - (RCID CLINIC ONLY)
CD4 % Helper T Cell: 22 % — ABNORMAL LOW (ref 33–55)
CD4 T Cell Abs: 590 uL (ref 400–2700)

## 2011-02-16 LAB — T-HELPER CELL (CD4) - (RCID CLINIC ONLY)
CD4 % Helper T Cell: 21 % — ABNORMAL LOW (ref 33–55)
CD4 T Cell Abs: 690 uL (ref 400–2700)

## 2011-02-18 NOTE — Miscellaneous (Signed)
  Clinical Lists Changes  Observations: Added new observation of AIDSDAP: PENDING 2012 APPROVAL (02/12/2011 10:45) Added new observation of PCTFPL: 120.86  (02/12/2011 10:45) Added new observation of INCOMESOURCE: SSI  (02/12/2011 10:45) Added new observation of HOUSEINCOME: 16109  (02/12/2011 10:45) Added new observation of FINASSESSDT: 02/12/2011  (02/12/2011 10:45)

## 2011-02-19 LAB — T-HELPER CELL (CD4) - (RCID CLINIC ONLY)
CD4 % Helper T Cell: 23 % — ABNORMAL LOW (ref 33–55)
CD4 T Cell Abs: 640 uL (ref 400–2700)

## 2011-03-06 LAB — T-HELPER CELL (CD4) - (RCID CLINIC ONLY)
CD4 % Helper T Cell: 23 % — ABNORMAL LOW (ref 33–55)
CD4 T Cell Abs: 570 uL (ref 400–2700)

## 2011-03-10 ENCOUNTER — Other Ambulatory Visit: Payer: Self-pay

## 2011-03-11 LAB — T-HELPER CELL (CD4) - (RCID CLINIC ONLY)
CD4 % Helper T Cell: 22 % — ABNORMAL LOW (ref 33–55)
CD4 T Cell Abs: 680 uL (ref 400–2700)

## 2011-03-18 LAB — T-HELPER CELL (CD4) - (RCID CLINIC ONLY)
CD4 % Helper T Cell: 22 % — ABNORMAL LOW (ref 33–55)
CD4 T Cell Abs: 710 uL (ref 400–2700)

## 2011-03-19 ENCOUNTER — Encounter: Payer: Self-pay | Admitting: Infectious Disease

## 2011-03-19 ENCOUNTER — Ambulatory Visit (INDEPENDENT_AMBULATORY_CARE_PROVIDER_SITE_OTHER): Payer: Medicare Other | Admitting: Infectious Disease

## 2011-03-19 DIAGNOSIS — F3289 Other specified depressive episodes: Secondary | ICD-10-CM

## 2011-03-19 DIAGNOSIS — R739 Hyperglycemia, unspecified: Secondary | ICD-10-CM

## 2011-03-19 DIAGNOSIS — Z21 Asymptomatic human immunodeficiency virus [HIV] infection status: Secondary | ICD-10-CM

## 2011-03-19 DIAGNOSIS — F17200 Nicotine dependence, unspecified, uncomplicated: Secondary | ICD-10-CM

## 2011-03-19 DIAGNOSIS — Z113 Encounter for screening for infections with a predominantly sexual mode of transmission: Secondary | ICD-10-CM

## 2011-03-19 DIAGNOSIS — F0630 Mood disorder due to known physiological condition, unspecified: Secondary | ICD-10-CM

## 2011-03-19 DIAGNOSIS — B20 Human immunodeficiency virus [HIV] disease: Secondary | ICD-10-CM

## 2011-03-19 DIAGNOSIS — F32A Depression, unspecified: Secondary | ICD-10-CM

## 2011-03-19 DIAGNOSIS — R7309 Other abnormal glucose: Secondary | ICD-10-CM

## 2011-03-19 DIAGNOSIS — F329 Major depressive disorder, single episode, unspecified: Secondary | ICD-10-CM

## 2011-03-19 DIAGNOSIS — K219 Gastro-esophageal reflux disease without esophagitis: Secondary | ICD-10-CM

## 2011-03-19 DIAGNOSIS — Z139 Encounter for screening, unspecified: Secondary | ICD-10-CM

## 2011-03-19 LAB — POCT GLYCOSYLATED HEMOGLOBIN (HGB A1C): Hemoglobin A1C: 5.6

## 2011-03-19 MED ORDER — CITALOPRAM HYDROBROMIDE 20 MG PO TABS: ORAL_TABLET | ORAL | Status: DC

## 2011-03-19 MED ORDER — INTELENCE 200 MG PO TABS: 200.0000 mg | ORAL_TABLET | Freq: Two times a day (BID) | ORAL | Status: DC

## 2011-03-19 MED ORDER — ETRAVIRINE 200 MG PO TABS: 200.0000 mg | ORAL_TABLET | Freq: Two times a day (BID) | ORAL | Status: DC

## 2011-03-19 NOTE — Assessment & Plan Note (Signed)
Claims to have stopped.

## 2011-03-19 NOTE — Assessment & Plan Note (Signed)
Start Celexa. Patient is contracting for safety.

## 2011-03-19 NOTE — Assessment & Plan Note (Signed)
Continue current regimen

## 2011-03-19 NOTE — Assessment & Plan Note (Signed)
Continue omeprazole 

## 2011-03-19 NOTE — Progress Notes (Signed)
  Subjective:    Patient ID: Hunter Pratt, male    DOB: Nov 29, 1956, 55 y.o.   MRN: [id number]  HPI  History of Present Illness:  55 yo with HIV with undetectable Viral load and healthy CD4 count.  He feels a bit down today. He says there is a lot on his mind.He would not elaborate further. He has having is having trouble with sleep as well. He was open to being treated with an SSRI He denies active or passive suicidal ideation. I have offered celexa. He has contracted for safety. He has noted medicine area on his foot that might be ringworm. He has not tried any antifungal agents for it yet. We will try clotrimazole for the ring worm. otherwise he appears to be doing relatively well. I did spend over 45 minutes with him including greater than 50% of time and counseling of the patient coordination of care.Review of Systemsas per history of present illness. Otherwise 12 point review of systems negative.     Objective:   Physical Exam Hunter Pratt is alert oriented x4. HEENT has hearing aids in bilaterally. His pupils were equal round and light his sclera anicteric his oropharynx clear his neck supple without lymphadenopathy.  Her exam revealed a regular rate and rhythm no murmurs or rubs lungs are clear ossification bilaterally without wheezes or rales abdomen soft nondistended nontender. Extremities no edema skin he had a circular lesion on his ankle that was consistent with ringworm.. Neurological exam his strength was symmetric his sensation is intact he his gait was normal. Psychiatric he was dysphoric. His thought process though was linear and he was goal-directed.       Assessment & Plan:  HIV DISEASE Continue current regimen.  MOOD DISORDER IN CONDITIONS CLASSIFIED ELSEWHERE Start Celexa. Patient is contracting for safety.  TOBACCO USER Claims to have stopped.  GERD Continue omeprazole.

## 2011-03-24 ENCOUNTER — Ambulatory Visit: Payer: Medicare Other | Admitting: Infectious Disease

## 2011-04-15 NOTE — Procedures (Signed)
NAME:  Hunter Pratt, Hunter Pratt                   ACCOUNT NO.:  [id number]   MEDICAL RECORD NO.:  [id number]          PATIENT TYPE:  OUT   LOCATION:  SLEE                          FACILITY:  [facility]   PHYSICIAN:  Kofi A. Gerilyn Pilgrim, M.D. DATE OF BIRTH:  04/09/1956   DATE OF PROCEDURE:  11/08/2008  DATE OF DISCHARGE:  11/08/2008                             SLEEP DISORDER REPORT   REFERRING PHYSICIANS:  Lina Sayre and Acey Lav.   INDICATIONS:  A 55 year old man who presents with snoring, fatigue and  hypersomnia.  He is being evaluated for obstructive sleep apnea  syndrome.   MEDICATIONS:  Neurontin, fenofibrate, Wellbutrin, Prilosec OTC.   Epworth sleepiness scale is 7, BMI 31.   ARCHITECTURAL SUMMARY:  This is a full diagnostic nocturnal study.  The  total recording time is 354 minutes.  Sleep efficiency 54%.  Sleep  latency 10 minutes, REM latency 23 minutes.  Stage N1 35%, N2 42%, N3 3%  and REM sleep 20%.   RESPIRATORY SUMMARY:  Baseline oxygen saturation 95%.  Lowest saturation  84%.  AHI 13.   LIMB MOVEMENT SUMMARY:  PLM index 45.   ELECTROCARDIOGRAM SUMMARY:  Average heart rate 74 with no significant  dysrhythmias observed.   IMPRESSION:  1. Severe periodic limb movement disorder of sleep.  2. Mild obstructive sleep apnea syndrome not meeting criteria for a      split night study, however, the patient could benefit from positive      pressure.  3. Abnormal architecture with poor sleep efficiency and frequent      arousal.   RECOMMENDATIONS:  1. I would consider a home autotitration unit to see if the patient      benefits from positive pressure.  2. Treatment of limb movements with dopamine agonist such as Requip or      Mirapex.   Thanks for this referral.      Kofi A. Gerilyn Pilgrim, M.D.  Electronically Signed     KAD/MEDQ  D:  11/17/2008  T:  11/17/2008  Job:  [id number]

## 2011-05-07 ENCOUNTER — Telehealth (INDEPENDENT_AMBULATORY_CARE_PROVIDER_SITE_OTHER): Payer: Medicare Other | Admitting: *Deleted

## 2011-05-07 DIAGNOSIS — B354 Tinea corporis: Secondary | ICD-10-CM

## 2011-05-07 NOTE — Telephone Encounter (Signed)
States he was seen here & given topical for ringworm. It has not gone away. States his pharmacist told him to ask for oral med to treat this. Sent to Dr. Orvan Falconer as his usual md is on vacation

## 2011-05-07 NOTE — Telephone Encounter (Signed)
Checked with other mds here. Pt will need to wait for his md. He will be back Monday. Pt notified

## 2011-05-13 MED ORDER — FLUCONAZOLE 200 MG PO TABS: 200.0000 mg | ORAL_TABLET | Freq: Every day | ORAL | Status: AC

## 2011-05-26 ENCOUNTER — Telehealth: Payer: Self-pay | Admitting: *Deleted

## 2011-05-26 NOTE — Telephone Encounter (Signed)
States he was seen in April for a small fungal area. Has tried multiple otcs. States the area is much bigger now & his pharmacist told him he needed an oral antifungal. Transferred to front to make an appt

## 2011-05-27 ENCOUNTER — Ambulatory Visit: Payer: Medicare Other

## 2011-05-27 ENCOUNTER — Ambulatory Visit (INDEPENDENT_AMBULATORY_CARE_PROVIDER_SITE_OTHER): Payer: Medicare Other | Admitting: Adult Health

## 2011-05-27 VITALS — Temp 98.1°F | Ht 71.0 in | Wt 235.0 lb

## 2011-05-27 DIAGNOSIS — B354 Tinea corporis: Secondary | ICD-10-CM | POA: Insufficient documentation

## 2011-05-27 MED ORDER — FLUCONAZOLE 100 MG PO TABS: 100.0000 mg | ORAL_TABLET | Freq: Every day | ORAL | Status: AC

## 2011-05-27 NOTE — Patient Instructions (Signed)
Keep affected area, clean and dry. Take antifungal medication until gone. Apply thin layer mupirocin ointment on area. Daily. If no improvement in 10 days, contact clinic for reevaluation.

## 2011-05-27 NOTE — Progress Notes (Signed)
  Subjective:    Patient ID: Hunter Pratt, male    DOB: [birth year], 55 y.o.   MRN: [id number]  HPI Presents with a complaint of progressively worsening rash to the right lower leg. That's been present since April of 2012. States he has tried different creams and ointments as prescribed by his PCP, but it appears the wound. Continues to progressively worsen. Complaints of pruritus, especially at nighttime.   Review of Systems  Skin: Positive for rash.       Objective:   Physical Exam  Skin:       Well circumscribed erythematous papular rash approximately 6 cm in diameter to the medial aspect of the right lower leg superior to the malleolus. No weeping or drainage noted.          Assessment & Plan:  1. Rash consistent with tinea corporis or other dermatophyte infection. Will try fluconazole 100 mg 1 by mouth daily x10 days. He is to keep the area clean and dry and apply a thin layer of mupirocin ointment to the area. If symptoms do not improve within the next 10 days, a referral to dermatology is warranted.

## 2011-05-28 ENCOUNTER — Other Ambulatory Visit (INDEPENDENT_AMBULATORY_CARE_PROVIDER_SITE_OTHER): Payer: Medicare Other | Admitting: *Deleted

## 2011-05-28 DIAGNOSIS — Z21 Asymptomatic human immunodeficiency virus [HIV] infection status: Secondary | ICD-10-CM

## 2011-05-28 DIAGNOSIS — F32A Depression, unspecified: Secondary | ICD-10-CM

## 2011-05-28 DIAGNOSIS — F0630 Mood disorder due to known physiological condition, unspecified: Secondary | ICD-10-CM

## 2011-05-28 DIAGNOSIS — Z113 Encounter for screening for infections with a predominantly sexual mode of transmission: Secondary | ICD-10-CM

## 2011-05-28 DIAGNOSIS — K219 Gastro-esophageal reflux disease without esophagitis: Secondary | ICD-10-CM

## 2011-05-28 DIAGNOSIS — F17200 Nicotine dependence, unspecified, uncomplicated: Secondary | ICD-10-CM

## 2011-05-28 DIAGNOSIS — B20 Human immunodeficiency virus [HIV] disease: Secondary | ICD-10-CM

## 2011-05-28 DIAGNOSIS — Z139 Encounter for screening, unspecified: Secondary | ICD-10-CM

## 2011-05-28 DIAGNOSIS — R739 Hyperglycemia, unspecified: Secondary | ICD-10-CM

## 2011-05-28 DIAGNOSIS — F329 Major depressive disorder, single episode, unspecified: Secondary | ICD-10-CM

## 2011-05-28 MED ORDER — RALTEGRAVIR POTASSIUM 400 MG PO TABS: 400.0000 mg | ORAL_TABLET | Freq: Two times a day (BID) | ORAL | Status: DC

## 2011-05-28 MED ORDER — OMEPRAZOLE 20 MG PO CPDR: 40.0000 mg | DELAYED_RELEASE_CAPSULE | Freq: Every day | ORAL | Status: DC

## 2011-06-03 ENCOUNTER — Other Ambulatory Visit: Payer: Self-pay | Admitting: Licensed Clinical Social Worker

## 2011-06-03 DIAGNOSIS — R739 Hyperglycemia, unspecified: Secondary | ICD-10-CM

## 2011-06-03 DIAGNOSIS — Z139 Encounter for screening, unspecified: Secondary | ICD-10-CM

## 2011-06-03 DIAGNOSIS — K219 Gastro-esophageal reflux disease without esophagitis: Secondary | ICD-10-CM

## 2011-06-03 DIAGNOSIS — F32A Depression, unspecified: Secondary | ICD-10-CM

## 2011-06-03 DIAGNOSIS — F0630 Mood disorder due to known physiological condition, unspecified: Secondary | ICD-10-CM

## 2011-06-03 DIAGNOSIS — B20 Human immunodeficiency virus [HIV] disease: Secondary | ICD-10-CM

## 2011-06-03 DIAGNOSIS — F329 Major depressive disorder, single episode, unspecified: Secondary | ICD-10-CM

## 2011-06-03 DIAGNOSIS — F17200 Nicotine dependence, unspecified, uncomplicated: Secondary | ICD-10-CM

## 2011-06-03 DIAGNOSIS — Z113 Encounter for screening for infections with a predominantly sexual mode of transmission: Secondary | ICD-10-CM

## 2011-06-03 MED ORDER — RALTEGRAVIR POTASSIUM 400 MG PO TABS: 400.0000 mg | ORAL_TABLET | Freq: Two times a day (BID) | ORAL | Status: DC

## 2011-06-10 ENCOUNTER — Other Ambulatory Visit: Payer: Self-pay | Admitting: *Deleted

## 2011-06-10 ENCOUNTER — Encounter: Payer: Self-pay | Admitting: Adult Health

## 2011-06-10 ENCOUNTER — Telehealth: Payer: Self-pay | Admitting: *Deleted

## 2011-06-10 ENCOUNTER — Ambulatory Visit (INDEPENDENT_AMBULATORY_CARE_PROVIDER_SITE_OTHER): Payer: Medicare Other | Admitting: Adult Health

## 2011-06-10 VITALS — Temp 97.4°F | Ht 71.0 in | Wt 236.4 lb

## 2011-06-10 DIAGNOSIS — R21 Rash and other nonspecific skin eruption: Secondary | ICD-10-CM

## 2011-06-10 DIAGNOSIS — Z113 Encounter for screening for infections with a predominantly sexual mode of transmission: Secondary | ICD-10-CM

## 2011-06-10 MED ORDER — DOXYCYCLINE HYCLATE 100 MG PO TABS: 100.0000 mg | ORAL_TABLET | Freq: Two times a day (BID) | ORAL | Status: DC

## 2011-06-10 MED ORDER — DOXYCYCLINE HYCLATE 100 MG PO TABS: 100.0000 mg | ORAL_TABLET | Freq: Two times a day (BID) | ORAL | Status: AC

## 2011-06-10 NOTE — Progress Notes (Signed)
  Subjective:    Patient ID: Hunter Pratt, male    DOB: Apr 08, 1956, 55 y.o.   MRN: [id number]  HPI Mr. Guereca presents to clinic today with continuing complaints of a rash that involves an area on the right lower leg. He was seen on 05/27/2011. By this provider for the same complaints and was treated presumptively for tinea corporis. However, symptoms have not improved, and the rash seems to "be worse." He denies any new contacts with skin irritants, detergents, insect bites, animals. Denies, fevers, chills, sweats.   Review of Systems Review of systems is unchanged.    Objective:   Physical Exam Rash to the right lower leg as outlined from the note on 05/27/2011 is worse in appearance. There is some slight desquamation. However, the size of the rash is no larger than it was on the previous. Assessment.       Assessment & Plan:  Rash Right Lower Leg. Uncertain cause of lesion is. However, we will obtain a CBC today and an RPR. A course of doxycycline should be prescribed, along with a referral to dermatology.

## 2011-06-10 NOTE — Telephone Encounter (Signed)
Pt notified to go to [facility]. He is aware where to md is located. Faxed info to them

## 2011-06-10 NOTE — Telephone Encounter (Signed)
I spoke with pt to make sure we had correct pharmacy. He will pick up the meds today. Still waiting for dermatologist to call back. They did not have anything for a new pt until Sept. The triage nurse will call us & may be able to get him seen sooner. Pt will return for labs next time he is in [place]. Lives 32 miles away & does not want to make 2 trips.

## 2011-06-11 ENCOUNTER — Other Ambulatory Visit (INDEPENDENT_AMBULATORY_CARE_PROVIDER_SITE_OTHER): Payer: Medicare Other

## 2011-06-11 DIAGNOSIS — Z139 Encounter for screening, unspecified: Secondary | ICD-10-CM

## 2011-06-11 DIAGNOSIS — K219 Gastro-esophageal reflux disease without esophagitis: Secondary | ICD-10-CM

## 2011-06-11 DIAGNOSIS — B20 Human immunodeficiency virus [HIV] disease: Secondary | ICD-10-CM

## 2011-06-11 DIAGNOSIS — F0630 Mood disorder due to known physiological condition, unspecified: Secondary | ICD-10-CM

## 2011-06-11 DIAGNOSIS — F329 Major depressive disorder, single episode, unspecified: Secondary | ICD-10-CM

## 2011-06-11 DIAGNOSIS — F32A Depression, unspecified: Secondary | ICD-10-CM

## 2011-06-11 DIAGNOSIS — Z202 Contact with and (suspected) exposure to infections with a predominantly sexual mode of transmission: Secondary | ICD-10-CM

## 2011-06-11 DIAGNOSIS — F17200 Nicotine dependence, unspecified, uncomplicated: Secondary | ICD-10-CM

## 2011-06-11 DIAGNOSIS — Z113 Encounter for screening for infections with a predominantly sexual mode of transmission: Secondary | ICD-10-CM

## 2011-06-11 DIAGNOSIS — R739 Hyperglycemia, unspecified: Secondary | ICD-10-CM

## 2011-06-11 LAB — COMPREHENSIVE METABOLIC PANEL
ALT: 21 U/L (ref 0–53)
AST: 21 U/L (ref 0–37)
Albumin: 4.1 g/dL (ref 3.5–5.2)
Alkaline Phosphatase: 109 U/L (ref 39–117)
BUN: 15 mg/dL (ref 6–23)
CO2: 23 mEq/L (ref 19–32)
Calcium: 9.3 mg/dL (ref 8.4–10.5)
Chloride: 103 mEq/L (ref 96–112)
Creat: 1.12 mg/dL (ref 0.50–1.35)
Glucose, Bld: 153 mg/dL — ABNORMAL HIGH (ref 70–99)
Potassium: 4.3 mEq/L (ref 3.5–5.3)
Sodium: 137 mEq/L (ref 135–145)
Total Bilirubin: 0.4 mg/dL (ref 0.3–1.2)
Total Protein: 7.2 g/dL (ref 6.0–8.3)

## 2011-06-11 LAB — CBC WITH DIFFERENTIAL/PLATELET
Basophils Absolute: 0.1 10*3/uL (ref 0.0–0.1)
Basophils Relative: 1 % (ref 0–1)
Eosinophils Absolute: 0.4 10*3/uL (ref 0.0–0.7)
Eosinophils Relative: 4 % (ref 0–5)
HCT: 53.3 % — ABNORMAL HIGH (ref 39.0–52.0)
Hemoglobin: 18.1 g/dL — ABNORMAL HIGH (ref 13.0–17.0)
Lymphocytes Relative: 30 % (ref 12–46)
Lymphs Abs: 2.9 10*3/uL (ref 0.7–4.0)
MCH: 32.8 pg (ref 26.0–34.0)
MCHC: 34 g/dL (ref 30.0–36.0)
MCV: 96.7 fL (ref 78.0–100.0)
Monocytes Absolute: 1 10*3/uL (ref 0.1–1.0)
Monocytes Relative: 11 % (ref 3–12)
Neutro Abs: 5.2 10*3/uL (ref 1.7–7.7)
Neutrophils Relative %: 55 % (ref 43–77)
Platelets: 255 10*3/uL (ref 150–400)
RBC: 5.51 MIL/uL (ref 4.22–5.81)
RDW: 13.7 % (ref 11.5–15.5)
WBC: 9.5 10*3/uL (ref 4.0–10.5)

## 2011-06-12 LAB — T-HELPER CELL (CD4) - (RCID CLINIC ONLY)
CD4 % Helper T Cell: 24 % — ABNORMAL LOW (ref 33–55)
CD4 T Cell Abs: 740 uL (ref 400–2700)

## 2011-06-12 LAB — HIV-1 RNA QUANT-NO REFLEX-BLD
HIV 1 RNA Quant: <20 copies/mL (ref <20)
HIV-1 RNA Quant, Log: <1.3 {Log} (ref <1.30)

## 2011-06-12 LAB — RPR

## 2011-06-12 LAB — GC/CHLAMYDIA PROBE AMP, URINE
Chlamydia, Swab/Urine, PCR: NEGATIVE
GC Probe Amp, Urine: NEGATIVE

## 2011-07-29 ENCOUNTER — Other Ambulatory Visit: Payer: Self-pay | Admitting: *Deleted

## 2011-07-29 DIAGNOSIS — Z139 Encounter for screening, unspecified: Secondary | ICD-10-CM

## 2011-07-29 DIAGNOSIS — Z113 Encounter for screening for infections with a predominantly sexual mode of transmission: Secondary | ICD-10-CM

## 2011-07-29 DIAGNOSIS — B20 Human immunodeficiency virus [HIV] disease: Secondary | ICD-10-CM

## 2011-07-29 DIAGNOSIS — K219 Gastro-esophageal reflux disease without esophagitis: Secondary | ICD-10-CM

## 2011-07-29 DIAGNOSIS — R739 Hyperglycemia, unspecified: Secondary | ICD-10-CM

## 2011-07-29 DIAGNOSIS — F32A Depression, unspecified: Secondary | ICD-10-CM

## 2011-07-29 DIAGNOSIS — F329 Major depressive disorder, single episode, unspecified: Secondary | ICD-10-CM

## 2011-07-29 DIAGNOSIS — F17200 Nicotine dependence, unspecified, uncomplicated: Secondary | ICD-10-CM

## 2011-07-29 DIAGNOSIS — F0630 Mood disorder due to known physiological condition, unspecified: Secondary | ICD-10-CM

## 2011-07-29 MED ORDER — EMTRICITABINE-TENOFOVIR DF 200-300 MG PO TABS: 1.0000 | ORAL_TABLET | Freq: Every day | ORAL | Status: DC

## 2011-08-01 ENCOUNTER — Other Ambulatory Visit: Payer: Self-pay | Admitting: *Deleted

## 2011-08-01 DIAGNOSIS — F0630 Mood disorder due to known physiological condition, unspecified: Secondary | ICD-10-CM

## 2011-08-01 DIAGNOSIS — F32A Depression, unspecified: Secondary | ICD-10-CM

## 2011-08-01 DIAGNOSIS — F329 Major depressive disorder, single episode, unspecified: Secondary | ICD-10-CM

## 2011-08-01 DIAGNOSIS — B20 Human immunodeficiency virus [HIV] disease: Secondary | ICD-10-CM

## 2011-08-01 DIAGNOSIS — R739 Hyperglycemia, unspecified: Secondary | ICD-10-CM

## 2011-08-01 DIAGNOSIS — F17200 Nicotine dependence, unspecified, uncomplicated: Secondary | ICD-10-CM

## 2011-08-01 DIAGNOSIS — Z139 Encounter for screening, unspecified: Secondary | ICD-10-CM

## 2011-08-01 DIAGNOSIS — Z113 Encounter for screening for infections with a predominantly sexual mode of transmission: Secondary | ICD-10-CM

## 2011-08-01 DIAGNOSIS — K219 Gastro-esophageal reflux disease without esophagitis: Secondary | ICD-10-CM

## 2011-08-01 MED ORDER — EMTRICITABINE-TENOFOVIR DF 200-300 MG PO TABS: 1.0000 | ORAL_TABLET | Freq: Every day | ORAL | Status: DC

## 2011-08-05 ENCOUNTER — Other Ambulatory Visit: Payer: Self-pay | Admitting: *Deleted

## 2011-08-05 DIAGNOSIS — Z113 Encounter for screening for infections with a predominantly sexual mode of transmission: Secondary | ICD-10-CM

## 2011-08-05 DIAGNOSIS — F17200 Nicotine dependence, unspecified, uncomplicated: Secondary | ICD-10-CM

## 2011-08-05 DIAGNOSIS — K219 Gastro-esophageal reflux disease without esophagitis: Secondary | ICD-10-CM

## 2011-08-05 DIAGNOSIS — R739 Hyperglycemia, unspecified: Secondary | ICD-10-CM

## 2011-08-05 DIAGNOSIS — F329 Major depressive disorder, single episode, unspecified: Secondary | ICD-10-CM

## 2011-08-05 DIAGNOSIS — F32A Depression, unspecified: Secondary | ICD-10-CM

## 2011-08-05 DIAGNOSIS — B20 Human immunodeficiency virus [HIV] disease: Secondary | ICD-10-CM

## 2011-08-05 DIAGNOSIS — F0630 Mood disorder due to known physiological condition, unspecified: Secondary | ICD-10-CM

## 2011-08-05 DIAGNOSIS — Z139 Encounter for screening, unspecified: Secondary | ICD-10-CM

## 2011-08-05 MED ORDER — INTELENCE 200 MG PO TABS: 200.0000 mg | ORAL_TABLET | Freq: Two times a day (BID) | ORAL | Status: DC

## 2011-08-06 ENCOUNTER — Other Ambulatory Visit: Payer: Self-pay | Admitting: *Deleted

## 2011-08-06 DIAGNOSIS — Z113 Encounter for screening for infections with a predominantly sexual mode of transmission: Secondary | ICD-10-CM

## 2011-08-06 DIAGNOSIS — F17200 Nicotine dependence, unspecified, uncomplicated: Secondary | ICD-10-CM

## 2011-08-06 DIAGNOSIS — F329 Major depressive disorder, single episode, unspecified: Secondary | ICD-10-CM

## 2011-08-06 DIAGNOSIS — K219 Gastro-esophageal reflux disease without esophagitis: Secondary | ICD-10-CM

## 2011-08-06 DIAGNOSIS — B20 Human immunodeficiency virus [HIV] disease: Secondary | ICD-10-CM

## 2011-08-06 DIAGNOSIS — F32A Depression, unspecified: Secondary | ICD-10-CM

## 2011-08-06 DIAGNOSIS — R739 Hyperglycemia, unspecified: Secondary | ICD-10-CM

## 2011-08-06 DIAGNOSIS — F0630 Mood disorder due to known physiological condition, unspecified: Secondary | ICD-10-CM

## 2011-08-06 DIAGNOSIS — Z139 Encounter for screening, unspecified: Secondary | ICD-10-CM

## 2011-08-06 MED ORDER — INTELENCE 200 MG PO TABS: 200.0000 mg | ORAL_TABLET | Freq: Two times a day (BID) | ORAL | Status: DC

## 2011-08-21 LAB — URINE MICROSCOPIC-ADD ON

## 2011-08-21 LAB — URINALYSIS, ROUTINE W REFLEX MICROSCOPIC
Glucose, UA: NEGATIVE
Ketones, ur: NEGATIVE
Leukocytes, UA: NEGATIVE
Nitrite: NEGATIVE
Protein, ur: 30 — AB
Specific Gravity, Urine: >1.03 — ABNORMAL HIGH
Urobilinogen, UA: 0.2
pH: 5

## 2011-08-21 LAB — COMPREHENSIVE METABOLIC PANEL
ALT: 30
AST: 26
Albumin: 3.6
Alkaline Phosphatase: 123 — ABNORMAL HIGH
BUN: 16
CO2: 24
Calcium: 9.4
Chloride: 101
Creatinine, Ser: 1.06
GFR calc Af Amer: >60
GFR calc non Af Amer: >60
Glucose, Bld: 115 — ABNORMAL HIGH
Potassium: 3.6
Sodium: 134 — ABNORMAL LOW
Total Bilirubin: 0.7
Total Protein: 7.1

## 2011-08-21 LAB — DIFFERENTIAL
Basophils Absolute: 0.1
Basophils Relative: 1
Eosinophils Absolute: 0.2
Eosinophils Relative: 1
Lymphocytes Relative: 19
Lymphs Abs: 3.3
Monocytes Absolute: 1.5 — ABNORMAL HIGH
Monocytes Relative: 9
Neutro Abs: 12.1 — ABNORMAL HIGH
Neutrophils Relative %: 70

## 2011-08-21 LAB — CBC
HCT: 48
Hemoglobin: 16.5
MCHC: 34.5
MCV: 93.8
Platelets: 329
RBC: 5.12
RDW: 13.4
WBC: 17.2 — ABNORMAL HIGH

## 2011-08-21 LAB — LIPASE, BLOOD: Lipase: 29

## 2011-08-25 LAB — T-HELPER CELL (CD4) - (RCID CLINIC ONLY)
CD4 % Helper T Cell: 21 — ABNORMAL LOW
CD4 T Cell Abs: 730

## 2011-08-28 LAB — T-HELPER CELL (CD4) - (RCID CLINIC ONLY)
CD4 % Helper T Cell: 16 — ABNORMAL LOW
CD4 T Cell Abs: 520

## 2011-09-02 LAB — T-HELPER CELL (CD4) - (RCID CLINIC ONLY)
CD4 % Helper T Cell: 20 — ABNORMAL LOW
CD4 T Cell Abs: 510

## 2011-09-05 LAB — T-HELPER CELL (CD4) - (RCID CLINIC ONLY)
CD4 % Helper T Cell: 19 — ABNORMAL LOW
CD4 T Cell Abs: 600

## 2011-09-11 LAB — T-HELPER CELL (CD4) - (RCID CLINIC ONLY)
CD4 % Helper T Cell: 22 — ABNORMAL LOW
CD4 T Cell Abs: 670

## 2011-09-18 LAB — T-HELPER CELL (CD4) - (RCID CLINIC ONLY)
CD4 % Helper T Cell: 19 — ABNORMAL LOW
CD4 T Cell Abs: 890

## 2011-10-15 ENCOUNTER — Other Ambulatory Visit: Payer: Self-pay | Admitting: Internal Medicine

## 2011-10-15 DIAGNOSIS — E119 Type 2 diabetes mellitus without complications: Secondary | ICD-10-CM

## 2011-10-15 DIAGNOSIS — B20 Human immunodeficiency virus [HIV] disease: Secondary | ICD-10-CM

## 2011-10-16 ENCOUNTER — Other Ambulatory Visit: Payer: Self-pay | Admitting: Infectious Disease

## 2011-10-16 ENCOUNTER — Other Ambulatory Visit: Payer: Medicare Other

## 2011-10-16 ENCOUNTER — Other Ambulatory Visit: Payer: Self-pay | Admitting: Licensed Clinical Social Worker

## 2011-10-16 DIAGNOSIS — E119 Type 2 diabetes mellitus without complications: Secondary | ICD-10-CM

## 2011-10-16 DIAGNOSIS — M3390 Dermatopolymyositis, unspecified, organ involvement unspecified: Secondary | ICD-10-CM

## 2011-10-16 DIAGNOSIS — B20 Human immunodeficiency virus [HIV] disease: Secondary | ICD-10-CM

## 2011-10-16 LAB — CBC WITH DIFFERENTIAL/PLATELET
Basophils Absolute: 0.1 10*3/uL (ref 0.0–0.1)
Basophils Relative: 1 % (ref 0–1)
Eosinophils Absolute: 0.4 10*3/uL (ref 0.0–0.7)
Eosinophils Relative: 4 % (ref 0–5)
HCT: 55.7 % — ABNORMAL HIGH (ref 39.0–52.0)
Hemoglobin: 19.1 g/dL — ABNORMAL HIGH (ref 13.0–17.0)
Lymphocytes Relative: 29 % (ref 12–46)
Lymphs Abs: 3.1 10*3/uL (ref 0.7–4.0)
MCH: 33.3 pg (ref 26.0–34.0)
MCHC: 34.3 g/dL (ref 30.0–36.0)
MCV: 97.2 fL (ref 78.0–100.0)
Monocytes Absolute: 0.9 10*3/uL (ref 0.1–1.0)
Monocytes Relative: 8 % (ref 3–12)
Neutro Abs: 6.2 10*3/uL (ref 1.7–7.7)
Neutrophils Relative %: 58 % (ref 43–77)
Platelets: 284 10*3/uL (ref 150–400)
RBC: 5.73 MIL/uL (ref 4.22–5.81)
RDW: 14.5 % (ref 11.5–15.5)
WBC: 10.7 10*3/uL — ABNORMAL HIGH (ref 4.0–10.5)

## 2011-10-16 LAB — HEMOGLOBIN A1C
Hgb A1c MFr Bld: 5.7 % — ABNORMAL HIGH (ref <5.7)
Mean Plasma Glucose: 117 mg/dL — ABNORMAL HIGH (ref <117)

## 2011-10-17 LAB — T-HELPER CELL (CD4) - (RCID CLINIC ONLY)
CD4 % Helper T Cell: 24 % — ABNORMAL LOW (ref 33–55)
CD4 T Cell Abs: 740 uL (ref 400–2700)

## 2011-10-20 LAB — HIV-1 RNA QUANT-NO REFLEX-BLD
HIV 1 RNA Quant: <20 copies/mL (ref <20)
HIV-1 RNA Quant, Log: <1.3 {Log} (ref <1.30)

## 2011-10-21 LAB — COMPLETE METABOLIC PANEL WITH GFR
ALT: 20 U/L (ref 0–53)
AST: 24 U/L (ref 0–37)
Albumin: 4.3 g/dL (ref 3.5–5.2)
Alkaline Phosphatase: 133 U/L — ABNORMAL HIGH (ref 39–117)
BUN: 13 mg/dL (ref 6–23)
CO2: 20 mEq/L (ref 19–32)
Calcium: 10 mg/dL (ref 8.4–10.5)
Chloride: 103 mEq/L (ref 96–112)
Creat: 1.2 mg/dL (ref 0.50–1.35)
GFR, Est African American: 79 mL/min/{1.73_m2}
GFR, Est Non African American: 68 mL/min/{1.73_m2}
Glucose, Bld: 98 mg/dL (ref 70–99)
Potassium: 5 mEq/L (ref 3.5–5.3)
Sodium: 141 mEq/L (ref 135–145)
Total Bilirubin: 0.4 mg/dL (ref 0.3–1.2)
Total Protein: 7.7 g/dL (ref 6.0–8.3)

## 2011-10-30 ENCOUNTER — Ambulatory Visit (INDEPENDENT_AMBULATORY_CARE_PROVIDER_SITE_OTHER): Payer: Medicare Other | Admitting: Internal Medicine

## 2011-10-30 ENCOUNTER — Encounter: Payer: Self-pay | Admitting: Internal Medicine

## 2011-10-30 VITALS — BP 142/89 | HR 86 | Temp 97.6°F | Ht 70.0 in | Wt 236.4 lb

## 2011-10-30 DIAGNOSIS — Z79899 Other long term (current) drug therapy: Secondary | ICD-10-CM

## 2011-10-30 DIAGNOSIS — B20 Human immunodeficiency virus [HIV] disease: Secondary | ICD-10-CM

## 2011-10-30 DIAGNOSIS — Z23 Encounter for immunization: Secondary | ICD-10-CM

## 2011-10-30 DIAGNOSIS — Z0000 Encounter for general adult medical examination without abnormal findings: Secondary | ICD-10-CM

## 2011-10-30 NOTE — Progress Notes (Signed)
Addended by: Golden Circle A on: 10/30/2011 11:57 AM   Modules accepted: Orders

## 2011-10-30 NOTE — Progress Notes (Signed)
  Subjective:    Patient ID: Hunter Pratt, male    DOB: Sep 06, 1956, 55 y.o.   MRN: [id number]  HPIshe comes in for follow up of his HIV. He continues on a salvage regimen with Isentress, etravirine and Truvada he continues to have excellent adherence and compliance with the medication. He denies any missed doses and has had no recent hospitalizations, weight loss, diarrhea or other complaints. He did have a rash back in April that was believed to be ringworm and did improve with topical therapy however he had secondary allergic reactions to the antifungal cream determined by a dermatologist which he did see. He otherwise now has improved and from the standpoint    Review of Systems  Constitutional: Negative for fever, chills, fatigue and unexpected weight change.  HENT: Negative for sore throat and trouble swallowing.   Respiratory: Negative for cough and shortness of breath.   Cardiovascular: Negative for leg swelling.  Gastrointestinal: Negative for nausea, abdominal pain and diarrhea.  Genitourinary: Negative for flank pain and genital sores.  Musculoskeletal: Negative for myalgias and arthralgias.  Skin: Negative for rash.  Neurological: Negative for headaches.  Hematological: Negative for adenopathy.  Psychiatric/Behavioral: Negative for decreased concentration.       Objective:   Physical Exam  Constitutional: He is oriented to person, place, and time. He appears well-developed and well-nourished.  HENT:  Mouth/Throat: Oropharynx is clear and moist. No oropharyngeal exudate.  Cardiovascular: Normal rate, regular rhythm and normal heart sounds.  Exam reveals no gallop and no friction rub.   No murmur heard. Pulmonary/Chest: Effort normal and breath sounds normal. No respiratory distress. He has no wheezes.  Abdominal: Soft. Bowel sounds are normal. He exhibits no distension. There is no tenderness.  Lymphadenopathy:    He has no cervical adenopathy.  Neurological: He is alert and  oriented to person, place, and time.  Skin: Skin is warm and dry. No rash noted.          Assessment & Plan:

## 2011-10-30 NOTE — Patient Instructions (Signed)
Follow up with Dr. Daiva Eves in 4 months

## 2011-10-30 NOTE — Assessment & Plan Note (Signed)
He is doing well and I did congratulate him on his excellent adherence with his medications. Today he has no specific complaints and will return for followup in 4 months. He will get a fasting lipid panel that time and otherwise is up-to-date.he did get his flu shot today.

## 2011-12-18 ENCOUNTER — Ambulatory Visit: Payer: Medicare Other

## 2012-03-02 ENCOUNTER — Other Ambulatory Visit: Payer: Self-pay | Admitting: *Deleted

## 2012-03-02 DIAGNOSIS — Z113 Encounter for screening for infections with a predominantly sexual mode of transmission: Secondary | ICD-10-CM

## 2012-03-02 DIAGNOSIS — F17200 Nicotine dependence, unspecified, uncomplicated: Secondary | ICD-10-CM

## 2012-03-02 DIAGNOSIS — F0630 Mood disorder due to known physiological condition, unspecified: Secondary | ICD-10-CM

## 2012-03-02 DIAGNOSIS — F329 Major depressive disorder, single episode, unspecified: Secondary | ICD-10-CM

## 2012-03-02 DIAGNOSIS — R739 Hyperglycemia, unspecified: Secondary | ICD-10-CM

## 2012-03-02 DIAGNOSIS — B20 Human immunodeficiency virus [HIV] disease: Secondary | ICD-10-CM

## 2012-03-02 DIAGNOSIS — Z139 Encounter for screening, unspecified: Secondary | ICD-10-CM

## 2012-03-02 DIAGNOSIS — K219 Gastro-esophageal reflux disease without esophagitis: Secondary | ICD-10-CM

## 2012-03-02 DIAGNOSIS — F32A Depression, unspecified: Secondary | ICD-10-CM

## 2012-03-02 MED ORDER — RALTEGRAVIR POTASSIUM 400 MG PO TABS: 400.0000 mg | ORAL_TABLET | Freq: Two times a day (BID) | ORAL | Status: DC

## 2012-03-30 ENCOUNTER — Other Ambulatory Visit: Payer: Self-pay | Admitting: Licensed Clinical Social Worker

## 2012-03-30 DIAGNOSIS — F32A Depression, unspecified: Secondary | ICD-10-CM

## 2012-03-30 DIAGNOSIS — F329 Major depressive disorder, single episode, unspecified: Secondary | ICD-10-CM

## 2012-03-30 DIAGNOSIS — Z113 Encounter for screening for infections with a predominantly sexual mode of transmission: Secondary | ICD-10-CM

## 2012-03-30 DIAGNOSIS — Z139 Encounter for screening, unspecified: Secondary | ICD-10-CM

## 2012-03-30 DIAGNOSIS — R739 Hyperglycemia, unspecified: Secondary | ICD-10-CM

## 2012-03-30 DIAGNOSIS — K219 Gastro-esophageal reflux disease without esophagitis: Secondary | ICD-10-CM

## 2012-03-30 DIAGNOSIS — F0630 Mood disorder due to known physiological condition, unspecified: Secondary | ICD-10-CM

## 2012-03-30 DIAGNOSIS — B20 Human immunodeficiency virus [HIV] disease: Secondary | ICD-10-CM

## 2012-03-30 DIAGNOSIS — F17200 Nicotine dependence, unspecified, uncomplicated: Secondary | ICD-10-CM

## 2012-03-30 MED ORDER — RALTEGRAVIR POTASSIUM 400 MG PO TABS: 400.0000 mg | ORAL_TABLET | Freq: Two times a day (BID) | ORAL | Status: DC

## 2012-04-28 ENCOUNTER — Other Ambulatory Visit: Payer: Self-pay | Admitting: *Deleted

## 2012-04-28 DIAGNOSIS — F32A Depression, unspecified: Secondary | ICD-10-CM

## 2012-04-28 DIAGNOSIS — Z21 Asymptomatic human immunodeficiency virus [HIV] infection status: Secondary | ICD-10-CM

## 2012-04-28 DIAGNOSIS — F17200 Nicotine dependence, unspecified, uncomplicated: Secondary | ICD-10-CM

## 2012-04-28 DIAGNOSIS — Z113 Encounter for screening for infections with a predominantly sexual mode of transmission: Secondary | ICD-10-CM

## 2012-04-28 DIAGNOSIS — F329 Major depressive disorder, single episode, unspecified: Secondary | ICD-10-CM

## 2012-04-28 DIAGNOSIS — F0630 Mood disorder due to known physiological condition, unspecified: Secondary | ICD-10-CM

## 2012-04-28 DIAGNOSIS — Z139 Encounter for screening, unspecified: Secondary | ICD-10-CM

## 2012-04-28 DIAGNOSIS — R739 Hyperglycemia, unspecified: Secondary | ICD-10-CM

## 2012-04-28 DIAGNOSIS — K219 Gastro-esophageal reflux disease without esophagitis: Secondary | ICD-10-CM

## 2012-04-28 DIAGNOSIS — B20 Human immunodeficiency virus [HIV] disease: Secondary | ICD-10-CM

## 2012-04-28 MED ORDER — OMEPRAZOLE 20 MG PO CPDR: 40.0000 mg | DELAYED_RELEASE_CAPSULE | Freq: Every day | ORAL | Status: DC

## 2012-06-09 ENCOUNTER — Encounter: Payer: Self-pay | Admitting: Infectious Disease

## 2012-06-09 ENCOUNTER — Ambulatory Visit: Payer: Medicare Other

## 2012-06-09 ENCOUNTER — Ambulatory Visit (INDEPENDENT_AMBULATORY_CARE_PROVIDER_SITE_OTHER): Payer: Medicare Other | Admitting: Infectious Disease

## 2012-06-09 VITALS — BP 134/87 | HR 99 | Temp 97.4°F | Ht 70.0 in | Wt 244.0 lb

## 2012-06-09 DIAGNOSIS — G4730 Sleep apnea, unspecified: Secondary | ICD-10-CM

## 2012-06-09 DIAGNOSIS — B20 Human immunodeficiency virus [HIV] disease: Secondary | ICD-10-CM | POA: Diagnosis not present

## 2012-06-09 DIAGNOSIS — R609 Edema, unspecified: Secondary | ICD-10-CM

## 2012-06-09 DIAGNOSIS — G4733 Obstructive sleep apnea (adult) (pediatric): Secondary | ICD-10-CM | POA: Diagnosis not present

## 2012-06-09 DIAGNOSIS — Z23 Encounter for immunization: Secondary | ICD-10-CM

## 2012-06-09 DIAGNOSIS — R600 Localized edema: Secondary | ICD-10-CM

## 2012-06-09 LAB — CBC WITH DIFFERENTIAL/PLATELET
Basophils Absolute: 0.1 10*3/uL (ref 0.0–0.1)
Basophils Relative: 2 % — ABNORMAL HIGH (ref 0–1)
Eosinophils Absolute: 0.2 10*3/uL (ref 0.0–0.7)
Eosinophils Relative: 3 % (ref 0–5)
HCT: 51.7 % (ref 39.0–52.0)
Hemoglobin: 18.6 g/dL — ABNORMAL HIGH (ref 13.0–17.0)
Lymphocytes Relative: 28 % (ref 12–46)
Lymphs Abs: 2.2 10*3/uL (ref 0.7–4.0)
MCH: 32.6 pg (ref 26.0–34.0)
MCHC: 36 g/dL (ref 30.0–36.0)
MCV: 90.7 fL (ref 78.0–100.0)
Monocytes Absolute: 0.7 10*3/uL (ref 0.1–1.0)
Monocytes Relative: 8 % (ref 3–12)
Neutro Abs: 4.8 10*3/uL (ref 1.7–7.7)
Neutrophils Relative %: 59 % (ref 43–77)
Platelets: 280 10*3/uL (ref 150–400)
RBC: 5.7 MIL/uL (ref 4.22–5.81)
RDW: 13.4 % (ref 11.5–15.5)
WBC: 8 10*3/uL (ref 4.0–10.5)

## 2012-06-09 LAB — COMPLETE METABOLIC PANEL WITH GFR
ALT: 20 U/L (ref 0–53)
AST: 20 U/L (ref 0–37)
Albumin: 4.1 g/dL (ref 3.5–5.2)
Alkaline Phosphatase: 120 U/L — ABNORMAL HIGH (ref 39–117)
BUN: 8 mg/dL (ref 6–23)
CO2: 27 mEq/L (ref 19–32)
Calcium: 9.6 mg/dL (ref 8.4–10.5)
Chloride: 102 mEq/L (ref 96–112)
Creat: 1.07 mg/dL (ref 0.50–1.35)
GFR, Est African American: >89 mL/min
GFR, Est Non African American: 78 mL/min
Glucose, Bld: 134 mg/dL — ABNORMAL HIGH (ref 70–99)
Potassium: 4.4 mEq/L (ref 3.5–5.3)
Sodium: 139 mEq/L (ref 135–145)
Total Bilirubin: 0.4 mg/dL (ref 0.3–1.2)
Total Protein: 7.4 g/dL (ref 6.0–8.3)

## 2012-06-09 MED ORDER — FUROSEMIDE 20 MG PO TABS: 20.0000 mg | ORAL_TABLET | Freq: Every day | ORAL | Status: DC

## 2012-06-09 NOTE — Assessment & Plan Note (Signed)
Excellent control recheck labs today.

## 2012-06-09 NOTE — Assessment & Plan Note (Signed)
Likely due to right-sided heart failure possibly due to noncompliance with CPAP we'll give low-dose diuretic and possible potassium supplementation. We'll check a prob beta natruretic peptide and a 2-D echocardiogram.

## 2012-06-09 NOTE — Assessment & Plan Note (Signed)
May need repeat sleep study him back on CPAP. Currently he is unwilling to be adherent with this therapy. Would also like to get him plugged into a primary care physician.

## 2012-06-09 NOTE — Progress Notes (Signed)
Subjective:    Patient ID: Hunter Pratt, male    DOB: 1956-01-26, 56 y.o.   MRN: [id number]  HPI  Hunter Pratt is a 56 y.o. male who is doing superbly well on his  antiviral regimen, of inteence Isentress and Truvada with undetectable viral load and health cd4 count he presents today to clinic acutely with complaints in last 3 days of acute onset of lower extremity edema bilaterally. This edema has improved with elevation of his legs. He did claim to have had some erythema on his right distal lower extremity as well but this has subsided. On reviewing Hunter Pratt it turns out that he has not been compliant with his CPAP machine over the last half year 2 years time. I emphasized to him the need to comply with CPAP or he would have likely worsening right heart sided heart failure. Today we decided to go ahead and give him a diuretic with Lasix. He may need some potassium supplementation as well. With regards to sleep apnea he does continue to have some episodes of gasping for air at night and some daytime somnolence. He claims to be highly adherent to his antiretroviral regimen.   Review of Systems  Constitutional: Negative for fever, chills, diaphoresis, activity change, appetite change, fatigue and unexpected weight change.  HENT: Negative for congestion, sore throat, rhinorrhea, sneezing, trouble swallowing and sinus pressure.   Eyes: Negative for photophobia and visual disturbance.  Respiratory: Positive for shortness of breath. Negative for cough, chest tightness, wheezing and stridor.   Cardiovascular: Positive for leg swelling. Negative for chest pain and palpitations.  Gastrointestinal: Negative for nausea, vomiting, abdominal pain, diarrhea, constipation, blood in stool, abdominal distention and anal bleeding.  Genitourinary: Negative for dysuria, hematuria, flank pain and difficulty urinating.  Musculoskeletal: Negative for myalgias, back pain, joint swelling, arthralgias and gait problem.    Skin: Positive for color change. Negative for pallor, rash and wound.  Neurological: Negative for dizziness, tremors, weakness and light-headedness.  Hematological: Negative for adenopathy. Does not bruise/bleed easily.  Psychiatric/Behavioral: Negative for behavioral problems, confusion, disturbed wake/sleep cycle, dysphoric mood, decreased concentration and agitation.       Objective:   Physical Exam  Constitutional: He is oriented to person, place, and time. He appears well-developed and well-nourished. No distress.  HENT:  Head: Normocephalic and atraumatic.  Mouth/Throat: Oropharynx is clear and moist. No oropharyngeal exudate.  Eyes: Conjunctivae and EOM are normal. Pupils are equal, round, and reactive to light. No scleral icterus.  Neck: Normal range of motion. Neck supple. No JVD present.  Cardiovascular: Normal rate, regular rhythm and normal heart sounds.  Exam reveals no gallop and no friction rub.   No murmur heard. Pulmonary/Chest: Effort normal and breath sounds normal. No respiratory distress. He has no wheezes. He has no rales. He exhibits no tenderness.  Abdominal: He exhibits no distension and no mass. There is no tenderness. There is no rebound and no guarding.  Musculoskeletal: He exhibits edema and tenderness.  Lymphadenopathy:    He has no cervical adenopathy.  Neurological: He is alert and oriented to person, place, and time. He has normal reflexes. He exhibits normal muscle tone. Coordination normal.  Skin: Skin is warm and dry. He is not diaphoretic. No erythema. No pallor.  Psychiatric: He has a normal mood and affect. His behavior is normal. Judgment and thought content normal.          Assessment & Plan:  HIV DISEASE Excellent control recheck labs today.  Lower  extremity edema Likely due to right-sided heart failure possibly due to noncompliance with CPAP we'll give low-dose diuretic and possible potassium supplementation. We'll check a prob beta  natruretic peptide and a 2-D echocardiogram.  SLEEP APNEA May need repeat sleep study him back on CPAP. Currently he is unwilling to be adherent with this therapy. Would also like to get him plugged into a primary care physician.

## 2012-06-10 LAB — T-HELPER CELL (CD4) - (RCID CLINIC ONLY)
CD4 % Helper T Cell: 27 % — ABNORMAL LOW (ref 33–55)
CD4 T Cell Abs: 660 uL (ref 400–2700)

## 2012-06-11 LAB — HIV-1 RNA QUANT-NO REFLEX-BLD
HIV 1 RNA Quant: <20 copies/mL (ref <20)
HIV-1 RNA Quant, Log: <1.3 {Log} (ref <1.30)

## 2012-06-16 ENCOUNTER — Telehealth: Payer: Self-pay | Admitting: *Deleted

## 2012-06-16 LAB — PRO B NATRIURETIC PEPTIDE

## 2012-06-16 NOTE — Telephone Encounter (Signed)
Requesting phone call from Dr. Daiva Eves to discuss lab results from last week's OV.

## 2012-06-30 ENCOUNTER — Other Ambulatory Visit: Payer: Self-pay | Admitting: *Deleted

## 2012-06-30 DIAGNOSIS — R739 Hyperglycemia, unspecified: Secondary | ICD-10-CM

## 2012-06-30 DIAGNOSIS — K219 Gastro-esophageal reflux disease without esophagitis: Secondary | ICD-10-CM

## 2012-06-30 DIAGNOSIS — Z139 Encounter for screening, unspecified: Secondary | ICD-10-CM

## 2012-06-30 DIAGNOSIS — F32A Depression, unspecified: Secondary | ICD-10-CM

## 2012-06-30 DIAGNOSIS — F0630 Mood disorder due to known physiological condition, unspecified: Secondary | ICD-10-CM

## 2012-06-30 DIAGNOSIS — Z113 Encounter for screening for infections with a predominantly sexual mode of transmission: Secondary | ICD-10-CM

## 2012-06-30 DIAGNOSIS — F17200 Nicotine dependence, unspecified, uncomplicated: Secondary | ICD-10-CM

## 2012-06-30 DIAGNOSIS — F329 Major depressive disorder, single episode, unspecified: Secondary | ICD-10-CM

## 2012-06-30 DIAGNOSIS — B20 Human immunodeficiency virus [HIV] disease: Secondary | ICD-10-CM

## 2012-06-30 MED ORDER — INTELENCE 200 MG PO TABS: 200.0000 mg | ORAL_TABLET | Freq: Two times a day (BID) | ORAL | Status: DC

## 2012-06-30 MED ORDER — RALTEGRAVIR POTASSIUM 400 MG PO TABS: 400.0000 mg | ORAL_TABLET | Freq: Two times a day (BID) | ORAL | Status: DC

## 2012-06-30 MED ORDER — EMTRICITABINE-TENOFOVIR DF 200-300 MG PO TABS: 1.0000 | ORAL_TABLET | Freq: Every day | ORAL | Status: DC

## 2012-07-26 ENCOUNTER — Encounter: Payer: Self-pay | Admitting: Family Medicine

## 2012-07-26 ENCOUNTER — Ambulatory Visit (INDEPENDENT_AMBULATORY_CARE_PROVIDER_SITE_OTHER): Payer: Medicare Other | Admitting: Family Medicine

## 2012-07-26 VITALS — BP 142/76 | HR 100 | Resp 18 | Ht 71.0 in | Wt 242.1 lb

## 2012-07-26 DIAGNOSIS — G4733 Obstructive sleep apnea (adult) (pediatric): Secondary | ICD-10-CM

## 2012-07-26 DIAGNOSIS — F17200 Nicotine dependence, unspecified, uncomplicated: Secondary | ICD-10-CM

## 2012-07-26 DIAGNOSIS — R600 Localized edema: Secondary | ICD-10-CM

## 2012-07-26 DIAGNOSIS — R7309 Other abnormal glucose: Secondary | ICD-10-CM | POA: Diagnosis not present

## 2012-07-26 DIAGNOSIS — R7302 Impaired glucose tolerance (oral): Secondary | ICD-10-CM

## 2012-07-26 DIAGNOSIS — R739 Hyperglycemia, unspecified: Secondary | ICD-10-CM

## 2012-07-26 DIAGNOSIS — B20 Human immunodeficiency virus [HIV] disease: Secondary | ICD-10-CM

## 2012-07-26 DIAGNOSIS — K279 Peptic ulcer, site unspecified, unspecified as acute or chronic, without hemorrhage or perforation: Secondary | ICD-10-CM | POA: Diagnosis not present

## 2012-07-26 DIAGNOSIS — E785 Hyperlipidemia, unspecified: Secondary | ICD-10-CM

## 2012-07-26 DIAGNOSIS — D229 Melanocytic nevi, unspecified: Secondary | ICD-10-CM

## 2012-07-26 DIAGNOSIS — R609 Edema, unspecified: Secondary | ICD-10-CM

## 2012-07-26 DIAGNOSIS — I739 Peripheral vascular disease, unspecified: Secondary | ICD-10-CM | POA: Diagnosis not present

## 2012-07-26 DIAGNOSIS — D239 Other benign neoplasm of skin, unspecified: Secondary | ICD-10-CM

## 2012-07-26 LAB — PRO B NATRIURETIC PEPTIDE: Pro B Natriuretic peptide (BNP): 22.81 pg/mL (ref <126)

## 2012-07-26 LAB — HEMOGLOBIN A1C
Hgb A1c MFr Bld: 6.2 % — ABNORMAL HIGH (ref <5.7)
Mean Plasma Glucose: 131 mg/dL — ABNORMAL HIGH (ref <117)

## 2012-07-26 NOTE — Progress Notes (Signed)
  Subjective:    Patient ID: Hunter Pratt, male    DOB: Apr 27, 1956, 56 y.o.   MRN: [id number]  HPI  Pt here to establish care. Previous PCP [place] and he is also followed by the infectious disease clinic in [facility] Medications and history reviewed Patient has a history of HIV diagnosed in 1998. He is currently well-controlled with triple drug therapy. His counts are undetectable. He has a history of obstructive sleep apnea however does not use his CPAP he actually no longer has the machine at home He is concerned about a bluish mole that has shown up beneath his right axilla over the past few months. He has no history of skin cancer. His labs were reviewed there was some concern for leg edema and possible right-sided heart failure. A BNP was unable to be obtained and he is due for echocardiogram to have this evaluated   Review of Systems  GEN- denies fatigue, fever, weight loss,weakness, recent illness HEENT- denies eye drainage, change in vision, nasal discharge, CVS- denies chest pain, palpitations RESP- denies SOB, cough, wheeze ABD- denies N/V, change in stools, abd pain GU- denies dysuria, hematuria, dribbling, incontinence MSK- denies joint pain, muscle aches, injury Neuro- denies headache, dizziness, syncope, seizure activity      Objective:   Physical Exam GEN- NAD, alert and oriented x3 HEENT- PERRL, EOMI, non injected sclera, pink conjunctiva, MMM, oropharynx clear Neck- Supple,  CVS- RRR, no murmur RESP-CTAB ABD-NABS,soft,NT,ND EXT- No edema Pulses- Radial, DP- 2+ Skin- small skin toned mole with bluish hue in center, regular borders, beneath right axilla        Assessment & Plan:

## 2012-07-26 NOTE — Patient Instructions (Signed)
I will refer you to the dermatologist for the mole Echo to be done for the heart  Use the water pill as needed  Labs to be done today  F/U 4 months

## 2012-07-27 ENCOUNTER — Encounter: Payer: Self-pay | Admitting: Family Medicine

## 2012-07-30 ENCOUNTER — Encounter: Payer: Self-pay | Admitting: Family Medicine

## 2012-07-30 DIAGNOSIS — E119 Type 2 diabetes mellitus without complications: Secondary | ICD-10-CM | POA: Insufficient documentation

## 2012-07-30 DIAGNOSIS — D229 Melanocytic nevi, unspecified: Secondary | ICD-10-CM | POA: Insufficient documentation

## 2012-07-30 NOTE — Assessment & Plan Note (Signed)
Followed by ID, doing well, triple drug therapy

## 2012-07-30 NOTE — Assessment & Plan Note (Signed)
Check A1C 

## 2012-07-30 NOTE — Assessment & Plan Note (Signed)
History of peptic ulcer disease and GI hemorrhage. Continue PPI

## 2012-07-30 NOTE — Assessment & Plan Note (Signed)
He is no longer using CPAP machine. Will monitor off of this. If he has difficulty with his blood pressure, fatigue,  or his weight continues to increase we'll need to restart

## 2012-07-30 NOTE — Assessment & Plan Note (Signed)
Refer to dermatology 

## 2012-07-30 NOTE — Assessment & Plan Note (Signed)
FLP within normal limits 1 year ago

## 2012-07-30 NOTE — Assessment & Plan Note (Signed)
Discussed importance of smoking cessation. 

## 2012-07-30 NOTE — Assessment & Plan Note (Signed)
Currently not using lasix, Pro BNP will be obtained, 2 D Echo, likley dependent edema

## 2012-08-06 ENCOUNTER — Telehealth: Payer: Self-pay | Admitting: Family Medicine

## 2012-08-10 ENCOUNTER — Ambulatory Visit (HOSPITAL_COMMUNITY)
Admission: RE | Admit: 2012-08-10 | Discharge: 2012-08-10 | Disposition: A | Discharge status: Home / Self Care | Payer: Medicare Other | Source: Ambulatory Visit | Attending: Family Medicine | Admitting: Family Medicine

## 2012-08-10 DIAGNOSIS — D235 Other benign neoplasm of skin of trunk: Secondary | ICD-10-CM | POA: Diagnosis not present

## 2012-08-10 DIAGNOSIS — B20 Human immunodeficiency virus [HIV] disease: Secondary | ICD-10-CM | POA: Diagnosis not present

## 2012-08-10 DIAGNOSIS — R609 Edema, unspecified: Secondary | ICD-10-CM | POA: Insufficient documentation

## 2012-08-10 DIAGNOSIS — F17200 Nicotine dependence, unspecified, uncomplicated: Secondary | ICD-10-CM | POA: Diagnosis not present

## 2012-08-10 DIAGNOSIS — L723 Sebaceous cyst: Secondary | ICD-10-CM | POA: Diagnosis not present

## 2012-08-10 DIAGNOSIS — E785 Hyperlipidemia, unspecified: Secondary | ICD-10-CM | POA: Diagnosis not present

## 2012-08-10 DIAGNOSIS — R600 Localized edema: Secondary | ICD-10-CM

## 2012-08-10 DIAGNOSIS — G4733 Obstructive sleep apnea (adult) (pediatric): Secondary | ICD-10-CM

## 2012-08-10 DIAGNOSIS — L259 Unspecified contact dermatitis, unspecified cause: Secondary | ICD-10-CM | POA: Diagnosis not present

## 2012-08-10 NOTE — Progress Notes (Signed)
*  PRELIMINARY RESULTS* Echocardiogram 2D Echocardiogram has been performed.  [name] M 08/10/2012, 2:35 PM

## 2012-08-17 ENCOUNTER — Ambulatory Visit (INDEPENDENT_AMBULATORY_CARE_PROVIDER_SITE_OTHER): Payer: Medicare Other | Admitting: Family Medicine

## 2012-08-17 ENCOUNTER — Encounter: Payer: Self-pay | Admitting: Family Medicine

## 2012-08-17 VITALS — BP 120/84 | HR 88 | Resp 17 | Ht 71.0 in | Wt 242.1 lb

## 2012-08-17 DIAGNOSIS — R220 Localized swelling, mass and lump, head: Secondary | ICD-10-CM | POA: Diagnosis not present

## 2012-08-17 DIAGNOSIS — R221 Localized swelling, mass and lump, neck: Secondary | ICD-10-CM

## 2012-08-17 DIAGNOSIS — I519 Heart disease, unspecified: Secondary | ICD-10-CM

## 2012-08-17 MED ORDER — AMOXICILLIN-POT CLAVULANATE 875-125 MG PO TABS: 1.0000 | ORAL_TABLET | Freq: Two times a day (BID) | ORAL | Status: AC

## 2012-08-17 NOTE — Progress Notes (Signed)
  Subjective:    Patient ID: Hunter Pratt, male    DOB: 1955/12/31, 56 y.o.   MRN: [id number]  HPI Patient presents with right-sided facial swelling which started yesterday. He's had history of infected lymph node in the past. He denies any fever, sinus drainage, sore throat, difficulty swallowing, shortness of breath. He has not tried any medications. Echocardiogram was also reviewed   Review of Systems - per above   GEN- denies fatigue, fever, weight loss,weakness, recent illness HEENT- denies eye drainage, change in vision, nasal discharge, CVS- denies chest pain, palpitations RESP- denies SOB, cough, wheeze ABD- denies N/V, change in stools, abd pain Neuro- denies headache, dizziness, syncope, seizure activity      Objective:   Physical Exam GEN- NAD, alert and oriented x3 HEENT- PERRL, EOMI, non injected sclera, pink conjunctiva, MMM, oropharynx clear, small erosion on right lower gum line ,no abscess, TM clear bilat, no effusion, small node on right jawline anterior auricular , mild TTP, no fluctant, mild facial swelling on right side  Neck- Supple,  CVS- RRR, no murmur RESP-CTAB Ext- no edema Skin- eczematous rash on legs, dry cracking soles         Assessment & Plan:

## 2012-08-17 NOTE — Patient Instructions (Signed)
Call back about the cream for your leg Take the antibiotics Use warm compress Keep previous f/u appt

## 2012-08-18 DIAGNOSIS — I519 Heart disease, unspecified: Secondary | ICD-10-CM | POA: Insufficient documentation

## 2012-08-18 DIAGNOSIS — R220 Localized swelling, mass and lump, head: Secondary | ICD-10-CM | POA: Insufficient documentation

## 2012-08-18 NOTE — Assessment & Plan Note (Signed)
Tender note felt, will treat for infection, no discrete abscess, no trauma to face, if no improvement imaging next step

## 2012-08-18 NOTE — Assessment & Plan Note (Signed)
No further workup needed at this time 

## 2012-08-30 ENCOUNTER — Other Ambulatory Visit: Payer: Medicare Other

## 2012-08-30 DIAGNOSIS — Z79899 Other long term (current) drug therapy: Secondary | ICD-10-CM | POA: Diagnosis not present

## 2012-08-30 DIAGNOSIS — B20 Human immunodeficiency virus [HIV] disease: Secondary | ICD-10-CM | POA: Diagnosis not present

## 2012-08-30 LAB — CBC WITH DIFFERENTIAL/PLATELET
Basophils Absolute: 0.1 10*3/uL (ref 0.0–0.1)
Basophils Relative: 1 % (ref 0–1)
Eosinophils Absolute: 0.3 10*3/uL (ref 0.0–0.7)
Eosinophils Relative: 3 % (ref 0–5)
HCT: 49.9 % (ref 39.0–52.0)
Hemoglobin: 18 g/dL — ABNORMAL HIGH (ref 13.0–17.0)
Lymphocytes Relative: 29 % (ref 12–46)
Lymphs Abs: 2.6 10*3/uL (ref 0.7–4.0)
MCH: 32.6 pg (ref 26.0–34.0)
MCHC: 36.1 g/dL — ABNORMAL HIGH (ref 30.0–36.0)
MCV: 90.4 fL (ref 78.0–100.0)
Monocytes Absolute: 0.9 10*3/uL (ref 0.1–1.0)
Monocytes Relative: 10 % (ref 3–12)
Neutro Abs: 5.1 10*3/uL (ref 1.7–7.7)
Neutrophils Relative %: 57 % (ref 43–77)
Platelets: 297 10*3/uL (ref 150–400)
RBC: 5.52 MIL/uL (ref 4.22–5.81)
RDW: 13.5 % (ref 11.5–15.5)
WBC: 9 10*3/uL (ref 4.0–10.5)

## 2012-08-30 LAB — COMPLETE METABOLIC PANEL WITH GFR
ALT: 18 U/L (ref 0–53)
AST: 17 U/L (ref 0–37)
Albumin: 4.2 g/dL (ref 3.5–5.2)
Alkaline Phosphatase: 112 U/L (ref 39–117)
BUN: 10 mg/dL (ref 6–23)
CO2: 26 mEq/L (ref 19–32)
Calcium: 9.6 mg/dL (ref 8.4–10.5)
Chloride: 103 mEq/L (ref 96–112)
Creat: 1.02 mg/dL (ref 0.50–1.35)
GFR, Est African American: >89 mL/min
GFR, Est Non African American: 83 mL/min
Glucose, Bld: 154 mg/dL — ABNORMAL HIGH (ref 70–99)
Potassium: 4.3 mEq/L (ref 3.5–5.3)
Sodium: 137 mEq/L (ref 135–145)
Total Bilirubin: 0.4 mg/dL (ref 0.3–1.2)
Total Protein: 7.5 g/dL (ref 6.0–8.3)

## 2012-08-30 LAB — LIPID PANEL
Cholesterol: 190 mg/dL (ref 0–200)
HDL: 32 mg/dL — ABNORMAL LOW (ref >39)
LDL Cholesterol: 116 mg/dL — ABNORMAL HIGH (ref 0–99)
Total CHOL/HDL Ratio: 5.9 Ratio
Triglycerides: 212 mg/dL — ABNORMAL HIGH (ref <150)
VLDL: 42 mg/dL — ABNORMAL HIGH (ref 0–40)

## 2012-08-31 LAB — T-HELPER CELL (CD4) - (RCID CLINIC ONLY)
CD4 % Helper T Cell: 26 % — ABNORMAL LOW (ref 33–55)
CD4 T Cell Abs: 740 uL (ref 400–2700)

## 2012-08-31 LAB — HIV-1 RNA QUANT-NO REFLEX-BLD
HIV 1 RNA Quant: <20 copies/mL (ref <20)
HIV-1 RNA Quant, Log: <1.3 {Log} (ref <1.30)

## 2012-09-13 ENCOUNTER — Ambulatory Visit (INDEPENDENT_AMBULATORY_CARE_PROVIDER_SITE_OTHER): Payer: Medicare Other | Admitting: Infectious Disease

## 2012-09-13 ENCOUNTER — Encounter: Payer: Self-pay | Admitting: Infectious Disease

## 2012-09-13 VITALS — BP 154/109 | HR 92 | Temp 98.0°F | Ht 71.0 in | Wt 244.0 lb

## 2012-09-13 DIAGNOSIS — B20 Human immunodeficiency virus [HIV] disease: Secondary | ICD-10-CM | POA: Diagnosis not present

## 2012-09-13 DIAGNOSIS — I519 Heart disease, unspecified: Secondary | ICD-10-CM | POA: Diagnosis not present

## 2012-09-13 DIAGNOSIS — G4730 Sleep apnea, unspecified: Secondary | ICD-10-CM

## 2012-09-13 DIAGNOSIS — Z113 Encounter for screening for infections with a predominantly sexual mode of transmission: Secondary | ICD-10-CM

## 2012-09-13 DIAGNOSIS — Z23 Encounter for immunization: Secondary | ICD-10-CM | POA: Diagnosis not present

## 2012-09-13 DIAGNOSIS — E7800 Pure hypercholesterolemia, unspecified: Secondary | ICD-10-CM

## 2012-09-13 DIAGNOSIS — F17200 Nicotine dependence, unspecified, uncomplicated: Secondary | ICD-10-CM

## 2012-09-13 DIAGNOSIS — L049 Acute lymphadenitis, unspecified: Secondary | ICD-10-CM

## 2012-09-13 MED ORDER — PRAVASTATIN SODIUM 20 MG PO TABS: 20.0000 mg | ORAL_TABLET | Freq: Every day | ORAL | Status: DC

## 2012-09-13 NOTE — Assessment & Plan Note (Signed)
Had flare of lymph node in right neck better with abx. Will follow if worse get CT neck if worsens

## 2012-09-13 NOTE — Assessment & Plan Note (Signed)
Perfect control, continue current regimen

## 2012-09-13 NOTE — Progress Notes (Signed)
Subjective:    Patient ID: Hunter Pratt, male    DOB: [birth year], 56 y.o.   MRN: [id number]  HPI  Hunter Pratt is a 56 y.o. male who is doing superbly well on his antiviral regimen, of inteence Isentress and Truvada with undetectable viral load and health cd4 count. He STILL is NOT using CPAP machine. His 2 d echo showed mild CHF with reduced ef to 40%. He is being followed by Dr. Jeanice Lim for Primary care. He denies taking lasix at present and not on anti-HTNsive meds. He did suffer from swelling in face and lymph node enlargement in cervical neck pre-auricular that improved on augmentin. Otherwise he has no specific complaints today.    Review of Systems  Constitutional: Negative for fever, chills, diaphoresis, activity change, appetite change, fatigue and unexpected weight change.  HENT: Positive for facial swelling. Negative for congestion, sore throat, rhinorrhea, sneezing, trouble swallowing and sinus pressure.   Eyes: Negative for photophobia and visual disturbance.  Respiratory: Negative for cough, chest tightness, shortness of breath, wheezing and stridor.   Cardiovascular: Negative for chest pain, palpitations and leg swelling.  Gastrointestinal: Negative for nausea, vomiting, abdominal pain, diarrhea, constipation, blood in stool, abdominal distention and anal bleeding.  Genitourinary: Negative for dysuria, hematuria, flank pain and difficulty urinating.  Musculoskeletal: Negative for myalgias, back pain, joint swelling, arthralgias and gait problem.  Skin: Negative for color change, pallor, rash and wound.  Neurological: Negative for dizziness, tremors, weakness and light-headedness.  Hematological: Negative for adenopathy. Does not bruise/bleed easily.  Psychiatric/Behavioral: Negative for behavioral problems, confusion, disturbed wake/sleep cycle, dysphoric mood, decreased concentration and agitation.       Objective:   Physical Exam  Constitutional: He is oriented to person,  place, and time. He appears well-developed and well-nourished. No distress.  HENT:  Head: Normocephalic and atraumatic.  Mouth/Throat: Oropharynx is clear and moist. No oropharyngeal exudate.  Eyes: Conjunctivae normal and EOM are normal. Pupils are equal, round, and reactive to light. No scleral icterus.  Neck: Normal range of motion. Neck supple. No JVD present.  Cardiovascular: Normal rate, regular rhythm and normal heart sounds.  Exam reveals no gallop and no friction rub.   No murmur heard. Pulmonary/Chest: Effort normal and breath sounds normal. No respiratory distress. He has no wheezes. He has no rales. He exhibits no tenderness.  Abdominal: He exhibits no distension and no mass. There is no tenderness. There is no rebound and no guarding.  Musculoskeletal: He exhibits no edema and no tenderness.  Lymphadenopathy:       Head (right side): Preauricular adenopathy present.    He has no cervical adenopathy.  Neurological: He is alert and oriented to person, place, and time. He has normal reflexes. He exhibits normal muscle tone. Coordination normal.  Skin: Skin is warm and dry. He is not diaphoretic. No erythema. No pallor.  Psychiatric: He has a normal mood and affect. His behavior is normal. Judgment and thought content normal.          Assessment & Plan:  HIV DISEASE Perfect control, continue current regimen  TOBACCO USER Claims to have quit though I thought he smelled of tobacco  Systolic dysfunction Will defer to PCP but believe he should be on meds for his HTN and use his CPAP  SLEEP APNEA Needs to use CPAP  Elevated LDL cholesterol level Given his imparied GTTT, a1c up, I will put him on statin to get his LDL below 100  CERVICAL LYMPHADENITIS Had flare of lymph  node in right neck better with abx. Will follow if worse get CT neck if worsens

## 2012-09-13 NOTE — Assessment & Plan Note (Signed)
Needs to use CPAP 

## 2012-09-13 NOTE — Assessment & Plan Note (Signed)
Given his imparied GTTT, a1c up, I will put him on statin to get his LDL below 100

## 2012-09-13 NOTE — Assessment & Plan Note (Signed)
Will defer to PCP but believe he should be on meds for his HTN and use his CPAP

## 2012-09-13 NOTE — Assessment & Plan Note (Signed)
Claims to have quit though I thought he smelled of tobacco

## 2012-11-11 ENCOUNTER — Encounter: Payer: Self-pay | Admitting: Family Medicine

## 2012-11-11 ENCOUNTER — Ambulatory Visit (INDEPENDENT_AMBULATORY_CARE_PROVIDER_SITE_OTHER): Payer: Medicare Other | Admitting: Family Medicine

## 2012-11-11 VITALS — BP 124/82 | HR 88 | Resp 16 | Ht 71.0 in | Wt 246.0 lb

## 2012-11-11 DIAGNOSIS — E669 Obesity, unspecified: Secondary | ICD-10-CM

## 2012-11-11 DIAGNOSIS — E785 Hyperlipidemia, unspecified: Secondary | ICD-10-CM

## 2012-11-11 DIAGNOSIS — R7309 Other abnormal glucose: Secondary | ICD-10-CM | POA: Diagnosis not present

## 2012-11-11 DIAGNOSIS — L309 Dermatitis, unspecified: Secondary | ICD-10-CM

## 2012-11-11 DIAGNOSIS — R609 Edema, unspecified: Secondary | ICD-10-CM | POA: Diagnosis not present

## 2012-11-11 DIAGNOSIS — L259 Unspecified contact dermatitis, unspecified cause: Secondary | ICD-10-CM

## 2012-11-11 DIAGNOSIS — F17200 Nicotine dependence, unspecified, uncomplicated: Secondary | ICD-10-CM

## 2012-11-11 DIAGNOSIS — R7302 Impaired glucose tolerance (oral): Secondary | ICD-10-CM

## 2012-11-11 DIAGNOSIS — I519 Heart disease, unspecified: Secondary | ICD-10-CM

## 2012-11-11 DIAGNOSIS — R600 Localized edema: Secondary | ICD-10-CM

## 2012-11-11 MED ORDER — FLUOCINONIDE 0.05 % EX GEL: Freq: Two times a day (BID) | CUTANEOUS | Status: DC

## 2012-11-11 MED ORDER — METOPROLOL SUCCINATE ER 25 MG PO TB24: ORAL_TABLET | ORAL | Status: DC

## 2012-11-11 NOTE — Patient Instructions (Addendum)
Take the lasix daily  Continue cholesterol medications Cream has been reordered for your leg I will get records from the dermatologist  Low dose medication for blood pressure and heart protection Weight loss 15LBS F/U 3 months

## 2012-11-11 NOTE — Progress Notes (Signed)
  Subjective:    Patient ID: Hunter Pratt, male    DOB: 31-Jan-1956, 56 y.o.   MRN: [id number]  HPI PT here to f/u chronic medical problems, he has been doing well no specific concerns. He has had daily leg swelling and is using lasix now, in his ID doctors office his BP was noted to be extremely high. Denies CP, SOB He has gained some weight, is not watching his diet  Started on statin drug by ID physician  His eczematous rash is back on his legs, he is asking for steroid cream for this to be refilled, has seen dermatology in the past and had had biopsy  Review of Systems - per above     GEN- denies fatigue, fever, weight loss,weakness, recent illness HEENT- denies eye drainage, change in vision, nasal discharge, CVS- denies chest pain, palpitations, + leg swelling RESP- denies SOB, cough, wheeze ABD- denies N/V, change in stools, abd pain GU- denies dysuria, hematuria, dribbling, incontinence MSK- denies joint pain, muscle aches, injury Neuro- denies headache, dizziness, syncope, seizure activity     Objective:   Physical Exam  GEN- NAD, alert and oriented x3 HEENT- PERRL, EOMI, non injected sclera, pink conjunctiva, MMM, oropharynx clear, oropharynx clear, Neck- Supple no LAD CVS- RRR, no murmur RESP-CTAB Ext- trace pedal  edema Skin- eczematous rash on legs, dry cracking soles , flesh colored nevi on back with hair in it       Assessment & Plan:

## 2012-11-14 ENCOUNTER — Encounter: Payer: Self-pay | Admitting: Family Medicine

## 2012-11-14 DIAGNOSIS — L309 Dermatitis, unspecified: Secondary | ICD-10-CM | POA: Insufficient documentation

## 2012-11-14 DIAGNOSIS — E663 Overweight: Secondary | ICD-10-CM | POA: Insufficient documentation

## 2012-11-14 NOTE — Assessment & Plan Note (Signed)
Continue to work on complete cessation 

## 2012-11-14 NOTE — Assessment & Plan Note (Signed)
Reviewed FLP this has deteriorated agree with statin therapy

## 2012-11-14 NOTE — Assessment & Plan Note (Signed)
Glucose intolerance note, his A1C 6.2% discussed proper nutrition, weight loss goal set, will recheck at next visit

## 2012-11-14 NOTE — Assessment & Plan Note (Signed)
Discussed importance of weight loss. Short term goal set

## 2012-11-14 NOTE — Assessment & Plan Note (Signed)
Continue lasix  °

## 2012-11-14 NOTE — Assessment & Plan Note (Signed)
Mild dysfunction however now with daily leg swelling and use of lasix, will add low dose beta blocker, this will also help his BP though in our office no elevated blood pressures have been recorded

## 2012-11-14 NOTE — Assessment & Plan Note (Signed)
Obtain records, lidex refilled

## 2012-12-13 ENCOUNTER — Other Ambulatory Visit: Payer: Self-pay | Admitting: *Deleted

## 2012-12-13 DIAGNOSIS — Z113 Encounter for screening for infections with a predominantly sexual mode of transmission: Secondary | ICD-10-CM

## 2012-12-13 DIAGNOSIS — K219 Gastro-esophageal reflux disease without esophagitis: Secondary | ICD-10-CM

## 2012-12-13 DIAGNOSIS — F17200 Nicotine dependence, unspecified, uncomplicated: Secondary | ICD-10-CM

## 2012-12-13 DIAGNOSIS — R739 Hyperglycemia, unspecified: Secondary | ICD-10-CM

## 2012-12-13 DIAGNOSIS — B20 Human immunodeficiency virus [HIV] disease: Secondary | ICD-10-CM

## 2012-12-13 DIAGNOSIS — F329 Major depressive disorder, single episode, unspecified: Secondary | ICD-10-CM

## 2012-12-13 DIAGNOSIS — F0630 Mood disorder due to known physiological condition, unspecified: Secondary | ICD-10-CM

## 2012-12-13 DIAGNOSIS — Z139 Encounter for screening, unspecified: Secondary | ICD-10-CM

## 2012-12-13 DIAGNOSIS — F32A Depression, unspecified: Secondary | ICD-10-CM

## 2012-12-13 MED ORDER — EMTRICITABINE-TENOFOVIR DF 200-300 MG PO TABS: 1.0000 | ORAL_TABLET | Freq: Every day | ORAL | Status: DC

## 2012-12-13 MED ORDER — RALTEGRAVIR POTASSIUM 400 MG PO TABS: 400.0000 mg | ORAL_TABLET | Freq: Two times a day (BID) | ORAL | Status: DC

## 2012-12-13 MED ORDER — INTELENCE 200 MG PO TABS: 200.0000 mg | ORAL_TABLET | Freq: Two times a day (BID) | ORAL | Status: DC

## 2012-12-13 MED ORDER — OMEPRAZOLE 20 MG PO CPDR: 40.0000 mg | DELAYED_RELEASE_CAPSULE | Freq: Every day | ORAL | Status: DC

## 2013-02-01 ENCOUNTER — Ambulatory Visit: Payer: Medicare Other | Admitting: Internal Medicine

## 2013-02-18 ENCOUNTER — Ambulatory Visit: Payer: Medicare Other

## 2013-03-03 ENCOUNTER — Ambulatory Visit (INDEPENDENT_AMBULATORY_CARE_PROVIDER_SITE_OTHER): Payer: Medicare Other | Admitting: Family Medicine

## 2013-03-03 ENCOUNTER — Encounter: Payer: Self-pay | Admitting: Family Medicine

## 2013-03-03 VITALS — BP 112/82 | HR 94 | Resp 16 | Wt 244.1 lb

## 2013-03-03 DIAGNOSIS — R599 Enlarged lymph nodes, unspecified: Secondary | ICD-10-CM | POA: Diagnosis not present

## 2013-03-03 DIAGNOSIS — E785 Hyperlipidemia, unspecified: Secondary | ICD-10-CM

## 2013-03-03 DIAGNOSIS — L309 Dermatitis, unspecified: Secondary | ICD-10-CM

## 2013-03-03 DIAGNOSIS — L259 Unspecified contact dermatitis, unspecified cause: Secondary | ICD-10-CM

## 2013-03-03 DIAGNOSIS — B20 Human immunodeficiency virus [HIV] disease: Secondary | ICD-10-CM

## 2013-03-03 DIAGNOSIS — R7302 Impaired glucose tolerance (oral): Secondary | ICD-10-CM

## 2013-03-03 DIAGNOSIS — R7309 Other abnormal glucose: Secondary | ICD-10-CM

## 2013-03-03 DIAGNOSIS — I519 Heart disease, unspecified: Secondary | ICD-10-CM

## 2013-03-03 NOTE — Patient Instructions (Signed)
Scan CT scan of neck for lymph nodes Continue current medications Give them the lab slip for the labs  F/U 3 months

## 2013-03-04 ENCOUNTER — Encounter: Payer: Self-pay | Admitting: Family Medicine

## 2013-03-04 DIAGNOSIS — R591 Generalized enlarged lymph nodes: Secondary | ICD-10-CM | POA: Insufficient documentation

## 2013-03-04 NOTE — Assessment & Plan Note (Signed)
Improved with steroid cream

## 2013-03-04 NOTE — Assessment & Plan Note (Signed)
Recheck A1C 

## 2013-03-04 NOTE — Assessment & Plan Note (Signed)
Lipids to be rechecked on statin drug

## 2013-03-04 NOTE — Assessment & Plan Note (Signed)
He had a recent URI , but has had same node enlarged three times now total, one in past, 1 treated with antibiotics and now, Will get CT of neck as he also has HIV

## 2013-03-04 NOTE — Progress Notes (Signed)
  Subjective:    Patient ID: Hunter Pratt, male    DOB: 1956-11-11, 57 y.o.   MRN: [id number]  HPI  Patient presents to follow chronic medical problems. He states that he had an upper respiratory infection the past couple weeks where he a cough and some nasal drainage. He used over-the-counter medications and that is now much improved. He is due to have blood work for his infectious disease doctor. He had one day where he had increased leg swelling mostly on the right side but that resolved. He's been taking his Lasix everyday. Rash improved with the steroid cream  Review of Systems  GEN- denies fatigue, fever, weight loss,weakness, recent illness HEENT- denies eye drainage, change in vision, nasal discharge, CVS- denies chest pain, palpitations RESP- denies SOB, +cough, wheeze ABD- denies N/V, change in stools, abd pain GU- denies dysuria, hematuria, dribbling, incontinence MSK- denies joint pain, muscle aches, injury Neuro- denies headache, dizziness, syncope, seizure activity      Objective:   Physical Exam GEN- NAD, alert and oriented x3 HEENT- PERRL, EOMI, non injected sclera, pink conjunctiva, MMM, oropharynx clear, TM clear bilat Neck- Supple, + 1cm nodule felt Right anterior neck CVS- RRR, no murmur RESP-CTAB EXT- No edema Pulses- Radial, DP- 2+ Skin- mild scaley erythematous area on Right posterior leg       Assessment & Plan:

## 2013-03-04 NOTE — Assessment & Plan Note (Signed)
Well compensated 

## 2013-03-07 ENCOUNTER — Telehealth: Payer: Self-pay | Admitting: Family Medicine

## 2013-03-07 NOTE — Telephone Encounter (Signed)
Patient is aware 

## 2013-03-08 ENCOUNTER — Ambulatory Visit (HOSPITAL_COMMUNITY)
Admission: RE | Admit: 2013-03-08 | Discharge: 2013-03-08 | Disposition: A | Discharge status: Home / Self Care | Payer: Medicare Other | Source: Ambulatory Visit | Attending: Family Medicine | Admitting: Family Medicine

## 2013-03-08 DIAGNOSIS — B20 Human immunodeficiency virus [HIV] disease: Secondary | ICD-10-CM

## 2013-03-08 DIAGNOSIS — R599 Enlarged lymph nodes, unspecified: Secondary | ICD-10-CM | POA: Diagnosis not present

## 2013-03-08 MED ORDER — IOHEXOL 300 MG/ML  SOLN
75.0000 mL | Freq: Once | INTRAMUSCULAR | Status: AC | PRN
  Administered 2013-03-08: 75 mL via INTRAVENOUS

## 2013-03-09 ENCOUNTER — Other Ambulatory Visit (INDEPENDENT_AMBULATORY_CARE_PROVIDER_SITE_OTHER): Payer: Medicare Other

## 2013-03-09 ENCOUNTER — Other Ambulatory Visit: Payer: Self-pay | Admitting: Infectious Diseases

## 2013-03-09 DIAGNOSIS — Z113 Encounter for screening for infections with a predominantly sexual mode of transmission: Secondary | ICD-10-CM

## 2013-03-09 DIAGNOSIS — E7800 Pure hypercholesterolemia, unspecified: Secondary | ICD-10-CM | POA: Diagnosis not present

## 2013-03-09 DIAGNOSIS — B20 Human immunodeficiency virus [HIV] disease: Secondary | ICD-10-CM

## 2013-03-09 LAB — COMPLETE METABOLIC PANEL WITH GFR
ALT: 20 U/L (ref 0–53)
AST: 20 U/L (ref 0–37)
Albumin: 4.2 g/dL (ref 3.5–5.2)
Alkaline Phosphatase: 108 U/L (ref 39–117)
BUN: 15 mg/dL (ref 6–23)
CO2: 27 mEq/L (ref 19–32)
Calcium: 9.7 mg/dL (ref 8.4–10.5)
Chloride: 99 mEq/L (ref 96–112)
Creat: 1.02 mg/dL (ref 0.50–1.35)
GFR, Est African American: >89 mL/min
GFR, Est Non African American: 82 mL/min
Glucose, Bld: 103 mg/dL — ABNORMAL HIGH (ref 70–99)
Potassium: 4 mEq/L (ref 3.5–5.3)
Sodium: 139 mEq/L (ref 135–145)
Total Bilirubin: 0.6 mg/dL (ref 0.3–1.2)
Total Protein: 7.5 g/dL (ref 6.0–8.3)

## 2013-03-09 LAB — CBC WITH DIFFERENTIAL/PLATELET
Basophils Absolute: 0.1 10*3/uL (ref 0.0–0.1)
Basophils Relative: 1 % (ref 0–1)
Eosinophils Absolute: 0.4 10*3/uL (ref 0.0–0.7)
Eosinophils Relative: 4 % (ref 0–5)
HCT: 50.4 % (ref 39.0–52.0)
Hemoglobin: 18.1 g/dL — ABNORMAL HIGH (ref 13.0–17.0)
Lymphocytes Relative: 29 % (ref 12–46)
Lymphs Abs: 2.9 10*3/uL (ref 0.7–4.0)
MCH: 32.7 pg (ref 26.0–34.0)
MCHC: 35.9 g/dL (ref 30.0–36.0)
MCV: 91.1 fL (ref 78.0–100.0)
Monocytes Absolute: 1 10*3/uL (ref 0.1–1.0)
Monocytes Relative: 10 % (ref 3–12)
Neutro Abs: 5.6 10*3/uL (ref 1.7–7.7)
Neutrophils Relative %: 56 % (ref 43–77)
Platelets: 313 10*3/uL (ref 150–400)
RBC: 5.53 MIL/uL (ref 4.22–5.81)
RDW: 13.9 % (ref 11.5–15.5)
WBC: 9.9 10*3/uL (ref 4.0–10.5)

## 2013-03-09 LAB — LIPID PANEL
Cholesterol: 147 mg/dL (ref 0–200)
HDL: 33 mg/dL — ABNORMAL LOW (ref >39)
LDL Cholesterol: 84 mg/dL (ref 0–99)
Total CHOL/HDL Ratio: 4.5 Ratio
Triglycerides: 150 mg/dL — ABNORMAL HIGH (ref <150)
VLDL: 30 mg/dL (ref 0–40)

## 2013-03-09 LAB — RPR

## 2013-03-10 LAB — T-HELPER CELL (CD4) - (RCID CLINIC ONLY)
CD4 % Helper T Cell: 22 % — ABNORMAL LOW (ref 33–55)
CD4 T Cell Abs: 590 uL (ref 400–2700)

## 2013-03-10 LAB — HIV-1 RNA QUANT-NO REFLEX-BLD
HIV 1 RNA Quant: 27 copies/mL — ABNORMAL HIGH (ref <20)
HIV-1 RNA Quant, Log: 1.43 {Log} — ABNORMAL HIGH (ref <1.30)

## 2013-03-11 ENCOUNTER — Telehealth: Payer: Self-pay | Admitting: Family Medicine

## 2013-03-11 MED ORDER — CYCLOBENZAPRINE HCL 10 MG PO TABS: 10.0000 mg | ORAL_TABLET | Freq: Three times a day (TID) | ORAL | Status: DC | PRN

## 2013-03-11 NOTE — Telephone Encounter (Signed)
Pt given, results of CT of neck, will discuss with ID, before any further referrals as it looks like sequale of HIV  He still has some pain on his right side, worse after plowing in the yard, feels sore like a spasm, urinating without difficulty, though he said it did get worse after having his CT Will put him on muscle relaxant over the weekend he will call if not improved on Monday

## 2013-03-23 ENCOUNTER — Ambulatory Visit (INDEPENDENT_AMBULATORY_CARE_PROVIDER_SITE_OTHER): Payer: Medicare Other | Admitting: Infectious Disease

## 2013-03-23 ENCOUNTER — Encounter: Payer: Self-pay | Admitting: Infectious Disease

## 2013-03-23 VITALS — BP 126/85 | HR 89 | Temp 98.3°F | Ht 71.0 in | Wt 246.0 lb

## 2013-03-23 DIAGNOSIS — I10 Essential (primary) hypertension: Secondary | ICD-10-CM

## 2013-03-23 DIAGNOSIS — E119 Type 2 diabetes mellitus without complications: Secondary | ICD-10-CM | POA: Diagnosis not present

## 2013-03-23 DIAGNOSIS — I889 Nonspecific lymphadenitis, unspecified: Secondary | ICD-10-CM | POA: Diagnosis not present

## 2013-03-23 DIAGNOSIS — Z113 Encounter for screening for infections with a predominantly sexual mode of transmission: Secondary | ICD-10-CM | POA: Diagnosis not present

## 2013-03-23 DIAGNOSIS — E785 Hyperlipidemia, unspecified: Secondary | ICD-10-CM | POA: Diagnosis not present

## 2013-03-23 DIAGNOSIS — B20 Human immunodeficiency virus [HIV] disease: Secondary | ICD-10-CM | POA: Diagnosis not present

## 2013-03-23 NOTE — Progress Notes (Signed)
  Subjective:    Patient ID: Hunter Pratt, male    DOB: Jul 21, 1956, 57 y.o.   MRN: [id number]  HPI   Hunter Pratt is a 57 y.o. male who is doing superbly well on his antiviral regimen, of intelence Isentress and Truvada with undetectable viral load and health cd4 count.   He had recent CT showing cervical LA ..thought this was after he had suffered from URI.  He has no weight loss, fevers or other systemic symptoms.    Review of Systems  Constitutional: Negative for fever, chills, diaphoresis, activity change, appetite change, fatigue and unexpected weight change.  HENT: Positive for congestion, facial swelling, rhinorrhea, sneezing and postnasal drip. Negative for sore throat, trouble swallowing and sinus pressure.   Eyes: Negative for photophobia and visual disturbance.  Respiratory: Negative for cough, chest tightness, shortness of breath, wheezing and stridor.   Cardiovascular: Negative for chest pain, palpitations and leg swelling.  Gastrointestinal: Negative for nausea, vomiting, abdominal pain, diarrhea, constipation, blood in stool, abdominal distention and anal bleeding.  Genitourinary: Negative for dysuria, hematuria, flank pain and difficulty urinating.  Musculoskeletal: Negative for myalgias, back pain, joint swelling, arthralgias and gait problem.  Skin: Negative for color change, pallor, rash and wound.  Neurological: Negative for dizziness, tremors, weakness and light-headedness.  Hematological: Positive for adenopathy. Does not bruise/bleed easily.  Psychiatric/Behavioral: Negative for behavioral problems, confusion, sleep disturbance, dysphoric mood, decreased concentration and agitation.       Objective:   Physical Exam  Constitutional: He is oriented to person, place, and time. He appears well-developed and well-nourished. No distress.  HENT:  Head: Normocephalic and atraumatic.  Mouth/Throat: Oropharynx is clear and moist. No oropharyngeal exudate.  Eyes:  Conjunctivae and EOM are normal. Pupils are equal, round, and reactive to light. No scleral icterus.  Neck: Normal range of motion. Neck supple. No JVD present.  Cardiovascular: Normal rate, regular rhythm and normal heart sounds.  Exam reveals no gallop and no friction rub.   No murmur heard. Pulmonary/Chest: Effort normal and breath sounds normal. No respiratory distress. He has no wheezes. He has no rales. He exhibits no tenderness.  Abdominal: He exhibits no distension and no mass. There is no tenderness. There is no rebound and no guarding.  Musculoskeletal: He exhibits no edema and no tenderness.  Lymphadenopathy:       Head (right side): Preauricular adenopathy present.    He has no cervical adenopathy.  Subtle cervical lymphadenopathy  Neurological: He is alert and oriented to person, place, and time. He has normal reflexes. He exhibits normal muscle tone. Coordination normal.  Skin: Skin is warm and dry. He is not diaphoretic. No erythema. No pallor.  Psychiatric: He has a normal mood and affect. His behavior is normal. Judgment and thought content normal.          Assessment & Plan:  HIV: continue current regimen of intelence, isentress and truvada  Cervical LA:  I would NOT yet pusue workup of thse LN given that he DID have recent URI. I would have him FU with PCP in the next 2 months and at most consider repeat CT of neck 3 months from the first one   HTN: defer to pcp  DM: a1c 6.2

## 2013-05-02 ENCOUNTER — Emergency Department (HOSPITAL_COMMUNITY): Payer: Medicare Other

## 2013-05-02 ENCOUNTER — Emergency Department (HOSPITAL_COMMUNITY)
Admission: EM | Admit: 2013-05-02 | Discharge: 2013-05-02 | Disposition: A | Discharge status: Home / Self Care | Payer: Medicare Other | Attending: Emergency Medicine | Admitting: Emergency Medicine

## 2013-05-02 ENCOUNTER — Encounter (HOSPITAL_COMMUNITY): Payer: Self-pay | Admitting: Emergency Medicine

## 2013-05-02 DIAGNOSIS — Z21 Asymptomatic human immunodeficiency virus [HIV] infection status: Secondary | ICD-10-CM | POA: Diagnosis not present

## 2013-05-02 DIAGNOSIS — R319 Hematuria, unspecified: Secondary | ICD-10-CM

## 2013-05-02 DIAGNOSIS — M5030 Other cervical disc degeneration, unspecified cervical region: Secondary | ICD-10-CM | POA: Diagnosis not present

## 2013-05-02 DIAGNOSIS — R109 Unspecified abdominal pain: Secondary | ICD-10-CM

## 2013-05-02 DIAGNOSIS — Z87891 Personal history of nicotine dependence: Secondary | ICD-10-CM | POA: Insufficient documentation

## 2013-05-02 DIAGNOSIS — H9190 Unspecified hearing loss, unspecified ear: Secondary | ICD-10-CM | POA: Insufficient documentation

## 2013-05-02 DIAGNOSIS — Z87442 Personal history of urinary calculi: Secondary | ICD-10-CM | POA: Diagnosis not present

## 2013-05-02 DIAGNOSIS — K7689 Other specified diseases of liver: Secondary | ICD-10-CM | POA: Diagnosis not present

## 2013-05-02 DIAGNOSIS — IMO0002 Reserved for concepts with insufficient information to code with codable children: Secondary | ICD-10-CM | POA: Diagnosis not present

## 2013-05-02 DIAGNOSIS — R300 Dysuria: Secondary | ICD-10-CM | POA: Insufficient documentation

## 2013-05-02 DIAGNOSIS — Z79899 Other long term (current) drug therapy: Secondary | ICD-10-CM | POA: Insufficient documentation

## 2013-05-02 DIAGNOSIS — Z8711 Personal history of peptic ulcer disease: Secondary | ICD-10-CM | POA: Insufficient documentation

## 2013-05-02 DIAGNOSIS — R3915 Urgency of urination: Secondary | ICD-10-CM | POA: Insufficient documentation

## 2013-05-02 DIAGNOSIS — K219 Gastro-esophageal reflux disease without esophagitis: Secondary | ICD-10-CM | POA: Insufficient documentation

## 2013-05-02 DIAGNOSIS — Z8679 Personal history of other diseases of the circulatory system: Secondary | ICD-10-CM | POA: Diagnosis not present

## 2013-05-02 DIAGNOSIS — M545 Low back pain, unspecified: Secondary | ICD-10-CM | POA: Insufficient documentation

## 2013-05-02 DIAGNOSIS — R10A0 Flank pain, unspecified side: Secondary | ICD-10-CM

## 2013-05-02 DIAGNOSIS — M1710 Unilateral primary osteoarthritis, unspecified knee: Secondary | ICD-10-CM | POA: Insufficient documentation

## 2013-05-02 DIAGNOSIS — E785 Hyperlipidemia, unspecified: Secondary | ICD-10-CM | POA: Insufficient documentation

## 2013-05-02 LAB — URINALYSIS, ROUTINE W REFLEX MICROSCOPIC
Bilirubin Urine: NEGATIVE
Glucose, UA: NEGATIVE mg/dL
Ketones, ur: NEGATIVE mg/dL
Leukocytes, UA: NEGATIVE
Nitrite: NEGATIVE
Specific Gravity, Urine: >1.03 — ABNORMAL HIGH (ref 1.005–1.030)
Urobilinogen, UA: 0.2 mg/dL (ref 0.0–1.0)
pH: 5.5 (ref 5.0–8.0)

## 2013-05-02 LAB — POCT I-STAT, CHEM 8
BUN: 18 mg/dL (ref 6–23)
Calcium, Ion: 1.09 mmol/L — ABNORMAL LOW (ref 1.12–1.23)
Chloride: 103 mEq/L (ref 96–112)
Creatinine, Ser: 1.1 mg/dL (ref 0.50–1.35)
Glucose, Bld: 112 mg/dL — ABNORMAL HIGH (ref 70–99)
HCT: 57 % — ABNORMAL HIGH (ref 39.0–52.0)
Hemoglobin: 19.4 g/dL — ABNORMAL HIGH (ref 13.0–17.0)
Potassium: 4.5 mEq/L (ref 3.5–5.1)
Sodium: 142 mEq/L (ref 135–145)
TCO2: 25 mmol/L (ref 0–100)

## 2013-05-02 LAB — URINE MICROSCOPIC-ADD ON

## 2013-05-02 MED ORDER — SODIUM CHLORIDE 0.9 % IV BOLUS (SEPSIS)
1000.0000 mL | Freq: Once | INTRAVENOUS | Status: AC
  Administered 2013-05-02: 1000 mL via INTRAVENOUS

## 2013-05-02 MED ORDER — ACETAMINOPHEN 325 MG PO TABS
650.0000 mg | ORAL_TABLET | Freq: Once | ORAL | Status: AC
  Administered 2013-05-02: 650 mg via ORAL
  Filled 2013-05-02: qty 2

## 2013-05-02 NOTE — ED Provider Notes (Signed)
History     This chart was scribed for Hunter Quarry, MD, MD by Hunter Pratt, ED Scribe. The patient was seen in room APA11/[facility]11 and the patient's care was started at 7:40 AM.   CSN: [id number]  Arrival date & time 05/02/13  4098      Chief Complaint  Patient presents with  . Flank Pain     Patient is a 57 y.o. male presenting with flank pain. The history is provided by the patient and medical records. No language interpreter was used.  Flank Pain This is a recurrent problem. The current episode started 6 to 12 hours ago. The problem occurs constantly. The problem has not changed since onset.Nothing aggravates the symptoms. He has tried nothing for the symptoms.   HPI Comments: Hunter Pratt is a 57 y.o. male with hx of HIV (diagnosed 79) who presents to the Emergency Department complaining of constant, moderate lower back pain onset 2 days ago. He states that the pain felt like muscle spasms. He reports today he had increased urgency and difficulty urinating. He mentions having bilateral flank pain with pain worse on the left flank. Pt reports hx of kidney stones and current symptoms feel similar. He mentions that he took asa today without relief. Pt denies hematuria, dysuria, fever, chills, nausea, vomiting, diarrhea, weakness, cough, SOB and any other pain. He denies hx of DM. He reports hx of kidney failure after taking NSAID. He denies drinking alcohol or using illegal drugs. He states his viral load is undetectable.   PCP is Hunter Pratt  Pt does not have urologist.   Past Medical History  Diagnosis Date  . HIV infection   . Arthritis   . GERD (gastroesophageal reflux disease)   . Kidney stone   . PUD (peptic ulcer disease)   . DDD (degenerative disc disease), cervical   . OA (osteoarthritis) of knee   . Hearing loss     Bilateral  . Hyperlipidemia   . Systolic dysfunction     EF 40-45%    Past Surgical History  Procedure Laterality Date  . Cervical discectomy  1990  and 1991    Family History  Problem Relation Age of Onset  . Hypertension Mother   . Osteoporosis Mother   . Osteoporosis Father   . Depression Father   . Hearing loss Father   . Osteoporosis Sister   . Arthritis Sister   . Hypertension Brother   . Hyperlipidemia Brother   . Depression Brother     History  Substance Use Topics  . Smoking status: Former Smoker    Types: Cigarettes  . Smokeless tobacco: Never Used  . Alcohol Use: No      Review of Systems  Constitutional: Negative for fever and chills.  Gastrointestinal: Negative for nausea and vomiting.  Genitourinary: Positive for urgency and flank pain.  All other systems reviewed and are negative.    Allergies  Naproxen sodium and Nsaids  Home Medications   Current Outpatient Rx  Name  Route  Sig  Dispense  Refill  . cyclobenzaprine (FLEXERIL) 10 MG tablet   Oral   Take 1 tablet (10 mg total) by mouth every 8 (eight) hours as needed for muscle spasms.   30 tablet   0   . emtricitabine-tenofovir (TRUVADA) 200-300 MG per tablet   Oral   Take 1 tablet by mouth daily.   30 tablet   11   . fluocinonide gel (LIDEX) 0.05 %   Topical  Apply topically 2 (two) times daily.   60 g   2     Dispense 4 tubes of 15 gram   . furosemide (LASIX) 20 MG tablet   Oral   Take 1 tablet (20 mg total) by mouth daily.   30 tablet   11   . INTELENCE 200 MG TABS   Oral   Take 200 mg by mouth 2 (two) times daily.   60 tablet   11     Dispense as written.   . metoprolol succinate (TOPROL XL) 25 MG 24 hr tablet      Take 1/2 tablet once a day for blood pressure and heart   30 tablet   3   . omeprazole (PRILOSEC) 20 MG capsule   Oral   Take 2 capsules (40 mg total) by mouth daily.   60 capsule   11   . pravastatin (PRAVACHOL) 20 MG tablet   Oral   Take 1 tablet (20 mg total) by mouth daily.   30 tablet   11   . raltegravir (ISENTRESS) 400 MG tablet   Oral   Take 1 tablet (400 mg total) by mouth 2  (two) times daily.   60 tablet   11     BP 144/87  Pulse 89  Temp(Src) 97.9 F (36.6 C) (Oral)  Resp 20  Ht 5\' 11"  (1.803 m)  Wt 240 lb (108.863 kg)  BMI 33.49 kg/m2  SpO2 95%  Physical Exam  Nursing note and vitals reviewed. Constitutional: He is oriented to person, place, and time. He appears well-developed and well-nourished. No distress.  HENT:  Head: Normocephalic and atraumatic.  Right Ear: External ear normal.  Left Ear: External ear normal.  Nose: Nose normal.  Mouth/Throat: Oropharynx is clear and moist.  Eyes: Conjunctivae and EOM are normal. Pupils are equal, round, and reactive to light.  Neck: Normal range of motion. Neck supple. No tracheal deviation present.  Cardiovascular: Normal rate, regular rhythm, normal heart sounds and intact distal pulses.   No murmur heard. Pulmonary/Chest: Effort normal and breath sounds normal. No respiratory distress. He has no wheezes. He has no rales. He exhibits no tenderness.  Abdominal: Soft. Bowel sounds are normal. He exhibits no distension and no mass. There is no tenderness. There is no guarding.  Musculoskeletal: Normal range of motion.  Neurological: He is alert and oriented to person, place, and time. He has normal reflexes. He exhibits normal muscle tone. Coordination normal.  Skin: Skin is warm and dry.  Psychiatric: He has a normal mood and affect. His behavior is normal. Judgment and thought content normal.    ED Course  Procedures (including critical care time) DIAGNOSTIC STUDIES: Oxygen Saturation is 95% on room air, adequate by my interpretation.    COORDINATION OF CARE: 7:44 AM Discussed ED treatment with pt and pt agrees.  7:47 AM Ordered:  Medications  sodium chloride 0.9 % bolus 1,000 mL (1,000 mLs Intravenous New Bag/Given 05/02/13 0815)  acetaminophen (TYLENOL) tablet 650 mg (650 mg Oral Given 05/02/13 0820)       Labs Reviewed  URINALYSIS, ROUTINE W REFLEX MICROSCOPIC - Abnormal; Notable for the  following:    Specific Gravity, Urine >1.030 (*)    Hgb urine dipstick LARGE (*)    Protein, ur TRACE (*)    All other components within normal limits  URINE MICROSCOPIC-ADD ON   No results found.   No diagnosis found.  Results for orders placed during the hospital encounter of 05/02/13  URINALYSIS, ROUTINE W REFLEX MICROSCOPIC      Result Value Range   Color, Urine YELLOW  YELLOW   APPearance CLEAR  CLEAR   Specific Gravity, Urine >1.030 (*) 1.005 - 1.030   pH 5.5  5.0 - 8.0   Glucose, UA NEGATIVE  NEGATIVE mg/dL   Hgb urine dipstick LARGE (*) NEGATIVE   Bilirubin Urine NEGATIVE  NEGATIVE   Ketones, ur NEGATIVE  NEGATIVE mg/dL   Protein, ur TRACE (*) NEGATIVE mg/dL   Urobilinogen, UA 0.2  0.0 - 1.0 mg/dL   Nitrite NEGATIVE  NEGATIVE   Leukocytes, UA NEGATIVE  NEGATIVE  URINE MICROSCOPIC-ADD ON      Result Value Range   RBC / HPF 7-10  <3 RBC/hpf  POCT I-STAT, CHEM 8      Result Value Range   Sodium 142  135 - 145 mEq/L   Potassium 4.5  3.5 - 5.1 mEq/L   Chloride 103  96 - 112 mEq/L   BUN 18  6 - 23 mg/dL   Creatinine, Ser 1.61  0.50 - 1.35 mg/dL   Glucose, Bld 096 (*) 70 - 99 mg/dL   Calcium, Ion 0.45 (*) 1.12 - 1.23 mmol/L   TCO2 25  0 - 100 mmol/L   Hemoglobin 19.4 (*) 13.0 - 17.0 g/dL   HCT 40.9 (*) 81.1 - 91.4 %      Patient with low back pain, now with left flank pain.  Patient on antiretrovirals with history of kidney stones.  He has hematuria but no kidney stone seen on ct.  No evidence of uti.  Plan urine culture and patient advised to follow up with Dr. Daiva Eves or his pmd in 3 days for results.  Given lack of uti symptoms will not treat at this time.  Patient with pain in back last week and now pain in low flank- he may have recently passed a small kidney stone given his meds and history, as patient describes this like previous kidney stone pain.  He states he has had renal failure with nsaids and a history of substance abuse and has not taken narcotics for an  extended period of time.  Given his moderate pain and no acute abnormalities seen, plan tylenol as needed for pain.  Patient advise of need for close follow up, return precautions for worsening symptoms especially pain or fever.       I personally performed the services described in this documentation, which was scribed in my presence. The recorded information has been reviewed and considered.   Hunter Quarry, MD 05/02/13 [phone]

## 2013-05-02 NOTE — ED Notes (Signed)
Patient complaining of bilateral flank pain x 3 days, worse on left side.

## 2013-05-03 ENCOUNTER — Ambulatory Visit (INDEPENDENT_AMBULATORY_CARE_PROVIDER_SITE_OTHER): Payer: Medicare Other | Admitting: Family Medicine

## 2013-05-03 ENCOUNTER — Encounter: Payer: Self-pay | Admitting: Family Medicine

## 2013-05-03 VITALS — BP 120/80 | HR 84 | Temp 97.5°F | Resp 16 | Ht 67.5 in | Wt 245.0 lb

## 2013-05-03 DIAGNOSIS — R109 Unspecified abdominal pain: Secondary | ICD-10-CM | POA: Diagnosis not present

## 2013-05-03 DIAGNOSIS — M549 Dorsalgia, unspecified: Secondary | ICD-10-CM

## 2013-05-03 DIAGNOSIS — R10A0 Flank pain, unspecified side: Secondary | ICD-10-CM

## 2013-05-03 DIAGNOSIS — Z1211 Encounter for screening for malignant neoplasm of colon: Secondary | ICD-10-CM | POA: Diagnosis not present

## 2013-05-03 DIAGNOSIS — R319 Hematuria, unspecified: Secondary | ICD-10-CM | POA: Diagnosis not present

## 2013-05-03 LAB — URINALYSIS, MICROSCOPIC ONLY
Casts: NONE SEEN
Crystals: NONE SEEN

## 2013-05-03 LAB — URINALYSIS, ROUTINE W REFLEX MICROSCOPIC
Bilirubin Urine: NEGATIVE
Glucose, UA: NEGATIVE mg/dL
Ketones, ur: NEGATIVE mg/dL
Leukocytes, UA: NEGATIVE
Nitrite: NEGATIVE
Protein, ur: 30 mg/dL — AB
Specific Gravity, Urine: >1.03 — ABNORMAL HIGH (ref 1.005–1.030)
Urobilinogen, UA: 0.2 mg/dL (ref 0.0–1.0)
pH: 6 (ref 5.0–8.0)

## 2013-05-03 LAB — URINE CULTURE
Colony Count: NO GROWTH
Culture: NO GROWTH

## 2013-05-03 MED ORDER — TAMSULOSIN HCL 0.4 MG PO CAPS: 0.4000 mg | ORAL_CAPSULE | Freq: Every day | ORAL | Status: DC

## 2013-05-03 MED ORDER — HYDROCODONE-ACETAMINOPHEN 5-325 MG PO TABS: 1.0000 | ORAL_TABLET | Freq: Four times a day (QID) | ORAL | Status: DC | PRN

## 2013-05-03 MED ORDER — HYDROCODONE-ACETAMINOPHEN 10-325 MG PO TABS: 1.0000 | ORAL_TABLET | Freq: Four times a day (QID) | ORAL | Status: DC | PRN

## 2013-05-03 NOTE — Progress Notes (Signed)
  Subjective:    Patient ID: Hunter Pratt, male    DOB: 1955/12/25, 57 y.o.   MRN: [id number]  HPI   Patient presents to followup ER visit for left-sided flank pain and hematuria. He was plowing the yard over the weekend and then noticed increasing back pain more on the left side. He then noticed it started to radiate to the front therefore he sought care in the ER as this felt similar to his previous kidney stones. He has had some difficulty with his urine stream but denies any dysuria. Urine culture was obtained which was negative. He did have a CT scan of abdomen and pelvis which did not show any stone but he did note a benign cyst and mild hyperplasia of the adrenal gland. He has been taken tylenol without any relief, no meds given from ER. Last night he took a laxative thinking possibly his bowels were backed up however he has not been straining and has not had any blood in the stool. He's also due for his repeat colonoscopy     Review of Systems - per above  GEN- denies fatigue, fever, weight loss,weakness, recent illness HEENT- denies eye drainage, change in vision, nasal discharge, CVS- denies chest pain, palpitations RESP- denies SOB, cough, wheeze ABD- denies N/V, change in stools, +abd pain GU- denies dysuria, hematuria, dribbling, incontinence MSK- +joint pain, muscle aches, injury Neuro- denies headache, dizziness, syncope, seizure activity      Objective:   Physical Exam GEN- NAD, alert and oriented x3 HEENT- PERRL, EOMI, non injected sclera, pink conjunctiva, MMM, oropharynx clear CVS- RRR, no murmur RESP-CTAB ABD-NABS,soft, mild TTP LUQ, no rebound, no guarding, no HSM, equivocal CVA tenderness left side, no suprapubic tenderness MSK- + paraspinal spasm left side of back, Spine NT, neg SLR EXT- No edema Pulses- Radial, DP- 2+        Assessment & Plan:

## 2013-05-03 NOTE — Patient Instructions (Addendum)
Start the medication Flomax as to help your urine stream and will also help if you have a kidney stone Take hydrocodone as needed for pain Referral for colonoscopy Referral to urology for blood in the urine Followup 3 months

## 2013-05-03 NOTE — Assessment & Plan Note (Addendum)
With his hematuria there is concern that he may be passing a kidney stone not shown on CT as  he is also having some urinary hesitancy. I will start him on Flomax and he will be referred to urology

## 2013-05-03 NOTE — Assessment & Plan Note (Signed)
Urology referral for further workup started on Flomax

## 2013-05-03 NOTE — Assessment & Plan Note (Signed)
His pain could all be related to spasm from his plowing this weekend.  Will start him on hydrocodone for pain. She will have the following workup as stated above

## 2013-05-05 ENCOUNTER — Encounter: Payer: Self-pay | Admitting: Gastroenterology

## 2013-05-06 DIAGNOSIS — N23 Unspecified renal colic: Secondary | ICD-10-CM | POA: Diagnosis not present

## 2013-05-06 DIAGNOSIS — Z87442 Personal history of urinary calculi: Secondary | ICD-10-CM | POA: Diagnosis not present

## 2013-05-06 DIAGNOSIS — R3129 Other microscopic hematuria: Secondary | ICD-10-CM | POA: Diagnosis not present

## 2013-06-04 ENCOUNTER — Other Ambulatory Visit: Payer: Self-pay | Admitting: Family Medicine

## 2013-06-13 ENCOUNTER — Ambulatory Visit (AMBULATORY_SURGERY_CENTER): Payer: Medicare Other | Admitting: *Deleted

## 2013-06-13 VITALS — Ht 71.0 in | Wt 246.0 lb

## 2013-06-13 DIAGNOSIS — Z860100 Personal history of colon polyps, unspecified: Secondary | ICD-10-CM

## 2013-06-13 MED ORDER — MOVIPREP 100 G PO SOLR: 1.0000 | Freq: Once | ORAL | Status: DC

## 2013-06-13 NOTE — Progress Notes (Signed)
No egg or soy allergy. No anesthesia problems.  

## 2013-06-28 ENCOUNTER — Encounter: Payer: Self-pay | Admitting: Gastroenterology

## 2013-06-28 ENCOUNTER — Ambulatory Visit (AMBULATORY_SURGERY_CENTER): Payer: Medicare Other | Admitting: Gastroenterology

## 2013-06-28 VITALS — BP 151/93 | HR 82 | Temp 97.5°F | Resp 16 | Ht 71.0 in | Wt 246.0 lb

## 2013-06-28 DIAGNOSIS — K5730 Diverticulosis of large intestine without perforation or abscess without bleeding: Secondary | ICD-10-CM

## 2013-06-28 DIAGNOSIS — Z860100 Personal history of colon polyps, unspecified: Secondary | ICD-10-CM

## 2013-06-28 DIAGNOSIS — D126 Benign neoplasm of colon, unspecified: Secondary | ICD-10-CM

## 2013-06-28 MED ORDER — SODIUM CHLORIDE 0.9 % IV SOLN: 500.0000 mL | INTRAVENOUS | Status: DC

## 2013-06-28 NOTE — Progress Notes (Signed)
Patient did not have preoperative order for IV antibiotic SSI prophylaxis. (G8918)  Patient did not experience any of the following events: a burn prior to discharge; a fall within the facility; wrong site/side/patient/procedure/implant event; or a hospital transfer or hospital admission upon discharge from the facility. (G8907)  

## 2013-06-28 NOTE — Patient Instructions (Addendum)
YOU HAD AN ENDOSCOPIC PROCEDURE TODAY AT THE [facility]: Refer to the procedure report that was given to you for any specific questions about what was found during the examination.  If the procedure report does not answer your questions, please call your gastroenterologist to clarify.  If you requested that your care partner not be given the details of your procedure findings, then the procedure report has been included in a sealed envelope for you to review at your convenience later.  YOU SHOULD EXPECT: Some feelings of bloating in the abdomen. Passage of more gas than usual.  Walking can help get rid of the air that was put into your GI tract during the procedure and reduce the bloating. If you had a lower endoscopy (such as a colonoscopy or flexible sigmoidoscopy) you may notice spotting of blood in your stool or on the toilet paper. If you underwent a bowel prep for your procedure, then you may not have a normal bowel movement for a few days.  DIET: Your first meal following the procedure should be a light meal and then it is ok to progress to your normal diet.  A half-sandwich or bowl of soup is an example of a good first meal.  Heavy or fried foods are harder to digest and may make you feel nauseous or bloated.  Likewise meals heavy in dairy and vegetables can cause extra gas to form and this can also increase the bloating.  Drink plenty of fluids but you should avoid alcoholic beverages for 24 hours.  ACTIVITY: Your care partner should take you home directly after the procedure.  You should plan to take it easy, moving slowly for the rest of the day.  You can resume normal activity the day after the procedure however you should NOT DRIVE or use heavy machinery for 24 hours (because of the sedation medicines used during the test).    SYMPTOMS TO REPORT IMMEDIATELY: A gastroenterologist can be reached at any hour.  During normal business hours, 8:30 AM to 5:00 PM Monday through Friday,  call [phone].  After hours and on weekends, please call the GI answering service at [phone] who will take a message and have the physician on call contact you.   Following lower endoscopy (colonoscopy or flexible sigmoidoscopy):  Excessive amounts of blood in the stool  Significant tenderness or worsening of abdominal pains  Swelling of the abdomen that is new, acute  Fever of 100F or higher  FOLLOW UP: If any biopsies were taken you will be contacted by phone or by letter within the next 1-3 weeks.  Call your gastroenterologist if you have not heard about the biopsies in 3 weeks.  Our staff will call the home number listed on your records the next business day following your procedure to check on you and address any questions or concerns that you may have at that time regarding the information given to you following your procedure. This is a courtesy call and so if there is no answer at the home number and we have not heard from you through the emergency physician on call, we will assume that you have returned to your regular daily activities without incident.  SIGNATURES/CONFIDENTIALITY: You and/or your care partner have signed paperwork which will be entered into your electronic medical record.  These signatures attest to the fact that that the information above on your After Visit Summary has been reviewed and is understood.  Full responsibility of the confidentiality of this   discharge information lies with you and/or your care-partner.  Recommendations If the polyps removed today are pre-cancerous polyps, you will need a repeat colonoscopy in 5 years. Otherwise you should continue to follow colorectal cancer screening guidelines for "routine risk" patients with a repeat colonoscopy in 10 years. Await pathology results

## 2013-06-28 NOTE — Op Note (Signed)
[facility] [address] [place], [address]   COLONOSCOPY PROCEDURE REPORT  PATIENT: Hunter Pratt, Hunter Pratt  MR#: [id number] BIRTHDATE: [birth year] , 56  yrs. old GENDER: Male ENDOSCOPIST: Rachael Fee, MD PROCEDURE DATE:  06/28/2013 PROCEDURE:   Colonoscopy with snare polypectomy First Screening Colonoscopy - Avg.  risk and is 50 yrs.  old or older - No.  Prior Negative Screening - Now for repeat screening. N/A  History of Adenoma - Now for follow-up colonoscopy & has been > or = to 3 yrs.  Yes hx of adenoma.  Has been 3 or more years since last colonoscopy.  Polyps Removed Today? Yes. ASA CLASS:   Class III INDICATIONS:one small adenoma removed by colonoscopy 2009. MEDICATIONS: Fentanyl 50 mcg IV, Versed 5 mg IV, and These medications were titrated to patient response per physician's verbal order  DESCRIPTION OF PROCEDURE:   After the risks benefits and alternatives of the procedure were thoroughly explained, informed consent was obtained.  A digital rectal exam revealed no abnormalities of the rectum.   The LB [id number]  endoscope was introduced through the anus and advanced to the cecum, which was identified by both the appendix and ileocecal valve. No adverse events experienced.   The quality of the prep was good.  The instrument was then slowly withdrawn as the colon was fully examined.  COLON FINDINGS: There were small diverticulum throughout the colon. Two polyps were found, removed and sent to path.  These were both sessile, ranged in size from 3-15mm, located in ascending and descending segments, removed with cold snare.  The examination was otherwise normal.  Retroflexed views revealed no abnormalities. The time to cecum=1 minutes 43 seconds.  Withdrawal time=8 minutes 04 seconds.  The scope was withdrawn and the procedure completed. COMPLICATIONS: There were no complications.  ENDOSCOPIC IMPRESSION: There were small diverticulum throughout the  colon. Two polyps were found, removed and sent to path. The examination was otherwise normal.  RECOMMENDATIONS: If the polyp(s) removed today are proven to be adenomatous (pre-cancerous) polyps, you will need a repeat colonoscopy in 5 years.  Otherwise you should continue to follow colorectal cancer screening guidelines for "routine risk" patients with colonoscopy in 10 years.  You will receive a letter within 1-2 weeks with the results of your biopsy as well as final recommendations.  Please call my office if you have not received a letter after 3 weeks.  eSigned:  Rachael Fee, MD 06/28/2013 1:42 PM

## 2013-06-29 ENCOUNTER — Telehealth: Payer: Self-pay | Admitting: *Deleted

## 2013-06-29 NOTE — Telephone Encounter (Signed)
  Follow up Call-  Call back number 06/28/2013  Post procedure Call Back phone  # [phone]  Permission to leave phone message Yes     Patient questions:  Do you have a fever, pain , or abdominal swelling? no Pain Score  0 *  Have you tolerated food without any problems? yes  Have you been able to return to your normal activities? yes  Do you have any questions about your discharge instructions: Diet   no Medications  no Follow up visit  no  Do you have questions or concerns about your Care? no  Actions: * If pain score is 4 or above: No action needed, pain <4.

## 2013-07-07 ENCOUNTER — Encounter: Payer: Self-pay | Admitting: Gastroenterology

## 2013-07-13 ENCOUNTER — Other Ambulatory Visit: Payer: Medicare Other

## 2013-07-13 DIAGNOSIS — Z113 Encounter for screening for infections with a predominantly sexual mode of transmission: Secondary | ICD-10-CM

## 2013-07-13 DIAGNOSIS — E119 Type 2 diabetes mellitus without complications: Secondary | ICD-10-CM | POA: Diagnosis not present

## 2013-07-13 DIAGNOSIS — E785 Hyperlipidemia, unspecified: Secondary | ICD-10-CM | POA: Diagnosis not present

## 2013-07-13 DIAGNOSIS — B20 Human immunodeficiency virus [HIV] disease: Secondary | ICD-10-CM

## 2013-07-13 LAB — CBC WITH DIFFERENTIAL/PLATELET
Basophils Absolute: 0.1 10*3/uL (ref 0.0–0.1)
Basophils Relative: 1 % (ref 0–1)
Eosinophils Absolute: 0.3 10*3/uL (ref 0.0–0.7)
Eosinophils Relative: 3 % (ref 0–5)
HCT: 49.6 % (ref 39.0–52.0)
Hemoglobin: 17.6 g/dL — ABNORMAL HIGH (ref 13.0–17.0)
Lymphocytes Relative: 28 % (ref 12–46)
Lymphs Abs: 2.7 10*3/uL (ref 0.7–4.0)
MCH: 33 pg (ref 26.0–34.0)
MCHC: 35.5 g/dL (ref 30.0–36.0)
MCV: 92.9 fL (ref 78.0–100.0)
Monocytes Absolute: 0.7 10*3/uL (ref 0.1–1.0)
Monocytes Relative: 8 % (ref 3–12)
Neutro Abs: 5.9 10*3/uL (ref 1.7–7.7)
Neutrophils Relative %: 60 % (ref 43–77)
Platelets: 293 10*3/uL (ref 150–400)
RBC: 5.34 MIL/uL (ref 4.22–5.81)
RDW: 13.9 % (ref 11.5–15.5)
WBC: 9.7 10*3/uL (ref 4.0–10.5)

## 2013-07-13 LAB — LIPID PANEL
Cholesterol: 145 mg/dL (ref 0–200)
HDL: 34 mg/dL — ABNORMAL LOW (ref >39)
LDL Cholesterol: 73 mg/dL (ref 0–99)
Total CHOL/HDL Ratio: 4.3 Ratio
Triglycerides: 188 mg/dL — ABNORMAL HIGH (ref <150)
VLDL: 38 mg/dL (ref 0–40)

## 2013-07-13 LAB — HEMOGLOBIN A1C
Hgb A1c MFr Bld: 6.2 % — ABNORMAL HIGH (ref <5.7)
Mean Plasma Glucose: 131 mg/dL — ABNORMAL HIGH (ref <117)

## 2013-07-13 LAB — RPR

## 2013-07-14 LAB — COMPLETE METABOLIC PANEL WITH GFR
ALT: 22 U/L (ref 0–53)
AST: 21 U/L (ref 0–37)
Albumin: 4.3 g/dL (ref 3.5–5.2)
Alkaline Phosphatase: 114 U/L (ref 39–117)
BUN: 10 mg/dL (ref 6–23)
CO2: 22 mEq/L (ref 19–32)
Calcium: 9.9 mg/dL (ref 8.4–10.5)
Chloride: 102 mEq/L (ref 96–112)
Creat: 1.27 mg/dL (ref 0.50–1.35)
GFR, Est African American: 73 mL/min
GFR, Est Non African American: 63 mL/min
Glucose, Bld: 209 mg/dL — ABNORMAL HIGH (ref 70–99)
Potassium: 4.8 mEq/L (ref 3.5–5.3)
Sodium: 139 mEq/L (ref 135–145)
Total Bilirubin: 0.4 mg/dL (ref 0.3–1.2)
Total Protein: 7.6 g/dL (ref 6.0–8.3)

## 2013-07-14 LAB — HIV-1 RNA QUANT-NO REFLEX-BLD
HIV 1 RNA Quant: <20 copies/mL (ref <20)
HIV-1 RNA Quant, Log: <1.3 {Log} (ref <1.30)

## 2013-07-14 LAB — T-HELPER CELL (CD4) - (RCID CLINIC ONLY)
CD4 % Helper T Cell: 24 % — ABNORMAL LOW (ref 33–55)
CD4 T Cell Abs: 750 uL (ref 400–2700)

## 2013-07-27 ENCOUNTER — Ambulatory Visit: Payer: Medicare Other

## 2013-07-27 ENCOUNTER — Ambulatory Visit (INDEPENDENT_AMBULATORY_CARE_PROVIDER_SITE_OTHER): Payer: Medicare Other | Admitting: Infectious Disease

## 2013-07-27 ENCOUNTER — Encounter: Payer: Self-pay | Admitting: Infectious Disease

## 2013-07-27 VITALS — BP 135/88 | HR 87 | Temp 98.6°F | Wt 243.0 lb

## 2013-07-27 DIAGNOSIS — E785 Hyperlipidemia, unspecified: Secondary | ICD-10-CM | POA: Diagnosis not present

## 2013-07-27 DIAGNOSIS — B20 Human immunodeficiency virus [HIV] disease: Secondary | ICD-10-CM | POA: Diagnosis not present

## 2013-07-27 DIAGNOSIS — Z23 Encounter for immunization: Secondary | ICD-10-CM | POA: Diagnosis not present

## 2013-07-27 DIAGNOSIS — Z113 Encounter for screening for infections with a predominantly sexual mode of transmission: Secondary | ICD-10-CM | POA: Diagnosis not present

## 2013-07-27 DIAGNOSIS — I10 Essential (primary) hypertension: Secondary | ICD-10-CM | POA: Insufficient documentation

## 2013-07-27 NOTE — Progress Notes (Signed)
  Subjective:    Patient ID: Hunter Pratt, male    DOB: Apr 18, 1956, 57 y.o.   MRN: [id number]  HPI   Hunter Pratt is a 57 y.o. male who is doing superbly well on his antiviral regimen, of intelence Isentress and Truvada with undetectable viral load and health cd4 count.  He is doing fairly well. Hga1c is above 6 but not at criteria for DM.  I have asked him to lose weight to help with this.     Review of Systems  Constitutional: Negative for fever, chills, diaphoresis, activity change, appetite change, fatigue and unexpected weight change.  HENT: Negative for congestion, sore throat, facial swelling, rhinorrhea, sneezing, trouble swallowing, postnasal drip and sinus pressure.   Eyes: Negative for photophobia and visual disturbance.  Respiratory: Negative for cough, chest tightness, shortness of breath, wheezing and stridor.   Cardiovascular: Negative for chest pain, palpitations and leg swelling.  Gastrointestinal: Negative for nausea, vomiting, abdominal pain, diarrhea, constipation, blood in stool, abdominal distention and anal bleeding.  Genitourinary: Negative for dysuria, hematuria, flank pain and difficulty urinating.  Musculoskeletal: Negative for myalgias, back pain, joint swelling, arthralgias and gait problem.  Skin: Negative for color change, pallor, rash and wound.  Neurological: Negative for dizziness, tremors, weakness and light-headedness.  Hematological: Negative for adenopathy. Does not bruise/bleed easily.  Psychiatric/Behavioral: Negative for behavioral problems, confusion, sleep disturbance, dysphoric mood, decreased concentration and agitation.       Objective:   Physical Exam  Constitutional: He is oriented to person, place, and time. He appears well-developed and well-nourished. No distress.  HENT:  Head: Normocephalic and atraumatic.  Mouth/Throat: Oropharynx is clear and moist. No oropharyngeal exudate.  Eyes: Conjunctivae and EOM are normal. Pupils are  equal, round, and reactive to light. No scleral icterus.  Neck: Normal range of motion. Neck supple. No JVD present.  Cardiovascular: Normal rate, regular rhythm and normal heart sounds.  Exam reveals no gallop and no friction rub.   No murmur heard. Pulmonary/Chest: Effort normal and breath sounds normal. No respiratory distress. He has no wheezes. He has no rales. He exhibits no tenderness.  Abdominal: He exhibits no distension and no mass. There is no tenderness. There is no rebound and no guarding.  Musculoskeletal: He exhibits no edema and no tenderness.  Lymphadenopathy:       Head (right side): Preauricular adenopathy present.    He has no cervical adenopathy.  Subtle cervical lymphadenopathy  Neurological: He is alert and oriented to person, place, and time. He has normal reflexes. He exhibits normal muscle tone. Coordination normal.  Skin: Skin is warm and dry. He is not diaphoretic. No erythema. No pallor.  Psychiatric: He has a normal mood and affect. His behavior is normal. Judgment and thought content normal.          Assessment & Plan:  HIV: continue current regimen of intelence, isentress and truvada  HTN: check microalbumin creatinine ratio, continue toprol  Prediabetes: a1c 6.2, needs to lose weight, likely needs metformin   Hyperlipidemia: LDL at goal

## 2013-08-03 ENCOUNTER — Encounter: Payer: Self-pay | Admitting: Family Medicine

## 2013-08-03 ENCOUNTER — Ambulatory Visit (INDEPENDENT_AMBULATORY_CARE_PROVIDER_SITE_OTHER): Payer: Medicare Other | Admitting: Family Medicine

## 2013-08-03 VITALS — BP 130/80 | HR 96 | Temp 97.4°F | Resp 20 | Wt 242.0 lb

## 2013-08-03 DIAGNOSIS — E785 Hyperlipidemia, unspecified: Secondary | ICD-10-CM | POA: Diagnosis not present

## 2013-08-03 DIAGNOSIS — I10 Essential (primary) hypertension: Secondary | ICD-10-CM

## 2013-08-03 DIAGNOSIS — R7302 Impaired glucose tolerance (oral): Secondary | ICD-10-CM

## 2013-08-03 DIAGNOSIS — R7309 Other abnormal glucose: Secondary | ICD-10-CM | POA: Diagnosis not present

## 2013-08-03 DIAGNOSIS — F4321 Adjustment disorder with depressed mood: Secondary | ICD-10-CM | POA: Insufficient documentation

## 2013-08-03 DIAGNOSIS — F4320 Adjustment disorder, unspecified: Secondary | ICD-10-CM | POA: Insufficient documentation

## 2013-08-03 MED ORDER — ZOLPIDEM TARTRATE 5 MG PO TABS: 5.0000 mg | ORAL_TABLET | Freq: Every evening | ORAL | Status: DC | PRN

## 2013-08-03 NOTE — Progress Notes (Signed)
  Subjective:    Patient ID: Hunter Pratt, male    DOB: Nov 21, 1956, 57 y.o.   MRN: [id number]  HPI  Pt here to f/u chronic medical problems. Labs reviewed, A1C was 6.2%, TG elevated at 188. Colonoscopy done polyps removed. Seen by urology and cleared.  Grief- Father died July 02, 2024he has had difficulty sleeping and often his mind wanders. He was there with his father when he died, and held him until paramedics came. He was upset because EMS and coroner was asking questions like he caused harm to his father resulting in death.  Review of Systems   GEN- denies fatigue, fever, weight loss,weakness, recent illness HEENT- denies eye drainage, change in vision, nasal discharge, CVS- denies chest pain, palpitations RESP- denies SOB, cough, wheeze ABD- denies N/V, change in stools, abd pain GU- denies dysuria, hematuria, dribbling, incontinence MSK- denies joint pain, muscle aches, injury Neuro- denies headache, dizziness, syncope, seizure activity      Objective:   Physical Exam  GEN- NAD, alert and oriented x3 HEENT- PERRL, EOMI, non injected sclera, pink conjunctiva, MMM, oropharynx clear CVS- RRR, no murmur RESP-CTAB EXT- No edema Pulses- Radial 2+ Psych- crying discussing fathers death, not anxious appearing, not depressed appearing otherwise      Assessment & Plan:

## 2013-08-03 NOTE — Assessment & Plan Note (Signed)
Trial of ambien at bedtime He has some family support

## 2013-08-03 NOTE — Assessment & Plan Note (Signed)
Well controlled 

## 2013-08-03 NOTE — Assessment & Plan Note (Signed)
Continue pravastatin Add Fish oil for elevated TG

## 2013-08-03 NOTE — Assessment & Plan Note (Signed)
Reviewed labs, A1C stable at 6.2, we have discussed diet and weight in detail, will give him 3 months of lifestyle changes, will repeat A1C, if unchanged or deteriorated, will start glucaphage

## 2013-08-03 NOTE — Patient Instructions (Addendum)
Fish oil 1 capsule twice a day  Work on diet and exercise We will recheck diabetes number in 3 months Continue all other medications Try the ambien for sleep  Call if you need anything F/U 3 months

## 2013-08-11 ENCOUNTER — Other Ambulatory Visit: Payer: Self-pay | Admitting: Family Medicine

## 2013-08-11 ENCOUNTER — Ambulatory Visit (INDEPENDENT_AMBULATORY_CARE_PROVIDER_SITE_OTHER): Payer: Medicare Other | Admitting: Family Medicine

## 2013-08-11 ENCOUNTER — Encounter: Payer: Self-pay | Admitting: Family Medicine

## 2013-08-11 VITALS — BP 90/70 | HR 80 | Temp 97.6°F | Resp 20 | Wt 243.0 lb

## 2013-08-11 DIAGNOSIS — M25519 Pain in unspecified shoulder: Secondary | ICD-10-CM

## 2013-08-11 DIAGNOSIS — M25511 Pain in right shoulder: Secondary | ICD-10-CM

## 2013-08-11 DIAGNOSIS — M25539 Pain in unspecified wrist: Secondary | ICD-10-CM | POA: Diagnosis not present

## 2013-08-11 DIAGNOSIS — M5030 Other cervical disc degeneration, unspecified cervical region: Secondary | ICD-10-CM | POA: Diagnosis not present

## 2013-08-11 DIAGNOSIS — M25531 Pain in right wrist: Secondary | ICD-10-CM

## 2013-08-11 MED ORDER — TRAMADOL HCL 50 MG PO TABS: 50.0000 mg | ORAL_TABLET | Freq: Four times a day (QID) | ORAL | Status: DC | PRN

## 2013-08-11 MED ORDER — METHYLPREDNISOLONE (PAK) 4 MG PO TABS: ORAL_TABLET | ORAL | Status: DC

## 2013-08-11 NOTE — Telephone Encounter (Signed)
Meds refilled.

## 2013-08-11 NOTE — Assessment & Plan Note (Signed)
We'll treat for possible strain in the shoulder versus pinched nerve as he does have some radiating symptoms from the shoulder to arm. I will give him a course of Medrol Dosepak as he has typically nausea vomiting and has had renal complications with NSAIDs. The symptoms in his wrist do not quite fit. If he is not improved with the Medrol Dosepak because he has had 2 instances required surgical intervention for slipped disc and nerve impingement in the cervical region I will have to obtain MRI of the neck. He does not have any weakness at this time which is what he presented with in the past.  Ultram for severe pain

## 2013-08-11 NOTE — Progress Notes (Signed)
  Subjective:    Patient ID: Hunter Pratt, male    DOB: 10/22/1956, 57 y.o.   MRN: [id number]  HPI  Patient here with right shoulder arm and hand pain for the past week. He has been out in his garden he has been looking some baskets of  tomatoes but states that they have not been very heavy. He denies any other injury. When he moves a certain way he will get sharp pain that starts at his shoulder blade and will sometimes run down to his arm occasionally he also has pain in his right wrist. 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 He denies any weakness in his upper extremities or neuropathy in his hands. The pain does keep him up at night. He has been taken a few Advil that he typically has reaction with Aleve.  Review of Systems   GEN- denies fatigue, fever, weight loss,weakness, recent illness HEENT- denies eye drainage, change in vision, nasal discharge, CVS- denies chest pain, palpitations RESP- denies SOB, cough, wheeze MSK- + joint pain, muscle aches, injury Neuro- denies headache, dizziness, syncope, seizure activity      Objective:   Physical Exam  GEN-NAD,alert and oriented x 3 Neck- Fair ROM, neg spurlings MSK- Bilateral shoulder normal inspection no winged scapula, rotator cuff intact bilat, biceps in tact   Mild TTP over medial right shoulder blade. Mild TTP lateral epicondyle. Right wrist NT, neg finkelstein's test.No joint swelling Skin- in tact, no rash Neuro- normal tone , motor equal bilat, sensation in tact      Assessment & Plan:

## 2013-08-11 NOTE — Patient Instructions (Signed)
Try 10mg  of the ambien Take the medrol dose pak  Stop the advil  Take ultram as needed for pain Call Monday if not improved

## 2013-08-11 NOTE — Assessment & Plan Note (Signed)
His CT scan from a few months ago which was done to look at his lymphadenopathy did note that his fusion was intact however he did have some worsening degenerative changes in the cervical region

## 2013-08-12 ENCOUNTER — Other Ambulatory Visit: Payer: Self-pay | Admitting: Family Medicine

## 2013-08-15 ENCOUNTER — Telehealth: Payer: Self-pay | Admitting: Family Medicine

## 2013-08-15 DIAGNOSIS — M792 Neuralgia and neuritis, unspecified: Secondary | ICD-10-CM

## 2013-08-15 DIAGNOSIS — M5030 Other cervical disc degeneration, unspecified cervical region: Secondary | ICD-10-CM

## 2013-08-15 NOTE — Telephone Encounter (Signed)
Pt said that he is not any better and that the prednisone is not helping. He wants to know if you can order a MRI or something like that.

## 2013-08-15 NOTE — Telephone Encounter (Signed)
MRI to be ordered, if we have any problems with insurance we will let him know

## 2013-08-16 NOTE — Telephone Encounter (Signed)
Pt aware of results 

## 2013-08-19 ENCOUNTER — Ambulatory Visit (HOSPITAL_COMMUNITY)
Admission: RE | Admit: 2013-08-19 | Discharge: 2013-08-19 | Disposition: A | Discharge status: Home / Self Care | Payer: Medicare Other | Source: Ambulatory Visit | Attending: Family Medicine | Admitting: Family Medicine

## 2013-08-19 DIAGNOSIS — IMO0002 Reserved for concepts with insufficient information to code with codable children: Secondary | ICD-10-CM | POA: Diagnosis not present

## 2013-08-19 DIAGNOSIS — M5030 Other cervical disc degeneration, unspecified cervical region: Secondary | ICD-10-CM | POA: Diagnosis not present

## 2013-08-19 DIAGNOSIS — M792 Neuralgia and neuritis, unspecified: Secondary | ICD-10-CM

## 2013-08-19 DIAGNOSIS — M4802 Spinal stenosis, cervical region: Secondary | ICD-10-CM | POA: Diagnosis not present

## 2013-08-19 DIAGNOSIS — M542 Cervicalgia: Secondary | ICD-10-CM | POA: Diagnosis not present

## 2013-08-20 ENCOUNTER — Other Ambulatory Visit: Payer: Self-pay | Admitting: Family Medicine

## 2013-08-20 DIAGNOSIS — M5030 Other cervical disc degeneration, unspecified cervical region: Secondary | ICD-10-CM

## 2013-08-20 DIAGNOSIS — M4802 Spinal stenosis, cervical region: Secondary | ICD-10-CM

## 2013-08-20 MED ORDER — GABAPENTIN 300 MG PO CAPS: 300.0000 mg | ORAL_CAPSULE | Freq: Every day | ORAL | Status: DC

## 2013-08-25 ENCOUNTER — Other Ambulatory Visit: Payer: Self-pay | Admitting: Family Medicine

## 2013-08-25 NOTE — Telephone Encounter (Signed)
Tramadol 50 mg

## 2013-08-26 ENCOUNTER — Other Ambulatory Visit: Payer: Self-pay | Admitting: Family Medicine

## 2013-08-26 MED ORDER — TRAMADOL HCL 50 MG PO TABS: 50.0000 mg | ORAL_TABLET | Freq: Four times a day (QID) | ORAL | Status: DC | PRN

## 2013-08-26 NOTE — Telephone Encounter (Signed)
rx called in  #30 per Dr Jeanice Lim

## 2013-08-26 NOTE — Telephone Encounter (Signed)
Okay to refill? 

## 2013-08-29 DIAGNOSIS — M47812 Spondylosis without myelopathy or radiculopathy, cervical region: Secondary | ICD-10-CM | POA: Diagnosis not present

## 2013-08-29 DIAGNOSIS — E663 Overweight: Secondary | ICD-10-CM | POA: Diagnosis not present

## 2013-09-12 ENCOUNTER — Other Ambulatory Visit: Payer: Self-pay | Admitting: Family Medicine

## 2013-09-13 ENCOUNTER — Ambulatory Visit (HOSPITAL_COMMUNITY)
Admission: RE | Admit: 2013-09-13 | Discharge: 2013-09-13 | Disposition: A | Discharge status: Home / Self Care | Payer: Medicare Other | Source: Ambulatory Visit | Attending: Neurosurgery | Admitting: Neurosurgery

## 2013-09-13 DIAGNOSIS — M79609 Pain in unspecified limb: Secondary | ICD-10-CM | POA: Diagnosis not present

## 2013-09-13 DIAGNOSIS — M25519 Pain in unspecified shoulder: Secondary | ICD-10-CM | POA: Insufficient documentation

## 2013-09-13 DIAGNOSIS — IMO0001 Reserved for inherently not codable concepts without codable children: Secondary | ICD-10-CM | POA: Diagnosis not present

## 2013-09-13 DIAGNOSIS — M25419 Effusion, unspecified shoulder: Secondary | ICD-10-CM | POA: Diagnosis not present

## 2013-09-13 DIAGNOSIS — I10 Essential (primary) hypertension: Secondary | ICD-10-CM | POA: Insufficient documentation

## 2013-09-13 DIAGNOSIS — Z21 Asymptomatic human immunodeficiency virus [HIV] infection status: Secondary | ICD-10-CM | POA: Insufficient documentation

## 2013-09-13 DIAGNOSIS — M4722 Other spondylosis with radiculopathy, cervical region: Secondary | ICD-10-CM

## 2013-09-13 DIAGNOSIS — M542 Cervicalgia: Secondary | ICD-10-CM | POA: Insufficient documentation

## 2013-09-13 MED ORDER — TRAMADOL HCL 50 MG PO TABS: ORAL_TABLET | ORAL | Status: DC

## 2013-09-13 NOTE — Telephone Encounter (Signed)
Okay to refill, give 3  

## 2013-09-13 NOTE — Telephone Encounter (Signed)
OK refill?  Last Rf 9/26 #30

## 2013-09-13 NOTE — Addendum Note (Signed)
Addended by: Donne Anon on: 09/13/2013 02:33 PM   Modules accepted: Orders

## 2013-09-13 NOTE — Telephone Encounter (Signed)
rx called in

## 2013-09-14 DIAGNOSIS — M4722 Other spondylosis with radiculopathy, cervical region: Secondary | ICD-10-CM | POA: Insufficient documentation

## 2013-09-14 NOTE — Evaluation (Signed)
Physical Therapy Evaluation  Patient Details  Name: Hunter Pratt MRN: [id number] Date of Birth: [birth year]  Today's Date: 09/13/2013 Time: 0935-1015 PT Time Calculation (min): 40 min Charges: 1 evaluation, 1 estim/ice             Visit#: 1 of 8  Re-eval: 10/13/13 Assessment Diagnosis: Cervical Spondylosis Next MD Visit: Dr. Franky Macho - Oct 28 Prior Therapy: in 1992 after his cervical fusion  Authorization: Medicare    Authorization Time Period:    Authorization Visit#: 1 of 10   Past Medical History:  Past Medical History  Diagnosis Date  . HIV infection   . Arthritis   . GERD (gastroesophageal reflux disease)   . Kidney stone   . PUD (peptic ulcer disease)   . DDD (degenerative disc disease), cervical   . OA (osteoarthritis) of knee   . Hearing loss     Bilateral  . Hyperlipidemia   . Systolic dysfunction     EF 40-45%   Past Surgical History:  Past Surgical History  Procedure Laterality Date  . Cervical discectomy  1990 and 1991    Subjective Symptoms/Limitations Symptoms: Pt is a 57 year old male is referred to PT for RUE pain and tenderness which started on September 7th when he was carrying a small bucket of tomatoes and then next morning it was bothering him to the point his having difficulty using his RUE, cannot place pressure on it, difficulty lifting it in the air, difficulty putting his clothes on.  Pertinent History: hx of C4-5 cervical fusion, hx of RUE pain in 2010 Patient Stated Goals: "Stop hurting" Pain Assessment Currently in Pain?: Yes Pain Score: 8  Pain Location: Arm Pain Orientation: Right Pain Type: Acute pain;Chronic pain Pain Frequency: Constant Pain Relieving Factors: pain medication , heat  Precautions/Restrictions  Precautions Precaution Comments: Cervical C4-5 Fusion  Prior Functioning  Prior Function Vocation: On disability  Cognition/Observation Observation/Other Assessments Observations: constant need to elevate RUE when  sitting due to radicular symptoms.   Sensation/Coordination/Flexibility/Functional Tests Sensation Light Touch: Impaired by gross assessment  Assessment RUE AROM (degrees) Right Shoulder Flexion: 170 Degrees Right Shoulder ABduction: 120 Degrees Right Shoulder Internal Rotation: 80 Degrees Right Shoulder External Rotation: 80 Degrees RUE Strength Right Shoulder Flexion: 4/5 Right Shoulder Extension: 4/5 Right Shoulder ABduction: 3+/5 Right Shoulder Internal Rotation: 4/5 Right Shoulder External Rotation: 4/5 Right Shoulder Horizontal ABduction: 3/5 Right Shoulder Horizontal ADduction: 3/5 Grip (lbs): 62 (Lt Grip: 58) Lateral Pinch: 18 lbs (Lt 20) 3 Point Pinch: 18 lbs (Lt 22) Palpation Palpation: significant pain and tenderness to scapular region (R>L) with moderate fascial restrictions.  Pain and tenderness to Rt upper and lower arm and dorsal hand.   Mobility/Balance  Posture/Postural Control Posture/Postural Control: Postural limitations Postural Limitations: rounded shoulders/upper cross sydrome, has difficulty mainitaining approprirate posture for 30 seconds while sitting.   Exercise/Treatments Modalities Modalities: Cryotherapy;Electrical Stimulation Cryotherapy Number Minutes Cryotherapy: 10 Minutes Cryotherapy Location: Shoulder Type of Cryotherapy: Ice pack Pharmacologist Location: Rt scapular region Statistician Action: Psychiatrist Parameters: 80/150 8 Volts x10 minautes Electrical Stimulation Goals: Pain  Physical Therapy Assessment and Plan PT Assessment and Plan Clinical Impression Statement: Hunter Pratt is a 57 year old male referred to PT for cervical spondylosis with radiculopathy to RUE with impairments listed below.  He has a signifcant hx of C4-5 fusion in 1990 and since then has been relaitivly pain free and has a new incidence of radicular symptoms which is likely  coming from myofascial pain.  Pt has signficant pain with mild palpation to RUE and scapular region.  Pt will benefit from skilled therapeutic intervention in order to improve on the following deficits: Pain;Decreased strength;Decreased range of motion;Increased fascial restricitons;Impaired UE functional use Rehab Potential: Good PT Frequency: Min 2X/week PT Duration: 6 weeks PT Treatment/Interventions: Therapeutic activities;Therapeutic exercise;Neuromuscular re-education;Patient/family education;Modalities;Manual techniques PT Plan: PLEASE HAVE PT COMPLETE FOTO!!!!!  F/u with estim and ice. Manual techniques to decrease pain and tenderness to Rt scapular region and UE.  Teach prone exercises for scapular strengthening, POE exercises, chin tucks, seated scap retraction.     Goals Home Exercise Program Pt/caregiver will Perform Home Exercise Program: Independently PT Goal: Perform Home Exercise Program - Progress: Goal set today PT Short Term Goals Time to Complete Short Term Goals: 3 weeks PT Short Term Goal 1: Pt will present with mild fascial restrictions and muscle spasms to his Rt scapular and UT report pain less than 5/10 for 50% of his day to his Rt UE. PT Short Term Goal 2: Pt will improve his Rt shoulder AROM flexion: 170, abduction 160 for greater use of RUE with functional movements.  PT Short Term Goal 3: Pt will improve his RUE strength to 4/5 throughout and improve RUE to 70lbs for greater ease with holding a cup.  PT Short Term Goal 4: Pt will be educated in proper posture to decrease radicular symptoms by 50%. PT Long Term Goals Time to Complete Long Term Goals:  (6 weeks) PT Long Term Goal 1: Pt will improve postural strength to WNL in order to maintain approprirate sitting and standing posture x10 minutes with min cueing.  PT Long Term Goal 2: Pt will improve his RUE strength to WNL in order to complete Rt shoulder and arm AROM to WNL in order to retrieve a dish from an upper  cabniet.  Long Term Goal 3: Pt will reduce his radicular symptoms in order to maintain in a seated position without the need to elevate his RUE.   Problem List Patient Active Problem List   Diagnosis Date Noted  . Cervical spondylosis with radiculopathy 09/14/2013  . Pain in joint, shoulder region 08/11/2013  . Wrist pain 08/11/2013  . DDD (degenerative disc disease), cervical 08/11/2013  . Grief reaction 08/03/2013  . HTN (hypertension) 07/27/2013  . Hematuria 05/03/2013  . Back pain 05/03/2013  . Enlarged lymph node 03/04/2013  . Eczematous dermatitis 11/14/2012  . Obesity 11/14/2012  . Systolic dysfunction 08/18/2012  . Glucose intolerance (impaired glucose tolerance) 07/30/2012  . Lower extremity edema 06/09/2012  . OSA (obstructive sleep apnea) 06/09/2012  . METHICILLIN RESISTANT STAPHYLOCOCCUS AUREUS INFECTION 10/04/2009  . DEGENERATIVE JOINT DISEASE, KNEES, BILATERAL 01/30/2009  . HYPERLIPIDEMIA 04/21/2008  . GERD 04/21/2008  . MOOD DISORDER IN CONDITIONS CLASSIFIED ELSEWHERE 02/28/2008  . PERIPHERAL NEUROPATHY 02/28/2008  . PUD 01/03/2008  . TOBACCO USER 09/29/2007  . HIV DISEASE 12/28/2006    PT Plan of Care PT Home Exercise Plan: given PT Patient Instructions: importance of proper posture, discussed modalities, importance of HEP Consulted and Agree with Plan of Care: Patient  GP Functional Assessment Tool Used: clinical observation: FOTO was not given at intial eval Functional Limitation: Carrying, moving and handling objects Carrying, Moving and Handling Objects Current Status (W0981): At least 60 percent but less than 80 percent impaired, limited or restricted Carrying, Moving and Handling Objects Goal Status [phone]): At least 40 percent but less than 60 percent impaired, limited or restricted  Donell Tomkins, MPT, ATC  09/14/2013, 8:57 AM  Physician Documentation Your signature is required to indicate approval of the treatment plan as stated above.  Please sign  and either send electronically or make a copy of this report for your files and return this physician signed original.   Please mark one 1.__approve of plan  2. ___approve of plan with the following conditions.   ______________________________                                                          _____________________ Physician Signature                                                                                                             Date

## 2013-09-15 ENCOUNTER — Ambulatory Visit (HOSPITAL_COMMUNITY)
Admission: RE | Admit: 2013-09-15 | Discharge: 2013-09-15 | Disposition: A | Discharge status: Home / Self Care | Payer: Medicare Other | Source: Ambulatory Visit | Attending: Family Medicine | Admitting: Family Medicine

## 2013-09-15 ENCOUNTER — Other Ambulatory Visit: Payer: Self-pay | Admitting: Licensed Clinical Social Worker

## 2013-09-15 DIAGNOSIS — M4722 Other spondylosis with radiculopathy, cervical region: Secondary | ICD-10-CM

## 2013-09-15 DIAGNOSIS — M79609 Pain in unspecified limb: Secondary | ICD-10-CM | POA: Diagnosis not present

## 2013-09-15 DIAGNOSIS — I10 Essential (primary) hypertension: Secondary | ICD-10-CM | POA: Diagnosis not present

## 2013-09-15 DIAGNOSIS — M25519 Pain in unspecified shoulder: Secondary | ICD-10-CM | POA: Diagnosis not present

## 2013-09-15 DIAGNOSIS — IMO0001 Reserved for inherently not codable concepts without codable children: Secondary | ICD-10-CM | POA: Diagnosis not present

## 2013-09-15 DIAGNOSIS — M542 Cervicalgia: Secondary | ICD-10-CM | POA: Diagnosis not present

## 2013-09-15 DIAGNOSIS — M25419 Effusion, unspecified shoulder: Secondary | ICD-10-CM | POA: Diagnosis not present

## 2013-09-15 DIAGNOSIS — E7800 Pure hypercholesterolemia, unspecified: Secondary | ICD-10-CM

## 2013-09-15 MED ORDER — PRAVASTATIN SODIUM 20 MG PO TABS: 20.0000 mg | ORAL_TABLET | Freq: Every day | ORAL | Status: DC

## 2013-09-15 NOTE — Progress Notes (Signed)
Physical Therapy Treatment Patient Details  Name: Hunter Pratt MRN: [id number] Date of Birth: 02/04/1956  Today's Date: 09/15/2013 Time: 0800-0850 PT Time Calculation (min): 50 min Charge: TE 4098-1191, Manual [phone], Estim [phone]  Visit#: 2 of 8  Re-eval: 10/13/13 Assessment Diagnosis: Cervical Spondylosis Next MD Visit: Dr. Franky Macho - Oct 28 Prior Therapy: in 1992 after his cervical fusion  Authorization: Medicare  Authorization Time Period:    Authorization Visit#: 2 of 10   Subjective: Symptoms/Limitations Symptoms: Pt stated ice has been helping.  Reports increased pain down Rt UE with shoulder flexion and while sleeping.  Pain scale 6-7/10 today. Pain Assessment Currently in Pain?: Yes Pain Score: 7  Pain Location: Arm Pain Orientation: Right  Precautions/Restrictions  Precautions Precaution Comments: Cervical C4-5 Fusion  Exercise/Treatments Seated Exercises Other Seated Exercise: scapular retraction 10x 5" with tactlie cueing for correct musculature activation Prone Exercises Other Prone Exercise: POE x 2 minutes, POE exercises: cervical rotation 5x 5", sidebend 5x 5", serratus anterior 10x 5" with tactile cueing, chin tucks 10x 5"  Modalities Modalities: Electrical Stimulation;Cryotherapy Manual Therapy Manual Therapy: Myofascial release Myofascial Release: Sca[place]ar region 4696-2952 Cryotherapy Number Minutes Cryotherapy: 15 Minutes Cryotherapy Location: Shoulder Type of Cryotherapy: Ice pack Pharmacologist Location: Rt scapular region Statistician Action: Psychiatrist Parameters: 80/150 9.6 Volts x15 minautes Electrical Stimulation Goals: Pain  Physical Therapy Assessment and Plan PT Assessment and Plan Clinical Impression Statement: Began treatment for scapular strengthening, awareness of posture and manual techniques to reduce pain and tenderness.  Ended  session with estim and ice for pain control.  Pt required tactile cueing to activate and utilize correct muscluature with exercises.  Pt stated no scapular pain end of session and decreased radicualr symptoms Rt UE.  FOTO complete with status 34%, limitation 66%. PT Plan: F/u with estim and ice. Manual techniques to decrease pain and tenderness to Rt scapular region and UE.  Teach prone exercises for scapular strengthening, POE exercises, chin tucks, seated scap retraction.     Goals Home Exercise Program Pt/caregiver will Perform Home Exercise Program: Independently PT Short Term Goals Time to Complete Short Term Goals: 3 weeks PT Short Term Goal 1: Pt will present with mild fascial restrictions and muscle spasms to his Rt scapular and UT report pain less than 5/10 for 50% of his day to his Rt UE. PT Short Term Goal 1 - Progress: Progressing toward goal PT Short Term Goal 2: Pt will improve his Rt shoulder AROM flexion: 170, abduction 160 for greater use of RUE with functional movements.  PT Short Term Goal 3: Pt will improve his RUE strength to 4/5 throughout and improve RUE to 70lbs for greater ease with holding a cup.  PT Short Term Goal 3 - Progress: Progressing toward goal PT Short Term Goal 4: Pt will be educated in proper posture to decrease radicular symptoms by 50%. PT Short Term Goal 4 - Progress: Progressing toward goal PT Long Term Goals Time to Complete Long Term Goals:  (6 weeks) PT Long Term Goal 1: Pt will improve postural strength to WNL in order to maintain approprirate sitting and standing posture x10 minutes with min cueing.  PT Long Term Goal 1 - Progress: Progressing toward goal PT Long Term Goal 2: Pt will improve his RUE strength to WNL in order to complete Rt shoulder and arm AROM to WNL in order to retrieve a dish from an upper cabniet.  Long Term Goal 3: Pt will reduce his  radicular symptoms in order to maintain in a seated position without the need to elevate his RUE.   Long Term Goal 3 Progress: Progressing toward goal  Problem List Patient Active Problem List   Diagnosis Date Noted  . Cervical spondylosis with radiculopathy 09/14/2013  . Pain in joint, shoulder region 08/11/2013  . Wrist pain 08/11/2013  . DDD (degenerative disc disease), cervical 08/11/2013  . Grief reaction 08/03/2013  . HTN (hypertension) 07/27/2013  . Hematuria 05/03/2013  . Back pain 05/03/2013  . Enlarged lymph node 03/04/2013  . Eczematous dermatitis 11/14/2012  . Obesity 11/14/2012  . Systolic dysfunction 08/18/2012  . Glucose intolerance (impaired glucose tolerance) 07/30/2012  . Lower extremity edema 06/09/2012  . OSA (obstructive sleep apnea) 06/09/2012  . METHICILLIN RESISTANT STAPHYLOCOCCUS AUREUS INFECTION 10/04/2009  . DEGENERATIVE JOINT DISEASE, KNEES, BILATERAL 01/30/2009  . HYPERLIPIDEMIA 04/21/2008  . GERD 04/21/2008  . MOOD DISORDER IN CONDITIONS CLASSIFIED ELSEWHERE 02/28/2008  . PERIPHERAL NEUROPATHY 02/28/2008  . PUD 01/03/2008  . TOBACCO USER 09/29/2007  . HIV DISEASE 12/28/2006    PT - End of Session Activity Tolerance: Patient tolerated treatment well General Behavior During Therapy: Hunter Pratt for tasks assessed/performed  GP    Hunter Pratt 09/15/2013, 8:56 AM

## 2013-09-20 ENCOUNTER — Other Ambulatory Visit: Payer: Self-pay | Admitting: Licensed Clinical Social Worker

## 2013-09-20 ENCOUNTER — Ambulatory Visit (HOSPITAL_COMMUNITY)
Admission: RE | Admit: 2013-09-20 | Discharge: 2013-09-20 | Disposition: A | Discharge status: Home / Self Care | Payer: Medicare Other | Source: Ambulatory Visit | Attending: Family Medicine | Admitting: Family Medicine

## 2013-09-20 DIAGNOSIS — M25519 Pain in unspecified shoulder: Secondary | ICD-10-CM | POA: Diagnosis not present

## 2013-09-20 DIAGNOSIS — M25419 Effusion, unspecified shoulder: Secondary | ICD-10-CM | POA: Diagnosis not present

## 2013-09-20 DIAGNOSIS — M79609 Pain in unspecified limb: Secondary | ICD-10-CM | POA: Diagnosis not present

## 2013-09-20 DIAGNOSIS — M4722 Other spondylosis with radiculopathy, cervical region: Secondary | ICD-10-CM

## 2013-09-20 DIAGNOSIS — E7800 Pure hypercholesterolemia, unspecified: Secondary | ICD-10-CM

## 2013-09-20 DIAGNOSIS — IMO0001 Reserved for inherently not codable concepts without codable children: Secondary | ICD-10-CM | POA: Diagnosis not present

## 2013-09-20 DIAGNOSIS — I10 Essential (primary) hypertension: Secondary | ICD-10-CM | POA: Diagnosis not present

## 2013-09-20 DIAGNOSIS — M542 Cervicalgia: Secondary | ICD-10-CM | POA: Diagnosis not present

## 2013-09-20 MED ORDER — PRAVASTATIN SODIUM 20 MG PO TABS: 20.0000 mg | ORAL_TABLET | Freq: Every day | ORAL | Status: DC

## 2013-09-20 NOTE — Progress Notes (Addendum)
Physical Therapy Treatment Patient Details  Name: Hunter Pratt MRN: [id number] Date of Birth: December 18, 1955  Today's Date: 09/20/2013 Time: 0802-0853 PT Time Calculation (min): 51 min Charge: TE 7829-5621, Manual [phone], Estim, [phone], ice x 1  Visit#: 3 of 8  Re-eval: 10/13/13 Assessment Diagnosis: Cervical Spondylosis Next MD Visit: Dr. Franky Macho - Oct 28 Prior Therapy: in 1992 after his cervical fusion  Authorization: Medicare  Authorization Time Period:    Authorization Visit#: 3 of 10   Subjective: Symptoms/Limitations Symptoms: Pt stated estim helped with his pain for a couple of days,  pain scale 4/10 today Pain Assessment Currently in Pain?: Yes Pain Score: 4  Pain Location: Arm Pain Orientation: Right  Precautions/Restrictions  Precautions Precaution Comments: Cervical C4-5 Fusion  Exercise/Treatments Stretches Upper Trapezius Stretch: 1 rep;30 seconds Other Neck Stretches: anterior capsule st 1x 30" Other Neck Stretches: wrist extension stretch 1x 30" Seated Exercises Other Seated Exercise: scapular retraction 10x 5" with tactlie cueing for correct technique Prone Exercises Shoulder Extension: 10 reps Rows: 10 reps Other Prone Exercise: POE x 2 minutes, POE exercises: cervical rotation 5x 5", sidebend 5x 5", serratus anterior 10x 5" with tactile cueing, chin tucks 10x 5"  Modalities Modalities: Electrical Stimulation;Cryotherapy Manual Therapy Manual Therapy: Myofascial release Myofascial Release: Upper trapezius and Rt scapular region 4132-4401 Cryotherapy Number Minutes Cryotherapy: 15 Minutes Cryotherapy Location: Shoulder Type of Cryotherapy: Ice pack Pharmacologist Location: Rt scapular region Statistician Action: Psychiatrist Parameters: hi/lo sweep IFC 80/150 13 volts x 15 minutes Electrical Stimulation Goals: Pain  Physical Therapy Assessment and  Plan PT Assessment and Plan Clinical Impression Statement: Progressed to prone shoulder and postural strengthening exercises with verbal and tactile cueing for correct technique and proper musculature activation.  Added stretches to improve Rt UE flexibility and reduce radicular symptoms.  Manual techniques complete to reduce fascial restrictions in Rt scapular region and ended session with interferestial estim and ice for pain control.   PT Plan: F/u with estim and ice. Manual techniques to decrease pain and tenderness to Rt scapular region and UE.  Teach prone exercises for scapular strengthening, POE exercises, chin tucks, seated scap retraction.     Goals PT Short Term Goals PT Short Term Goal 1 - Progress: Progressing toward goal PT Short Term Goal 2 - Progress: Progressing toward goal PT Short Term Goal 3 - Progress: Progressing toward goal PT Short Term Goal 4 - Progress: Progressing toward goal  Problem List Patient Active Problem List   Diagnosis Date Noted  . Cervical spondylosis with radiculopathy 09/14/2013  . Pain in joint, shoulder region 08/11/2013  . Wrist pain 08/11/2013  . DDD (degenerative disc disease), cervical 08/11/2013  . Grief reaction 08/03/2013  . HTN (hypertension) 07/27/2013  . Hematuria 05/03/2013  . Back pain 05/03/2013  . Enlarged lymph node 03/04/2013  . Eczematous dermatitis 11/14/2012  . Obesity 11/14/2012  . Systolic dysfunction 08/18/2012  . Glucose intolerance (impaired glucose tolerance) 07/30/2012  . Lower extremity edema 06/09/2012  . OSA (obstructive sleep apnea) 06/09/2012  . METHICILLIN RESISTANT STAPHYLOCOCCUS AUREUS INFECTION 10/04/2009  . DEGENERATIVE JOINT DISEASE, KNEES, BILATERAL 01/30/2009  . HYPERLIPIDEMIA 04/21/2008  . GERD 04/21/2008  . MOOD DISORDER IN CONDITIONS CLASSIFIED ELSEWHERE 02/28/2008  . PERIPHERAL NEUROPATHY 02/28/2008  . PUD 01/03/2008  . TOBACCO USER 09/29/2007  . HIV DISEASE 12/28/2006    PT - End of  Session Activity Tolerance: Patient tolerated treatment well General Behavior During Therapy: [facility] for tasks assessed/performed  GP  [name] 09/20/2013, 8:46 AM

## 2013-09-22 ENCOUNTER — Ambulatory Visit (HOSPITAL_COMMUNITY)
Admission: RE | Admit: 2013-09-22 | Discharge: 2013-09-22 | Disposition: A | Discharge status: Home / Self Care | Payer: Medicare Other | Source: Ambulatory Visit | Attending: Family Medicine | Admitting: Family Medicine

## 2013-09-22 DIAGNOSIS — IMO0001 Reserved for inherently not codable concepts without codable children: Secondary | ICD-10-CM | POA: Diagnosis not present

## 2013-09-22 DIAGNOSIS — M542 Cervicalgia: Secondary | ICD-10-CM | POA: Diagnosis not present

## 2013-09-22 DIAGNOSIS — M79609 Pain in unspecified limb: Secondary | ICD-10-CM | POA: Diagnosis not present

## 2013-09-22 DIAGNOSIS — M25419 Effusion, unspecified shoulder: Secondary | ICD-10-CM | POA: Diagnosis not present

## 2013-09-22 DIAGNOSIS — M25519 Pain in unspecified shoulder: Secondary | ICD-10-CM | POA: Diagnosis not present

## 2013-09-22 DIAGNOSIS — I10 Essential (primary) hypertension: Secondary | ICD-10-CM | POA: Diagnosis not present

## 2013-09-22 DIAGNOSIS — M4722 Other spondylosis with radiculopathy, cervical region: Secondary | ICD-10-CM

## 2013-09-22 NOTE — Progress Notes (Deleted)
Physical Therapy Treatment Patient Details  Name: Hunter Pratt MRN: [id number] Date of Birth: 01-19-1956  Today's Date: 09/22/2013 Time: 4098-1191 PT Time Calculation (min): 54 min Charge: Therex 4782-9562, Manual [phone], Estim [phone]  Visit#: 4 of 8  Re-eval: 10/13/13 Assessment Diagnosis: Cervical Spondylosis Next MD Visit: Dr. Franky Macho - Oct 28 Prior Therapy: in 1992 after his cervical fusion  Authorization: Medicare  Authorization Time Period:    Authorization Visit#: 4 of 10   Subjective: Symptoms/Limitations Symptoms: Pt stated he had bad day yesterday, believes related to the weather.  Pt stated current pain scale 5/10 with radiating sensation Pain Assessment Currently in Pain?: Yes Pain Score: 4  Pain Location: Arm Pain Orientation: Right  Precautions/Restrictions  Precautions Precaution Comments: Cervical C4-5 Fusion  Exercise/Treatments Stretches Upper Trapezius Stretch: 1 rep;30 seconds Other Neck Stretches: wrist extension stretch 2x 30", wrist flexion strect 2x 30" Seated Exercises W Back: 10 reps Prone Exercises Shoulder Extension: 10 reps Rows: 10 reps Other Prone Exercise: POE x 2 minutes, POE exercises: cervical rotation 5x 5", sidebend 5x 5", serratus anterior 10x 5" with tactile cueing, chin tucks 10x 5" Other Prone Exercise: shoulder flexion 10x  Modalities Modalities: Electrical Stimulation;Cryotherapy Manual Therapy Manual Therapy: Myofascial release Myofascial Release: Trapezius and Rt scapular region to reduce fascial restrictions in prone Cryotherapy Number Minutes Cryotherapy: 15 Minutes Cryotherapy Location: Shoulder Type of Cryotherapy: Ice pack Pharmacologist Location: Rt scapular region Statistician Action: Psychiatrist Parameters: hi/lo sweep IFC 80/150 13 volts x 15 minutes Electrical Stimulation Goals: Pain  Physical Therapy  Assessment and Plan PT Assessment and Plan Clinical Impression Statement: Session focus on manual techniques to reduce fascial restrictions Rt scapular region and education on proper posture and exercises to reduce radicular symptoms.  Pt reported decrease in radicular symptoms following forearm stretches.  Ended session with estim and ice for pain control.   PT Plan: Manual techniques to decrease pain and tenderness to Rt scapular region and UE.  Teach prone exercises for scapular strengthening, POE exercises, chin tucks, seated scap retraction.     Goals Home Exercise Program Pt/caregiver will Perform Home Exercise Program: Independently PT Short Term Goals Time to Complete Short Term Goals: 3 weeks PT Short Term Goal 1: Pt will present with mild fascial restrictions and muscle spasms to his Rt scapular and UT report pain less than 5/10 for 50% of his day to his Rt UE. PT Short Term Goal 2: Pt will improve his Rt shoulder AROM flexion: 170, abduction 160 for greater use of RUE with functional movements.  PT Short Term Goal 3: Pt will improve his RUE strength to 4/5 throughout and improve RUE to 70lbs for greater ease with holding a cup.  PT Short Term Goal 3 - Progress: Progressing toward goal PT Short Term Goal 4: Pt will be educated in proper posture to decrease radicular symptoms by 50%. PT Long Term Goals Time to Complete Long Term Goals:  (6 weeks) PT Long Term Goal 1: Pt will improve postural strength to WNL in order to maintain approprirate sitting and standing posture x10 minutes with min cueing.  PT Long Term Goal 1 - Progress: Progressing toward goal PT Long Term Goal 2: Pt will improve his RUE strength to WNL in order to complete Rt shoulder and arm AROM to WNL in order to retrieve a dish from an upper cabniet.  Long Term Goal 3: Pt will reduce his radicular symptoms in order to maintain in a seated  position without the need to elevate his RUE.   Problem List Patient Active  Problem List   Diagnosis Date Noted  . Cervical spondylosis with radiculopathy 09/14/2013  . Pain in joint, shoulder region 08/11/2013  . Wrist pain 08/11/2013  . DDD (degenerative disc disease), cervical 08/11/2013  . Grief reaction 08/03/2013  . HTN (hypertension) 07/27/2013  . Hematuria 05/03/2013  . Back pain 05/03/2013  . Enlarged lymph node 03/04/2013  . Eczematous dermatitis 11/14/2012  . Obesity 11/14/2012  . Systolic dysfunction 08/18/2012  . Glucose intolerance (impaired glucose tolerance) 07/30/2012  . Lower extremity edema 06/09/2012  . OSA (obstructive sleep apnea) 06/09/2012  . METHICILLIN RESISTANT STAPHYLOCOCCUS AUREUS INFECTION 10/04/2009  . DEGENERATIVE JOINT DISEASE, KNEES, BILATERAL 01/30/2009  . HYPERLIPIDEMIA 04/21/2008  . GERD 04/21/2008  . MOOD DISORDER IN CONDITIONS CLASSIFIED ELSEWHERE 02/28/2008  . PERIPHERAL NEUROPATHY 02/28/2008  . PUD 01/03/2008  . TOBACCO USER 09/29/2007  . HIV DISEASE 12/28/2006    PT - End of Session Activity Tolerance: Patient tolerated treatment well General Behavior During Therapy: [facility] for tasks assessed/performed  GP    [name] 09/22/2013, 12:23 PM

## 2013-09-22 NOTE — Progress Notes (Signed)
Physical Therapy Treatment Patient Details  Name: Hunter Pratt MRN: [id number] Date of Birth: 08-16-1956  Today's Date: 09/22/2013 Time: 4098-1191 PT Time Calculation (min): 54 min Charge: Therex 4782-9562, Manual [phone], Estim [phone]  Visit#: 4 of 8  Re-eval: 10/13/13 Assessment Diagnosis: Cervical Spondylosis Next MD Visit: Dr. Franky Macho - Oct 28 Prior Therapy: in 1992 after his cervical fusion  Authorization: Medicare  Authorization Time Period:    Authorization Visit#: 4 of 10   Subjective: Symptoms/Limitations Symptoms: Pt stated he had bad day yesterday, believes related to the weather.  Pt stated current pain scale 5/10 with radiating sensation Pain Assessment Currently in Pain?: Yes Pain Score: 4  Pain Location: Arm Pain Orientation: Right  Precautions/Restrictions  Precautions Precaution Comments: Cervical C4-5 Fusion  Exercise/Treatments Stretches Upper Trapezius Stretch: 1 rep;30 seconds Other Neck Stretches: wrist extension stretch 2x 30", wrist flexion strect 2x 30" Seated Exercises W Back: 10 reps Prone Exercises Shoulder Extension: 10 reps Rows: 10 reps Other Prone Exercise: POE x 2 minutes, POE exercises: cervical rotation 5x 5", sidebend 5x 5", serratus anterior 10x 5" with tactile cueing, chin tucks 10x 5" Other Prone Exercise: shoulder flexion 10x  Modalities Modalities: Electrical Stimulation;Cryotherapy Manual Therapy Manual Therapy: Myofascial release Myofascial Release: Trapezius and Rt scapular region to reduce fascial restrictions in prone Cryotherapy Number Minutes Cryotherapy: 15 Minutes Cryotherapy Location: Shoulder Type of Cryotherapy: Ice pack Pharmacologist Location: Rt scapular region Statistician Action: Psychiatrist Parameters: hi/lo sweep IFC 80/150 13 volts x 15 minutes Electrical Stimulation Goals: Pain  Physical Therapy  Assessment and Plan PT Assessment and Plan Clinical Impression Statement: Session focus on manual techniques to reduce fascial restrictions Rt scapular region and education on proper posture and exercises to reduce radicular symptoms.  Pt reported decrease in radicular symptoms following forearm stretches.  Ended session with estim and ice for pain control.   PT Plan: Manual techniques to decrease pain and tenderness to Rt scapular region and UE.  Teach prone exercises for scapular strengthening, POE exercises, chin tucks, seated scap retraction.     Goals Home Exercise Program Pt/caregiver will Perform Home Exercise Program: Independently PT Short Term Goals Time to Complete Short Term Goals: 3 weeks PT Short Term Goal 1: Pt will present with mild fascial restrictions and muscle spasms to his Rt scapular and UT report pain less than 5/10 for 50% of his day to his Rt UE. PT Short Term Goal 2: Pt will improve his Rt shoulder AROM flexion: 170, abduction 160 for greater use of RUE with functional movements.  PT Short Term Goal 3: Pt will improve his RUE strength to 4/5 throughout and improve RUE to 70lbs for greater ease with holding a cup.  PT Short Term Goal 3 - Progress: Progressing toward goal PT Short Term Goal 4: Pt will be educated in proper posture to decrease radicular symptoms by 50%. PT Long Term Goals Time to Complete Long Term Goals:  (6 weeks) PT Long Term Goal 1: Pt will improve postural strength to WNL in order to maintain approprirate sitting and standing posture x10 minutes with min cueing.  PT Long Term Goal 1 - Progress: Progressing toward goal PT Long Term Goal 2: Pt will improve his RUE strength to WNL in order to complete Rt shoulder and arm AROM to WNL in order to retrieve a dish from an upper cabniet.  Long Term Goal 3: Pt will reduce his radicular symptoms in order to maintain in a seated  position without the need to elevate his RUE.   Problem List Patient Active  Problem List   Diagnosis Date Noted  . Cervical spondylosis with radiculopathy 09/14/2013  . Pain in joint, shoulder region 08/11/2013  . Wrist pain 08/11/2013  . DDD (degenerative disc disease), cervical 08/11/2013  . Grief reaction 08/03/2013  . HTN (hypertension) 07/27/2013  . Hematuria 05/03/2013  . Back pain 05/03/2013  . Enlarged lymph node 03/04/2013  . Eczematous dermatitis 11/14/2012  . Obesity 11/14/2012  . Systolic dysfunction 08/18/2012  . Glucose intolerance (impaired glucose tolerance) 07/30/2012  . Lower extremity edema 06/09/2012  . OSA (obstructive sleep apnea) 06/09/2012  . METHICILLIN RESISTANT STAPHYLOCOCCUS AUREUS INFECTION 10/04/2009  . DEGENERATIVE JOINT DISEASE, KNEES, BILATERAL 01/30/2009  . HYPERLIPIDEMIA 04/21/2008  . GERD 04/21/2008  . MOOD DISORDER IN CONDITIONS CLASSIFIED ELSEWHERE 02/28/2008  . PERIPHERAL NEUROPATHY 02/28/2008  . PUD 01/03/2008  . TOBACCO USER 09/29/2007  . HIV DISEASE 12/28/2006    PT - End of Session Activity Tolerance: Patient tolerated treatment well General Behavior During Therapy: [facility] for tasks assessed/performed  GP    [name] 09/22/2013, 12:29 PM

## 2013-09-27 ENCOUNTER — Ambulatory Visit (HOSPITAL_COMMUNITY)
Admission: RE | Admit: 2013-09-27 | Discharge: 2013-09-27 | Disposition: A | Discharge status: Home / Self Care | Payer: Medicare Other | Source: Ambulatory Visit | Attending: Family Medicine | Admitting: Family Medicine

## 2013-09-27 DIAGNOSIS — E663 Overweight: Secondary | ICD-10-CM | POA: Diagnosis not present

## 2013-09-27 DIAGNOSIS — M79609 Pain in unspecified limb: Secondary | ICD-10-CM | POA: Diagnosis not present

## 2013-09-27 DIAGNOSIS — M25519 Pain in unspecified shoulder: Secondary | ICD-10-CM | POA: Diagnosis not present

## 2013-09-27 DIAGNOSIS — M542 Cervicalgia: Secondary | ICD-10-CM | POA: Diagnosis not present

## 2013-09-27 DIAGNOSIS — IMO0001 Reserved for inherently not codable concepts without codable children: Secondary | ICD-10-CM | POA: Diagnosis not present

## 2013-09-27 DIAGNOSIS — I10 Essential (primary) hypertension: Secondary | ICD-10-CM | POA: Diagnosis not present

## 2013-09-27 DIAGNOSIS — M25419 Effusion, unspecified shoulder: Secondary | ICD-10-CM | POA: Diagnosis not present

## 2013-09-27 DIAGNOSIS — M47812 Spondylosis without myelopathy or radiculopathy, cervical region: Secondary | ICD-10-CM | POA: Diagnosis not present

## 2013-09-27 NOTE — Progress Notes (Signed)
Physical Therapy Treatment Patient Details  Name: Hunter Pratt MRN: [id number] Date of Birth: [birth year]  Today's Date: 09/27/2013 Time: 1300-1355 PT Time Calculation (min): 55 min  Visit#: 5 of 8  Re-eval: 10/13/13 Authorization: Medicare  Authorization Visit#: 5 of 10  Charges:  therex 1300-1325 (25'), manual 1326-1336 (10'), IFES/ice 1340-1355 (15')  Subjective: Symptoms/Limitations Symptoms: Pt states he went back to the doctor yesterday and he wants him to continue therapy.  States he returns in 2 months and if not better may begin injections.  Pt reports currently pain is 4/10 and mostly into Rt shoulder and into hand.  Pt states his Rt hand is numb today. Pain Assessment Currently in Pain?: Yes Pain Score: 4  Pain Location: Shoulder Pain Orientation: Right Pain Radiating Towards: Rt elbow and hand   Exercise/Treatments Seated Flexion: 10 reps Abduction: 10 reps Other Seated Exercises: W backs 10 reps Other Seated Exercises: wrist ext/ flexion stretch 2X30" Prone  Retraction: 10 reps Flexion: 10 reps Extension: 10 reps Horizontal ABduction 1: 10 reps Other Prone Exercises: protraction/retraction 10 reps Other Prone Exercises: POE with chin tucks, cervical  rotation, lateral flexion 10 reps each ROM / Strengthening / Isometric Strengthening UBE (Upper Arm Bike): 4' 2'fwd, 2'backward     Modalities Modalities: Electrical Stimulation;Cryotherapy Manual Therapy Manual Therapy: Myofascial release Myofascial Release: Trapezius and Rt scapular region to reduce fascial restrictions in prone Cryotherapy Number Minutes Cryotherapy: 15 Minutes Cryotherapy Location: Shoulder Type of Cryotherapy: Ice pack Pharmacologist Location: Rt scapular region Statistician Action: IFES, hi low sweep to reduce pain and radiculopathy Electrical Stimulation Parameters: hi/lo sweep 15 minutes at 12 V Electrical Stimulation Goals:  Pain  Physical Therapy Assessment and Plan PT Assessment and Plan Clinical Impression Statement: continued focus on reducing pain and radicupathy while increasing scapular and postural strength.  Added prone horizontal abd and seated flexion/abduction with vc's for exercise speed and control.   Unable to do double UE flexion in prone with good form without substitutuion but able to complete single arm raises. Estim and ice added at end to help reduce pain. Several large spasms in Rt scapular region, trigger point in upper trap which radiated to cervical region.  Able to decrease all 75% with manual techniques. PT Plan: Continue Manual techniques to decrease pain and tenderness to Rt scapular region and UE.  Continue to educate on posture and increase  scapular strength.     Problem List Patient Active Problem List   Diagnosis Date Noted  . Cervical spondylosis with radiculopathy 09/14/2013  . Pain in joint, shoulder region 08/11/2013  . Wrist pain 08/11/2013  . DDD (degenerative disc disease), cervical 08/11/2013  . Grief reaction 08/03/2013  . HTN (hypertension) 07/27/2013  . Hematuria 05/03/2013  . Back pain 05/03/2013  . Enlarged lymph node 03/04/2013  . Eczematous dermatitis 11/14/2012  . Obesity 11/14/2012  . Systolic dysfunction 08/18/2012  . Glucose intolerance (impaired glucose tolerance) 07/30/2012  . Lower extremity edema 06/09/2012  . OSA (obstructive sleep apnea) 06/09/2012  . METHICILLIN RESISTANT STAPHYLOCOCCUS AUREUS INFECTION 10/04/2009  . DEGENERATIVE JOINT DISEASE, KNEES, BILATERAL 01/30/2009  . HYPERLIPIDEMIA 04/21/2008  . GERD 04/21/2008  . MOOD DISORDER IN CONDITIONS CLASSIFIED ELSEWHERE 02/28/2008  . PERIPHERAL NEUROPATHY 02/28/2008  . PUD 01/03/2008  . TOBACCO USER 09/29/2007  . HIV DISEASE 12/28/2006    PT - End of Session Activity Tolerance: Patient tolerated treatment well General Behavior During Therapy: WFL for tasks assessed/performed   Lurena Nida, PTA/CLT 09/27/2013, 1:28  PM

## 2013-09-29 ENCOUNTER — Ambulatory Visit (HOSPITAL_COMMUNITY)
Admission: RE | Admit: 2013-09-29 | Discharge: 2013-09-29 | Disposition: A | Discharge status: Home / Self Care | Payer: Medicare Other | Source: Ambulatory Visit | Attending: Family Medicine | Admitting: Family Medicine

## 2013-09-29 DIAGNOSIS — M79609 Pain in unspecified limb: Secondary | ICD-10-CM | POA: Diagnosis not present

## 2013-09-29 DIAGNOSIS — M25519 Pain in unspecified shoulder: Secondary | ICD-10-CM | POA: Diagnosis not present

## 2013-09-29 DIAGNOSIS — I10 Essential (primary) hypertension: Secondary | ICD-10-CM | POA: Diagnosis not present

## 2013-09-29 DIAGNOSIS — M25419 Effusion, unspecified shoulder: Secondary | ICD-10-CM | POA: Diagnosis not present

## 2013-09-29 DIAGNOSIS — IMO0001 Reserved for inherently not codable concepts without codable children: Secondary | ICD-10-CM | POA: Diagnosis not present

## 2013-09-29 DIAGNOSIS — M542 Cervicalgia: Secondary | ICD-10-CM | POA: Diagnosis not present

## 2013-09-29 NOTE — Progress Notes (Signed)
Physical Therapy Treatment Patient Details  Name: Hunter Pratt MRN: [id number] Date of Birth: [birth year]  Today's Date: 09/29/2013 Time: 1030-1125 PT Time Calculation (min): 55 min Visit#: 6 of 8  Re-eval: 10/13/13 Authorization: Medicare  Authorization Visit#: 6 of 10  Charges:  therex 25' 1030-1055, manual 10', [id number], estim/ice 15' 1108-1123  Subjective: Symptoms/Limitations Symptoms: Pt 15 late for appt today.  states currently without pain or numbness. Pain Assessment Currently in Pain?: No/denies   Exercise/Treatments Prone  Retraction: 15 reps Flexion: 10 reps Extension: 15 reps Horizontal ABduction 1: 10 reps Other Prone Exercises: POE with chin tucks, cervical  rotation, lateral flexion 10 reps each Standing Extension: 10 reps;Theraband Theraband Level (Shoulder Extension): Level 3 (Green) Row: 10 reps;Theraband Theraband Level (Shoulder Row): Level 3 (Green) Retraction: 10 reps;Theraband Theraband Level (Shoulder Retraction): Level 3 (Green) ROM / Strengthening / Isometric Strengthening UBE (Upper Arm Bike): 4' 2'fwd, 2'backward      Modalities Modalities: Electrical Stimulation;Cryotherapy Manual Therapy Manual Therapy: Myofascial release Myofascial Release: Trap and Rt scap region to reduce fascial restrictions in prone Cryotherapy Number Minutes Cryotherapy: 15 Minutes Cryotherapy Location: Shoulder Electrical Stimulation Electrical Stimulation Location: Rt scapular region Electrical Stimulation Action: IFES, hi low sweep to reduce pain and radiculopathy Electrical Stimulation Parameters: hi/low sweep 15 minutes at 15Volts in supine Electrical Stimulation Goals: Pain  Physical Therapy Assessment and Plan PT Assessment and Plan Clinical Impression Statement: Unable to complete all exercises today due to pt being late.  Overall improving with less pain and increased strength in scapular region.  Able to complete prone exercises today without  having mm cramps.  Added postural theraband exercises today. PT Plan: Continue Manual techniques to decrease pain and tenderness to Rt scapular region and UE.  Continue to educate on posture and increase  scapular strength.  Give theraband and written instructions for HEP next visit.     Goals    Problem List Patient Active Problem List   Diagnosis Date Noted  . Cervical spondylosis with radiculopathy 09/14/2013  . Pain in joint, shoulder region 08/11/2013  . Wrist pain 08/11/2013  . DDD (degenerative disc disease), cervical 08/11/2013  . Grief reaction 08/03/2013  . HTN (hypertension) 07/27/2013  . Hematuria 05/03/2013  . Back pain 05/03/2013  . Enlarged lymph node 03/04/2013  . Eczematous dermatitis 11/14/2012  . Obesity 11/14/2012  . Systolic dysfunction 08/18/2012  . Glucose intolerance (impaired glucose tolerance) 07/30/2012  . Lower extremity edema 06/09/2012  . OSA (obstructive sleep apnea) 06/09/2012  . METHICILLIN RESISTANT STAPHYLOCOCCUS AUREUS INFECTION 10/04/2009  . DEGENERATIVE JOINT DISEASE, KNEES, BILATERAL 01/30/2009  . HYPERLIPIDEMIA 04/21/2008  . GERD 04/21/2008  . MOOD DISORDER IN CONDITIONS CLASSIFIED ELSEWHERE 02/28/2008  . PERIPHERAL NEUROPATHY 02/28/2008  . PUD 01/03/2008  . TOBACCO USER 09/29/2007  . HIV DISEASE 12/28/2006    PT - End of Session Activity Tolerance: Patient tolerated treatment well General Behavior During Therapy: WFL for tasks assessed/performed   Lurena Nida, PTA/CLT 09/29/2013, 11:09 AM

## 2013-10-04 ENCOUNTER — Ambulatory Visit (HOSPITAL_COMMUNITY)
Admission: RE | Admit: 2013-10-04 | Discharge: 2013-10-04 | Disposition: A | Discharge status: Home / Self Care | Payer: Medicare Other | Source: Ambulatory Visit | Attending: Neurosurgery | Admitting: Neurosurgery

## 2013-10-04 DIAGNOSIS — IMO0001 Reserved for inherently not codable concepts without codable children: Secondary | ICD-10-CM | POA: Insufficient documentation

## 2013-10-04 DIAGNOSIS — M4722 Other spondylosis with radiculopathy, cervical region: Secondary | ICD-10-CM

## 2013-10-04 DIAGNOSIS — M79609 Pain in unspecified limb: Secondary | ICD-10-CM | POA: Insufficient documentation

## 2013-10-04 DIAGNOSIS — M542 Cervicalgia: Secondary | ICD-10-CM | POA: Insufficient documentation

## 2013-10-04 DIAGNOSIS — Z21 Asymptomatic human immunodeficiency virus [HIV] infection status: Secondary | ICD-10-CM | POA: Diagnosis not present

## 2013-10-04 DIAGNOSIS — M25419 Effusion, unspecified shoulder: Secondary | ICD-10-CM | POA: Diagnosis not present

## 2013-10-04 DIAGNOSIS — M25519 Pain in unspecified shoulder: Secondary | ICD-10-CM | POA: Insufficient documentation

## 2013-10-04 DIAGNOSIS — I10 Essential (primary) hypertension: Secondary | ICD-10-CM | POA: Diagnosis not present

## 2013-10-04 NOTE — Progress Notes (Signed)
Physical Therapy Treatment Patient Details  Name: Hunter Pratt MRN: [id number] Date of Birth: Feb 22, 1956  Today's Date: 10/04/2013 Time: 1010-1115 PT Time Calculation (min): 65 min Charge: TE 1010-1145, Manual 1045-1058, Estim with ice: 1100-1115  Visit#: 7 of 8  Re-eval: 10/13/13 Assessment Diagnosis: Cervical Spondylosis Next MD Visit: Dr. Franky Macho - December Prior Therapy: in 1992 after his cervical fusion  Authorization: Medicare  Authorization Time Period:    Authorization Visit#: 7 of 10   Subjective: Symptoms/Limitations Symptoms: Pt reported rough night last night, reported pain Rt shoulder 5/10. Pain Assessment Currently in Pain?: Yes Pain Score: 5  Pain Location: Shoulder Pain Orientation: Right  Precautions/Restrictions  Precautions Precaution Comments: Cervical C4-5 Fusion  Exercise/Treatments Prone  Retraction: 15 reps Flexion: 10 reps Extension: 15 reps Horizontal ABduction 1: 10 reps Other Prone Exercises: SAR Bil UE 10x each Other Prone Exercises: row with 1# 15x Standing Extension: 10 reps;Theraband Theraband Level (Shoulder Extension): Level 4 (Blue) Row: 10 reps;Theraband Theraband Level (Shoulder Row): Level 4 (Blue) Retraction: 10 reps;Theraband Theraband Level (Shoulder Retraction): Level 4 (Blue)   Modalities Modalities: Electrical Stimulation;Cryotherapy Manual Therapy Manual Therapy: Myofascial release Myofascial Release: Trap and Rt scap region to reduce fascial restrictions in prone Cryotherapy Number Minutes Cryotherapy: 15 Minutes Cryotherapy Location: Shoulder Type of Cryotherapy: Ice pack Pharmacologist Location: Rt scapular region Statistician Action: IFES, hi low sweep to reduce pain and radiculopathy Electrical Stimulation Goals: Pain  Physical Therapy Assessment and Plan PT Assessment and Plan Clinical Impression Statement: Progressed prone postural strengthening exercises and  pt given theraband with worksheet to add to HEP, min cueing required for form and technique initially, pt able to demonstrate appropriate technique with theraband following cueing.  Manual techniques complete to reduce fascial restriction, ended session with estim and ice for pain control.  Pt stated pain free at end of session.   PT Plan: Re-eval next session.  Continue Manual techniques to decrease pain and tenderness to Rt scapular region and UE.  Continue to educate on posture and increase  scapular strength.     Goals Home Exercise Program Pt/caregiver will Perform Home Exercise Program: Independently PT Short Term Goals Time to Complete Short Term Goals: 3 weeks PT Short Term Goal 1: Pt will present with mild fascial restrictions and muscle spasms to his Rt scapular and UT report pain less than 5/10 for 50% of his day to his Rt UE. PT Short Term Goal 1 - Progress: Progressing toward goal PT Short Term Goal 2: Pt will improve his Rt shoulder AROM flexion: 170, abduction 160 for greater use of RUE with functional movements.  PT Short Term Goal 3: Pt will improve his RUE strength to 4/5 throughout and improve RUE to 70lbs for greater ease with holding a cup.  PT Short Term Goal 3 - Progress: Progressing toward goal PT Short Term Goal 4: Pt will be educated in proper posture to decrease radicular symptoms by 50%. PT Short Term Goal 4 - Progress: Progressing toward goal PT Long Term Goals Time to Complete Long Term Goals:  (6 weeks) PT Long Term Goal 1: Pt will improve postural strength to WNL in order to maintain approprirate sitting and standing posture x10 minutes with min cueing.  PT Long Term Goal 2: Pt will improve his RUE strength to WNL in order to complete Rt shoulder and arm AROM to WNL in order to retrieve a dish from an upper cabniet.  Long Term Goal 3: Pt will reduce his radicular symptoms  in order to maintain in a seated position without the need to elevate his RUE.   Problem  List Patient Active Problem List   Diagnosis Date Noted  . Cervical spondylosis with radiculopathy 09/14/2013  . Pain in joint, shoulder region 08/11/2013  . Wrist pain 08/11/2013  . DDD (degenerative disc disease), cervical 08/11/2013  . Grief reaction 08/03/2013  . HTN (hypertension) 07/27/2013  . Hematuria 05/03/2013  . Back pain 05/03/2013  . Enlarged lymph node 03/04/2013  . Eczematous dermatitis 11/14/2012  . Obesity 11/14/2012  . Systolic dysfunction 08/18/2012  . Glucose intolerance (impaired glucose tolerance) 07/30/2012  . Lower extremity edema 06/09/2012  . OSA (obstructive sleep apnea) 06/09/2012  . METHICILLIN RESISTANT STAPHYLOCOCCUS AUREUS INFECTION 10/04/2009  . DEGENERATIVE JOINT DISEASE, KNEES, BILATERAL 01/30/2009  . HYPERLIPIDEMIA 04/21/2008  . GERD 04/21/2008  . MOOD DISORDER IN CONDITIONS CLASSIFIED ELSEWHERE 02/28/2008  . PERIPHERAL NEUROPATHY 02/28/2008  . PUD 01/03/2008  . TOBACCO USER 09/29/2007  . HIV DISEASE 12/28/2006    PT - End of Session Activity Tolerance: Patient tolerated treatment well General Behavior During Therapy: [facility] for tasks assessed/performed PT Plan of Care PT Home Exercise Plan: theraband and wosksheet given  GP    [name] 10/04/2013, 12:02 PM

## 2013-10-06 ENCOUNTER — Ambulatory Visit (HOSPITAL_COMMUNITY)
Admission: RE | Admit: 2013-10-06 | Discharge: 2013-10-06 | Disposition: A | Discharge status: Home / Self Care | Payer: Medicare Other | Source: Ambulatory Visit | Attending: Family Medicine | Admitting: Family Medicine

## 2013-10-06 DIAGNOSIS — M4722 Other spondylosis with radiculopathy, cervical region: Secondary | ICD-10-CM

## 2013-10-06 NOTE — Evaluation (Signed)
Physical Therapy Re-Evaluation  Patient Details  Name: Hunter Pratt MRN: [id number] Date of Birth: 23-Mar-1956  Today's Date: 10/06/2013 Time: 1015-1110 PT Time Calculation (min): 55 min Charges: MMT/ROM: 1 Self Care: 1020-1030 Traction: 1030-1037 Manual: 1037-1100 Ice: 1100-1115             Visit#: 8 of 12  Re-eval: 10/20/13 Assessment Diagnosis: Cervical Spondylosis Next MD Visit: Dr. Franky Macho - December 16  Authorization: Medicare    Authorization Time Period:    Authorization Visit#: 8 of 18   Past Medical History:  Past Medical History  Diagnosis Date  . HIV infection   . Arthritis   . GERD (gastroesophageal reflux disease)   . Kidney stone   . PUD (peptic ulcer disease)   . DDD (degenerative disc disease), cervical   . OA (osteoarthritis) of knee   . Hearing loss     Bilateral  . Hyperlipidemia   . Systolic dysfunction     EF 40-45%   Past Surgical History:  Past Surgical History  Procedure Laterality Date  . Cervical discectomy  1990 and 1991    Subjective Symptoms/Limitations Symptoms: Pt reports that he has decreased his pain medication overall.  He is having more numbness today. having difficulty reaching out in front and becomes irritated.  has difficulty sitting for any length of time due to increased numbness to arm.  Pain Assessment Currently in Pain?: Yes Pain Score: 2  Pain Location: Arm Pain Orientation: Right  Precautions/Restrictions  Precautions Precaution Comments: Cervical C4-5 Fusion   Sensation/Coordination/Flexibility/Functional Tests Functional Tests Functional Tests:  Status: 75%, Limitation: 25% (was FOTO status 34%, limitation 66%)  RUE AROM (degrees) Right Shoulder Flexion: 170 Degrees (was 170) Right Shoulder ABduction: 155 Degrees (was 120) Right Shoulder Internal Rotation: 80 Degrees (was 80) Right Shoulder External Rotation: 80 Degrees (was 80) RUE Strength Right Shoulder Flexion: 5/5 (was 4/5) Right Shoulder  Extension: 5/5 (was 4/5) Right Shoulder ABduction: 5/5 (was 3+/5) Right Shoulder Internal Rotation: 5/5 (was 4/5) Right Shoulder External Rotation: 5/5 (was 4/5) Right Shoulder Horizontal ABduction: 5/5 (was 3/5) Right Shoulder Horizontal ADduction: 5/5 (was 3/5) Grip (lbs): 70 (was 62 (Lt grip 58)) Lateral Pinch: 20 lbs (was 18 (lt 20)) 3 Point Pinch: 18 lbs (was 18 (lt 22)) Palpation Palpation: moderate fascial restrictions to Bil scapular region  Mobility/Balance  Posture/Postural Control Posture/Postural Control: Postural limitations Postural Limitations: rounded shoulders/upper cross sydrome, has difficulty mainitaining approprirate posture for 30 seconds while sitting.   Exercise/Treatments Modalities Modalities: Cryotherapy Manual Therapy Manual Therapy: Other (comment) Myofascial Release: To Right upper, milddle and low Trap and Rt scap region with friction massage to reduce fascial restrictions in seated Other Manual Therapy: Cervical traction attempted x7 minutes 12 lbs intermittent, d/c due to cervical fusion, pt had no complaints of pain and had increased relief Cryotherapy Number Minutes Cryotherapy: 15 Minutes Cryotherapy Location: Shoulder (Right) Type of Cryotherapy: Ice pack Pharmacologist Goals: Pain  Physical Therapy Assessment and Plan PT Assessment and Plan Clinical Impression Statement: Mr. Quezada has attended 8 OP PT visits for cervical spondylosis with history of cervical disectomy and fusion with following findings: improved knowledge of posture, shoulder strength in now strong and pain (compared to weak and painful) and reports pain on average 3-4/10 with a 40-50% decrease in radicular symptoms.  Pt has received manual therapy and therpeautic exercise to strengthen postural muscule and decrease pain. At this time pt contineus to have greatest limiation due to radicular symptoms which are most likely due  to trigger points to  rhomboids and teres major muscle group.   Pt will benefit from skilled therapeutic intervention in order to improve on the following deficits: Pain;Improper body mechanics;Impaired perceived functional ability PT Frequency: Min 2X/week PT Duration:  (2 weeks) PT Plan: No traction secondary to fusions.  Continue with manual techniques to decrease pain and tenderness to Rt and Lt scapular region.  Theraband for postureal strengthening and prone exercises for postural strengthening (shoulder is 5/5, notable atrophy to scapular musculature)    Goals Home Exercise Program Pt/caregiver will Perform Home Exercise Program: Independently PT Goal: Perform Home Exercise Program - Progress: Met PT Short Term Goals Time to Complete Short Term Goals: 3 weeks PT Short Term Goal 1: Pt will present with mild fascial restrictions and muscle spasms to his Rt scapular and UT report pain less than 5/10 for 50% of his day to his Rt UE. PT Short Term Goal 1 - Progress: Progressing toward goal PT Short Term Goal 2: Pt will improve his Rt shoulder AROM flexion: 170, abduction 160 for greater use of RUE with functional movements.  PT Short Term Goal 2 - Progress: Met PT Short Term Goal 3: Pt will improve his RUE strength to 4/5 throughout and improve RUE to 70lbs for greater ease with holding a cup.  PT Short Term Goal 3 - Progress: Met PT Short Term Goal 4: Pt will be educated in proper posture to decrease radicular symptoms by 50%. (remains 3-4/10 intensity and decreased 40-50%) PT Short Term Goal 4 - Progress: Met PT Long Term Goals Time to Complete Long Term Goals:  (6 weeks) PT Long Term Goal 1: Pt will improve postural strength to WNL in order to maintain approprirate sitting and standing posture x10 minutes with min cueing.  PT Long Term Goal 2: Pt will improve his RUE strength to WNL in order to complete Rt shoulder and arm AROM to WNL in order to retrieve a dish from an upper cabniet.  Long Term Goal 3: Pt  will reduce his radicular symptoms in order to maintain in a seated position without the need to elevate his RUE.   Problem List Patient Active Problem List   Diagnosis Date Noted  . Cervical spondylosis with radiculopathy 09/14/2013  . Pain in joint, shoulder region 08/11/2013  . Wrist pain 08/11/2013  . DDD (degenerative disc disease), cervical 08/11/2013  . Grief reaction 08/03/2013  . HTN (hypertension) 07/27/2013  . Hematuria 05/03/2013  . Back pain 05/03/2013  . Enlarged lymph node 03/04/2013  . Eczematous dermatitis 11/14/2012  . Obesity 11/14/2012  . Systolic dysfunction 08/18/2012  . Glucose intolerance (impaired glucose tolerance) 07/30/2012  . Lower extremity edema 06/09/2012  . OSA (obstructive sleep apnea) 06/09/2012  . METHICILLIN RESISTANT STAPHYLOCOCCUS AUREUS INFECTION 10/04/2009  . DEGENERATIVE JOINT DISEASE, KNEES, BILATERAL 01/30/2009  . HYPERLIPIDEMIA 04/21/2008  . GERD 04/21/2008  . MOOD DISORDER IN CONDITIONS CLASSIFIED ELSEWHERE 02/28/2008  . PERIPHERAL NEUROPATHY 02/28/2008  . PUD 01/03/2008  . TOBACCO USER 09/29/2007  . HIV DISEASE 12/28/2006    PT - End of Session Activity Tolerance: Patient tolerated treatment well General Behavior During Therapy: WFL for tasks assessed/performed PT Plan of Care PT Patient Instructions: discussed FOTO score, had to retake due to mis-interpertation, posture and HEP Consulted and Agree with Plan of Care: Patient  GP Functional Assessment Tool Used: FOTO Functional Limitation: Carrying, moving and handling objects Carrying, Moving and Handling Objects Current Status ([id number]): At least 20 percent but less than 40 percent impaired,  limited or restricted Carrying, Moving and Handling Objects Goal Status [phone]): At least 20 percent but less than 40 percent impaired, limited or restricted  Quill Grinder, MPT, ATC 10/06/2013, 11:36 AM  Physician Documentation Your signature is required to indicate approval of the  treatment plan as stated above.  Please sign and either send electronically or make a copy of this report for your files and return this physician signed original.   Please mark one 1.__approve of plan  2. ___approve of plan with the following conditions.   ______________________________                                                          _____________________ Physician Signature                                                                                                             Date

## 2013-10-11 ENCOUNTER — Ambulatory Visit (HOSPITAL_COMMUNITY)
Admission: RE | Admit: 2013-10-11 | Discharge: 2013-10-11 | Disposition: A | Discharge status: Home / Self Care | Payer: Medicare Other | Source: Ambulatory Visit | Attending: Family Medicine | Admitting: Family Medicine

## 2013-10-11 DIAGNOSIS — M4722 Other spondylosis with radiculopathy, cervical region: Secondary | ICD-10-CM

## 2013-10-11 NOTE — Progress Notes (Signed)
Physical Therapy Treatment Patient Details  Name: Hunter Pratt MRN: [id number] Date of Birth: 10-13-1956  Today's Date: 10/11/2013 Time: 4098-1191 PT Time Calculation (min): 43 min Charges:  Manual: [id number] TE: [id number] Visit#: 9 of 12  Re-eval: 10/20/13    Authorization: Medicare  Authorization Time Period:    Authorization Visit#: 9 of 18   Subjective: Symptoms/Limitations Symptoms: It hurt for about 2 days but then I was able to sit better and sleep on my Rt arm which I have not been able to do in a long time.  Pain Assessment Currently in Pain?: Yes Pain Score: 1  Pain Location: Arm Pain Orientation: Right  Precautions/Restrictions     Exercise/Treatments Seated Horizontal ABduction: Both;15 reps Flexion: Both;15 reps Abduction: Both;15 reps Other Seated Exercises: shoulder rolls backwards x15 Other Seated Exercises: Radial nerve glides: pronation x15 supination x15 Standing External Rotation: Right;15 reps;Theraband Theraband Level (Shoulder External Rotation): Level 4 (Blue) Other Standing Exercises: latissimus pull down x15 reps blue band ROM / Strengthening / Isometric Strengthening UBE (Upper Arm Bike): 8 minutes: 4 forward/4 backwards Wall Wash: 2 minutes Thumb Tacks: 30 sec with moderate VC X to V Arms: 15 reps Other ROM/Strengthening Exercises: Corner elbow press 5x10 sec holds   Manual Therapy Myofascial Release: To Right upper, milddle and low Trap and Rt scap region with friction massage to reduce fascial restrictions in seated  Physical Therapy Assessment and Plan PT Assessment and Plan Clinical Impression Statement: Pt has a significant decrease in fascial restrictions with decreased pain and radicular symptoms to RUE, except remains in his hand intermittently.  Added strengthening exercises to improve rhomoboid and rotator cuff strength to improve posture.  PT Frequency: Min 2X/week PT Duration:  (2 weeks) PT Plan: No traction secondary to  fusions.  Continue with manual techniques to decrease pain and tenderness to Rt and Lt scapular region.  Theraband for postureal strengthening and prone exercises for postural strengthening (shoulder is 5/5, notable atrophy to scapular musculature)    Goals    Problem List Patient Active Problem List   Diagnosis Date Noted  . Cervical spondylosis with radiculopathy 09/14/2013  . Pain in joint, shoulder region 08/11/2013  . Wrist pain 08/11/2013  . DDD (degenerative disc disease), cervical 08/11/2013  . Grief reaction 08/03/2013  . HTN (hypertension) 07/27/2013  . Hematuria 05/03/2013  . Back pain 05/03/2013  . Enlarged lymph node 03/04/2013  . Eczematous dermatitis 11/14/2012  . Obesity 11/14/2012  . Systolic dysfunction 08/18/2012  . Glucose intolerance (impaired glucose tolerance) 07/30/2012  . Lower extremity edema 06/09/2012  . OSA (obstructive sleep apnea) 06/09/2012  . METHICILLIN RESISTANT STAPHYLOCOCCUS AUREUS INFECTION 10/04/2009  . DEGENERATIVE JOINT DISEASE, KNEES, BILATERAL 01/30/2009  . HYPERLIPIDEMIA 04/21/2008  . GERD 04/21/2008  . MOOD DISORDER IN CONDITIONS CLASSIFIED ELSEWHERE 02/28/2008  . PERIPHERAL NEUROPATHY 02/28/2008  . PUD 01/03/2008  . TOBACCO USER 09/29/2007  . HIV DISEASE 12/28/2006    PT - End of Session Activity Tolerance: Patient tolerated treatment well General Behavior During Therapy: WFL for tasks assessed/performed PT Plan of Care Consulted and Agree with Plan of Care: Patient  GP Functional Assessment Tool Used: FOTO  Casmira Cramer, MPT, ATC 10/11/2013, 9:24 AM

## 2013-10-13 ENCOUNTER — Ambulatory Visit (HOSPITAL_COMMUNITY)
Admission: RE | Admit: 2013-10-13 | Discharge: 2013-10-13 | Disposition: A | Discharge status: Home / Self Care | Payer: Medicare Other | Source: Ambulatory Visit | Attending: Family Medicine | Admitting: Family Medicine

## 2013-10-13 NOTE — Progress Notes (Signed)
Physical Therapy Treatment Patient Details  Name: Hunter Pratt MRN: [id number] Date of Birth: 03-19-1956  Today's Date: 10/13/2013 Time: 1301-1350 PT Time Calculation (min): 49 min  Visit#: 10 of 12  Re-eval: 10/20/13 Authorization: Medicare; foto/gcode updated on visit #8  Authorization Visit#: 10 of 18  Charges:  therex 1301-1337 (36'), manual 1338-1350 (12')  Subjective: Symptoms/Limitations Symptoms: Pt states he is sore in his Rt posterior shoulder but not hurting today. Pain Assessment Currently in Pain?: No/denies   Exercise/Treatments Prone  Retraction: 15 reps Flexion: 15 reps;Limitations Flexion Limitations: dbl UE lifts Extension: 15 reps Horizontal ABduction 1: 15 reps Standing External Rotation: Right;15 reps;Theraband Theraband Level (Shoulder External Rotation): Level 4 (Blue) Extension: Theraband;15 reps Theraband Level (Shoulder Extension): Level 4 (Blue) Row: Theraband;15 reps Theraband Level (Shoulder Row): Level 4 (Blue) Retraction: Theraband;15 reps Theraband Level (Shoulder Retraction): Level 4 (Blue) Other Standing Exercises: latissimus pull down x15 reps blue band ROM / Strengthening / Isometric Strengthening UBE (Upper Arm Bike): 8 minutes: 4 forward/4 backwards Wall Wash: 2 minutes Thumb Tacks: 2X30 sec with moderate VC Other ROM/Strengthening Exercises: Corner elbow press 10x10 sec holds     Manual Therapy Manual Therapy: Other (comment) Other Manual Therapy: Prone:  MFR To Right upper, middle and lower trap and Rt scap region with friction massage to reduce fascial restrictions and pain.  Physical Therapy Assessment and Plan PT Assessment and Plan Clinical Impression Statement: Continued reduction in fascial restrictions and pain.  Able to increase reps of therex and resume prone exercises today.  Improved postural awareness. PT Duration:  (2 weeks) PT Plan: No traction secondary to fusions.  Continue with manual techniques to decrease  pain and tenderness to Rt and Lt scapular region.  May add weight to prone scapular stregthening exercises.     Problem List Patient Active Problem List   Diagnosis Date Noted  . Cervical spondylosis with radiculopathy 09/14/2013  . Pain in joint, shoulder region 08/11/2013  . Wrist pain 08/11/2013  . DDD (degenerative disc disease), cervical 08/11/2013  . Grief reaction 08/03/2013  . HTN (hypertension) 07/27/2013  . Hematuria 05/03/2013  . Back pain 05/03/2013  . Enlarged lymph node 03/04/2013  . Eczematous dermatitis 11/14/2012  . Obesity 11/14/2012  . Systolic dysfunction 08/18/2012  . Glucose intolerance (impaired glucose tolerance) 07/30/2012  . Lower extremity edema 06/09/2012  . OSA (obstructive sleep apnea) 06/09/2012  . METHICILLIN RESISTANT STAPHYLOCOCCUS AUREUS INFECTION 10/04/2009  . DEGENERATIVE JOINT DISEASE, KNEES, BILATERAL 01/30/2009  . HYPERLIPIDEMIA 04/21/2008  . GERD 04/21/2008  . MOOD DISORDER IN CONDITIONS CLASSIFIED ELSEWHERE 02/28/2008  . PERIPHERAL NEUROPATHY 02/28/2008  . PUD 01/03/2008  . TOBACCO USER 09/29/2007  . HIV DISEASE 12/28/2006    PT - End of Session Activity Tolerance: Patient tolerated treatment well General Behavior During Therapy: WFL for tasks assessed/performed PT Plan of Care Consulted and Agree with Plan of Care: Patient  GP Functional Assessment Tool Used: Glenford Bayley, PTA/CLT 10/13/2013, 1:54 PM

## 2013-10-18 ENCOUNTER — Ambulatory Visit (HOSPITAL_COMMUNITY)
Admission: RE | Admit: 2013-10-18 | Discharge: 2013-10-18 | Disposition: A | Discharge status: Home / Self Care | Payer: Medicare Other | Source: Ambulatory Visit | Attending: Family Medicine | Admitting: Family Medicine

## 2013-10-18 DIAGNOSIS — M4722 Other spondylosis with radiculopathy, cervical region: Secondary | ICD-10-CM

## 2013-10-18 NOTE — Progress Notes (Signed)
Physical Therapy Treatment Patient Details  Name: Hunter Pratt MRN: [id number] Date of Birth: 09-18-1956  Today's Date: 10/18/2013 Time: 1003-1100 PT Time Calculation (min): 57 min Charge: TE 1003-1048, Manual 9629-5284   Visit#: 11 of 12  Re-eval: 10/20/13 Assessment Diagnosis: Cervical Spondylosis Next MD Visit: Dr. Franky Macho - December 16 Prior Therapy: in 1992 after his cervical fusion  Authorization: Medicare; foto/gcode updated on visit #8  Authorization Time Period:    Authorization Visit#: 11 of 18   Subjective: Symptoms/Limitations Symptoms: Pt stated his only real problem has been the numbness in ring and pinky finger on Lt arm, reports cramps following exercises.  No c /o pain today.   Pain Assessment Currently in Pain?: No/denies  Precautions/Restrictions  Precautions Precaution Comments: Cervical C4-5 Fusion  Exercise/Treatments Prone  Flexion: 15 reps;Limitations Flexion Limitations: DAR Extension: 15 reps;Weights Extension Weight (lbs): 2 Horizontal ABduction 1: 15 reps Other Prone Exercises: Row 15x with 2# Other Prone Exercises: POE serratus anterior Standing External Rotation: Right;15 reps;Theraband Theraband Level (Shoulder External Rotation): Level 4 (Blue) Extension: Theraband;15 reps Theraband Level (Shoulder Extension): Level 4 (Blue) Row: Theraband;15 reps Theraband Level (Shoulder Row): Level 4 (Blue) Retraction: Theraband;15 reps Theraband Level (Shoulder Retraction): Level 4 (Blue) Other Standing Exercises: latissimus pull down x15 reps blue band Other Standing Exercises: Ulnar nerve glides x 2 reps ROM / Strengthening / Isometric Strengthening UBE (Upper Arm Bike): 8 minutes: 4 forward/4 backwards @2 .5 Wall Wash: 2 minutes Thumb Tacks: 2X30 sec with moderate VC Other ROM/Strengthening Exercises: Corner elbow press 10x10 sec holds    Manual Therapy Other Manual Therapy: Prone: MFR To Right upper, middle and lower trap and Rt scap  region with friction massage to reduce fascial restrictions and pain  Physical Therapy Assessment and Plan PT Assessment and Plan Clinical Impression Statement: Added ulnar nerve glides to increase nerve lengthening to increase sensations to hand.  Able to add weight with prone exercises today with min cueing required for technique for correct musculature activation.  Manual techniques complete to scapular region to reduce fascial restrictins. PT Plan: Re-eval next session.      Goals Home Exercise Program Pt/caregiver will Perform Home Exercise Program: Independently PT Short Term Goals Time to Complete Short Term Goals: 3 weeks PT Short Term Goal 1: Pt will present with mild fascial restrictions and muscle spasms to his Rt scapular and UT report pain less than 5/10 for 50% of his day to his Rt UE. PT Short Term Goal 1 - Progress: Progressing toward goal PT Short Term Goal 2: Pt will improve his Rt shoulder AROM flexion: 170, abduction 160 for greater use of RUE with functional movements.  PT Short Term Goal 3: Pt will improve his RUE strength to 4/5 throughout and improve RUE to 70lbs for greater ease with holding a cup.  PT Short Term Goal 4: Pt will be educated in proper posture to decrease radicular symptoms by 50%. (remains 3-4/10 intensity and decreased 40-50%) PT Long Term Goals Time to Complete Long Term Goals:  (6 weeks) PT Long Term Goal 1: Pt will improve postural strength to WNL in order to maintain approprirate sitting and standing posture x10 minutes with min cueing.  PT Long Term Goal 1 - Progress: Progressing toward goal PT Long Term Goal 2: Pt will improve his RUE strength to WNL in order to complete Rt shoulder and arm AROM to WNL in order to retrieve a dish from an upper cabniet.  Long Term Goal 3: Pt will reduce his radicular  symptoms in order to maintain in a seated position without the need to elevate his RUE.  Long Term Goal 3 Progress: Progressing toward  goal  Problem List Patient Active Problem List   Diagnosis Date Noted  . Cervical spondylosis with radiculopathy 09/14/2013  . Pain in joint, shoulder region 08/11/2013  . Wrist pain 08/11/2013  . DDD (degenerative disc disease), cervical 08/11/2013  . Grief reaction 08/03/2013  . HTN (hypertension) 07/27/2013  . Hematuria 05/03/2013  . Back pain 05/03/2013  . Enlarged lymph node 03/04/2013  . Eczematous dermatitis 11/14/2012  . Obesity 11/14/2012  . Systolic dysfunction 08/18/2012  . Glucose intolerance (impaired glucose tolerance) 07/30/2012  . Lower extremity edema 06/09/2012  . OSA (obstructive sleep apnea) 06/09/2012  . METHICILLIN RESISTANT STAPHYLOCOCCUS AUREUS INFECTION 10/04/2009  . DEGENERATIVE JOINT DISEASE, KNEES, BILATERAL 01/30/2009  . HYPERLIPIDEMIA 04/21/2008  . GERD 04/21/2008  . MOOD DISORDER IN CONDITIONS CLASSIFIED ELSEWHERE 02/28/2008  . PERIPHERAL NEUROPATHY 02/28/2008  . PUD 01/03/2008  . TOBACCO USER 09/29/2007  . HIV DISEASE 12/28/2006    PT - End of Session Activity Tolerance: Patient tolerated treatment well General Behavior During Therapy: [facility] for tasks assessed/performed  GP    [name] 10/18/2013, 11:15 AM

## 2013-10-20 ENCOUNTER — Ambulatory Visit (HOSPITAL_COMMUNITY)
Admission: RE | Admit: 2013-10-20 | Discharge: 2013-10-20 | Disposition: A | Discharge status: Home / Self Care | Payer: Medicare Other | Source: Ambulatory Visit | Attending: Family Medicine | Admitting: Family Medicine

## 2013-10-20 DIAGNOSIS — M4722 Other spondylosis with radiculopathy, cervical region: Secondary | ICD-10-CM

## 2013-10-20 NOTE — Evaluation (Signed)
Physical Therapy Evaluation  Patient Details  Name: Hunter Pratt MRN: [id number] Date of Birth: 11-16-1956  Today's Date: 10/20/2013 Time: 4098-1191 PT Time Calculation (min): 38 min Charges: 1 MMT/ROM Self Care: 4782-9562 Manual: 1308-6578             Visit#: 12 of 12  Re-eval: 10/20/13 Assessment Diagnosis: Cervical Spondylosis Next MD Visit: Dr. Franky Macho - December 16  Authorization: Medicare; foto/gcode updated on visit #8    Authorization Time Period:    Authorization Visit#: 12 of 18   Subjective Symptoms/Limitations Symptoms: Has no pain, feels he can do all the things he couldn't do before.  He is sitting much better without any increased pain, has intermittent numbness.  Pain Assessment Currently in Pain?: No/denies  Precautions/Restrictions  Precautions Precaution Comments: Cervical C4-5 Fusion  RUE AROM (degrees) Right Shoulder Flexion: 180 Degrees (was 170) Right Shoulder ABduction: 180 Degrees (was 155) Palpation Palpation: mild fascial restrictions and muscle spasms  Mobility/Balance  Posture/Postural Control Posture/Postural Control: Postural limitations Postural Limitations: mild rounded shoulders`   Exercise/Treatments ROM / Strengthening / Isometric Strengthening UBE (Upper Arm Bike): 8 minutes: 4 forward/4 backwards @2 .5 "W" Arms: 15 reps X to V Arms: 15 reps  Manual Therapy Myofascial Release: Seated: To Right upper, milddle and low Trap and Rt scap region with friction massage to reduce fascial restrictions in seated  Physical Therapy Assessment and Plan PT Assessment and Plan Clinical Impression Statement: Hunter Pratt has attended 12 OP PT vistis in the past 5 weeks with the following finidngs: he has met all goals, has minimal fascial restrictions and muscle spasms, decreased radicular symtoms to RUE when sitting, improved posture and postural strength, independent with HEP,  shoulder and cervical strength is WNL and at this time he is agreeable  to discharge.   PT Frequency: Min 2X/week PT Plan: D/C    Goals Home Exercise Program Pt/caregiver will Perform Home Exercise Program: Independently PT Goal: Perform Home Exercise Program - Progress: Met PT Short Term Goals Time to Complete Short Term Goals: 3 weeks PT Short Term Goal 1: Pt will present with mild fascial restrictions and muscle spasms to his Rt scapular and UT report pain less than 5/10 for 50% of his day to his Rt UE. (intermittent numbness) PT Short Term Goal 1 - Progress: Met PT Short Term Goal 2: Pt will improve his Rt shoulder AROM flexion: 170, abduction 160 for greater use of RUE with functional movements.  PT Short Term Goal 2 - Progress: Met PT Short Term Goal 3: Pt will improve his RUE strength to 4/5 throughout and improve RUE to 70lbs for greater ease with holding a cup.  PT Short Term Goal 3 - Progress: Met PT Short Term Goal 4: Pt will be educated in proper posture to decrease radicular symptoms by 50%. (remains 3-4/10 intensity and decreased 40-50%) PT Short Term Goal 4 - Progress: Met PT Long Term Goals Time to Complete Long Term Goals:  (6 weeks) PT Long Term Goal 1: Pt will improve postural strength to WNL in order to maintain approprirate sitting and standing posture x10 minutes with min cueing.  (15-20 minutes) PT Long Term Goal 1 - Progress: Met PT Long Term Goal 2: Pt will improve his RUE strength to WNL in order to complete Rt shoulder and arm AROM to WNL in order to retrieve a dish from an upper cabniet.  PT Long Term Goal 2 - Progress: Met Long Term Goal 3: Pt will reduce his radicular symptoms in  order to maintain in a seated position without the need to elevate his RUE.  Long Term Goal 3 Progress: Met  Problem List Patient Active Problem List   Diagnosis Date Noted  . Cervical spondylosis with radiculopathy 09/14/2013  . Pain in joint, shoulder region 08/11/2013  . Wrist pain 08/11/2013  . DDD (degenerative disc disease), cervical  08/11/2013  . Grief reaction 08/03/2013  . HTN (hypertension) 07/27/2013  . Hematuria 05/03/2013  . Back pain 05/03/2013  . Enlarged lymph node 03/04/2013  . Eczematous dermatitis 11/14/2012  . Obesity 11/14/2012  . Systolic dysfunction 08/18/2012  . Glucose intolerance (impaired glucose tolerance) 07/30/2012  . Lower extremity edema 06/09/2012  . OSA (obstructive sleep apnea) 06/09/2012  . METHICILLIN RESISTANT STAPHYLOCOCCUS AUREUS INFECTION 10/04/2009  . DEGENERATIVE JOINT DISEASE, KNEES, BILATERAL 01/30/2009  . HYPERLIPIDEMIA 04/21/2008  . GERD 04/21/2008  . MOOD DISORDER IN CONDITIONS CLASSIFIED ELSEWHERE 02/28/2008  . PERIPHERAL NEUROPATHY 02/28/2008  . PUD 01/03/2008  . TOBACCO USER 09/29/2007  . HIV DISEASE 12/28/2006    PT - End of Session Activity Tolerance: Patient tolerated treatment well General Behavior During Therapy: [facility] for tasks assessed/performed PT Plan of Care PT Patient Instructions: discussed FOTO and posture  GP Functional Assessment Tool Used: FOTO: 94/6 Functional Limitation: Carrying, moving and handling objects Carrying, Moving and Handling Objects Goal Status (W0981): At least 20 percent but less than 40 percent impaired, limited or restricted Carrying, Moving and Handling Objects Discharge Status [phone]): At least 1 percent but less than 20 percent impaired, limited or restricted  Hessie Varone, MPT, ATC 10/20/2013, 12:14 PM  Physician Documentation Your signature is required to indicate approval of the treatment plan as stated above.  Please sign and either send electronically or make a copy of this report for your files and return this physician signed original.   Please mark one 1.__approve of plan  2. ___approve of plan with the following conditions.   ______________________________                                                          _____________________ Physician Signature                                                                                                              Date

## 2013-11-02 ENCOUNTER — Ambulatory Visit (INDEPENDENT_AMBULATORY_CARE_PROVIDER_SITE_OTHER): Payer: Medicare Other | Admitting: Family Medicine

## 2013-11-02 VITALS — BP 110/70 | HR 68 | Temp 98.5°F | Resp 18 | Ht 70.0 in | Wt 244.0 lb

## 2013-11-02 DIAGNOSIS — E785 Hyperlipidemia, unspecified: Secondary | ICD-10-CM

## 2013-11-02 DIAGNOSIS — F4321 Adjustment disorder with depressed mood: Secondary | ICD-10-CM

## 2013-11-02 DIAGNOSIS — R7302 Impaired glucose tolerance (oral): Secondary | ICD-10-CM

## 2013-11-02 DIAGNOSIS — R7309 Other abnormal glucose: Secondary | ICD-10-CM

## 2013-11-02 MED ORDER — GABAPENTIN 300 MG PO CAPS: 300.0000 mg | ORAL_CAPSULE | Freq: Every day | ORAL | Status: DC

## 2013-11-02 MED ORDER — TRAZODONE HCL 50 MG PO TABS: 50.0000 mg | ORAL_TABLET | Freq: Every evening | ORAL | Status: DC | PRN

## 2013-11-02 NOTE — Patient Instructions (Signed)
Start Trazodone at bedtime for sleep Continue all other medications F/U 4 months

## 2013-11-03 ENCOUNTER — Encounter: Payer: Self-pay | Admitting: Family Medicine

## 2013-11-03 NOTE — Assessment & Plan Note (Addendum)
Will have repeat fasting lipid panel done with his labs for ID Continue statin drug and fish oil

## 2013-11-03 NOTE — Progress Notes (Signed)
   Subjective:    Patient ID: EVART MCDONNELL, male    DOB: [birth year], 57 y.o.   MRN: [id number]  HPI Patient here to follow chronic medical problems. He he continues to have difficulty with sleep. Ambien did not help. He states that he and his mother are doing well since his father passed. Neck pain he was seen by neurosurgery he is status post physical therapy which is improved his pain. He still gets some tingling he continues to take gabapentin tramadol as needed.  Prediabetes due for repeat A1c. Hyperlipidemia-he is taking fistula for his elevated triglycerides.   Review of Systems  GEN- denies fatigue, fever, weight loss,weakness, recent illness HEENT- denies eye drainage, change in vision, nasal discharge, CVS- denies chest pain, palpitations RESP- denies SOB, cough, wheeze ABD- denies N/V, change in stools, abd pain GU- denies dysuria, hematuria, dribbling, incontinence MSK- + joint pain, muscle aches, injury Neuro- denies headache, dizziness, syncope, seizure activity      Objective:   Physical Exam GEN- NAD, alert and oriented x3 HEENT- PERRL, EOMI, non injected sclera, pink conjunctiva, MMM, oropharynx clear Neck- no LAD CVS- RRR, no murmur RESP-CTAB EXT- No edema Pulses- Radial 2+ Psych- teary eyed discussing father, otherwise not overly depressed or anxious       Assessment & Plan:

## 2013-11-03 NOTE — Assessment & Plan Note (Signed)
They were unable to run the sample A1c in the office therefore we will have this drawn at a different lab.

## 2013-11-03 NOTE — Assessment & Plan Note (Signed)
Trial of trazodone. 

## 2013-11-07 ENCOUNTER — Telehealth: Payer: Self-pay | Admitting: *Deleted

## 2013-11-07 NOTE — Telephone Encounter (Signed)
Pt awaare to go have labs redrawn

## 2013-11-07 NOTE — Telephone Encounter (Signed)
Message copied by Samuella Cota on Mon Nov 07, 2013  7:55 AM ------      Message from: Milinda Antis F      Created: Thu Nov 03, 2013  5:49 PM      Regarding: A1C needs to be drawn in [place]              His sample did not read at the visit, he needs to have this drawn in [place], order placed ------

## 2013-11-15 DIAGNOSIS — M47812 Spondylosis without myelopathy or radiculopathy, cervical region: Secondary | ICD-10-CM | POA: Diagnosis not present

## 2013-12-09 DIAGNOSIS — M4802 Spinal stenosis, cervical region: Secondary | ICD-10-CM | POA: Diagnosis not present

## 2013-12-09 DIAGNOSIS — M5412 Radiculopathy, cervical region: Secondary | ICD-10-CM | POA: Diagnosis not present

## 2013-12-27 ENCOUNTER — Other Ambulatory Visit: Payer: Self-pay | Admitting: Family Medicine

## 2013-12-27 ENCOUNTER — Other Ambulatory Visit: Payer: Self-pay | Admitting: *Deleted

## 2013-12-27 DIAGNOSIS — R739 Hyperglycemia, unspecified: Secondary | ICD-10-CM

## 2013-12-27 DIAGNOSIS — F32A Depression, unspecified: Secondary | ICD-10-CM

## 2013-12-27 DIAGNOSIS — F329 Major depressive disorder, single episode, unspecified: Secondary | ICD-10-CM

## 2013-12-27 DIAGNOSIS — Z113 Encounter for screening for infections with a predominantly sexual mode of transmission: Secondary | ICD-10-CM

## 2013-12-27 DIAGNOSIS — Z139 Encounter for screening, unspecified: Secondary | ICD-10-CM

## 2013-12-27 DIAGNOSIS — K219 Gastro-esophageal reflux disease without esophagitis: Secondary | ICD-10-CM

## 2013-12-27 DIAGNOSIS — B20 Human immunodeficiency virus [HIV] disease: Secondary | ICD-10-CM

## 2013-12-27 DIAGNOSIS — F0630 Mood disorder due to known physiological condition, unspecified: Secondary | ICD-10-CM

## 2013-12-27 DIAGNOSIS — F17200 Nicotine dependence, unspecified, uncomplicated: Secondary | ICD-10-CM

## 2013-12-27 MED ORDER — INTELENCE 200 MG PO TABS: 200.0000 mg | ORAL_TABLET | Freq: Two times a day (BID) | ORAL | Status: DC

## 2013-12-27 MED ORDER — OMEPRAZOLE 20 MG PO CPDR: 40.0000 mg | DELAYED_RELEASE_CAPSULE | Freq: Every day | ORAL | Status: DC

## 2013-12-27 MED ORDER — RALTEGRAVIR POTASSIUM 400 MG PO TABS: 400.0000 mg | ORAL_TABLET | Freq: Two times a day (BID) | ORAL | Status: DC

## 2013-12-27 MED ORDER — EMTRICITABINE-TENOFOVIR DF 200-300 MG PO TABS: 1.0000 | ORAL_TABLET | Freq: Every day | ORAL | Status: DC

## 2013-12-27 NOTE — Telephone Encounter (Signed)
Last Rf 10/14  #60 + 3  Ok refill?

## 2013-12-27 NOTE — Telephone Encounter (Signed)
Okay to refill? 

## 2013-12-28 NOTE — Telephone Encounter (Signed)
rx faxed to pharmacy

## 2013-12-29 ENCOUNTER — Other Ambulatory Visit: Payer: Self-pay | Admitting: Family Medicine

## 2013-12-30 NOTE — Telephone Encounter (Signed)
This refill was faxed to pharmacy on 1/27.  Pharmacy was called and refill verified

## 2014-01-26 ENCOUNTER — Ambulatory Visit: Payer: Self-pay | Admitting: *Deleted

## 2014-01-26 ENCOUNTER — Ambulatory Visit: Payer: Medicare Other

## 2014-01-26 ENCOUNTER — Other Ambulatory Visit: Payer: Medicare Other

## 2014-01-30 ENCOUNTER — Other Ambulatory Visit: Payer: Medicare Other

## 2014-02-01 ENCOUNTER — Other Ambulatory Visit: Payer: Self-pay | Admitting: *Deleted

## 2014-02-01 DIAGNOSIS — F32A Depression, unspecified: Secondary | ICD-10-CM

## 2014-02-01 DIAGNOSIS — F17200 Nicotine dependence, unspecified, uncomplicated: Secondary | ICD-10-CM

## 2014-02-01 DIAGNOSIS — R739 Hyperglycemia, unspecified: Secondary | ICD-10-CM

## 2014-02-01 DIAGNOSIS — F329 Major depressive disorder, single episode, unspecified: Secondary | ICD-10-CM

## 2014-02-01 DIAGNOSIS — Z139 Encounter for screening, unspecified: Secondary | ICD-10-CM

## 2014-02-01 DIAGNOSIS — B20 Human immunodeficiency virus [HIV] disease: Secondary | ICD-10-CM

## 2014-02-01 DIAGNOSIS — F0630 Mood disorder due to known physiological condition, unspecified: Secondary | ICD-10-CM

## 2014-02-01 DIAGNOSIS — Z113 Encounter for screening for infections with a predominantly sexual mode of transmission: Secondary | ICD-10-CM

## 2014-02-01 DIAGNOSIS — K219 Gastro-esophageal reflux disease without esophagitis: Secondary | ICD-10-CM

## 2014-02-01 MED ORDER — EMTRICITABINE-TENOFOVIR DF 200-300 MG PO TABS: 1.0000 | ORAL_TABLET | Freq: Every day | ORAL | Status: DC

## 2014-02-01 MED ORDER — INTELENCE 200 MG PO TABS: 200.0000 mg | ORAL_TABLET | Freq: Two times a day (BID) | ORAL | Status: DC

## 2014-02-01 MED ORDER — RALTEGRAVIR POTASSIUM 400 MG PO TABS: 400.0000 mg | ORAL_TABLET | Freq: Two times a day (BID) | ORAL | Status: DC

## 2014-02-03 ENCOUNTER — Other Ambulatory Visit: Payer: Medicare Other

## 2014-02-03 DIAGNOSIS — Z113 Encounter for screening for infections with a predominantly sexual mode of transmission: Secondary | ICD-10-CM

## 2014-02-03 DIAGNOSIS — B20 Human immunodeficiency virus [HIV] disease: Secondary | ICD-10-CM | POA: Diagnosis not present

## 2014-02-03 DIAGNOSIS — I10 Essential (primary) hypertension: Secondary | ICD-10-CM | POA: Diagnosis not present

## 2014-02-03 DIAGNOSIS — E785 Hyperlipidemia, unspecified: Secondary | ICD-10-CM | POA: Diagnosis not present

## 2014-02-03 LAB — COMPLETE METABOLIC PANEL WITH GFR
ALT: 19 U/L (ref 0–53)
AST: 19 U/L (ref 0–37)
Albumin: 4 g/dL (ref 3.5–5.2)
Alkaline Phosphatase: 106 U/L (ref 39–117)
BUN: 12 mg/dL (ref 6–23)
CO2: 27 mEq/L (ref 19–32)
Calcium: 9.8 mg/dL (ref 8.4–10.5)
Chloride: 100 mEq/L (ref 96–112)
Creat: 1.01 mg/dL (ref 0.50–1.35)
GFR, Est African American: >89 mL/min
GFR, Est Non African American: 83 mL/min
Glucose, Bld: 127 mg/dL — ABNORMAL HIGH (ref 70–99)
Potassium: 4.3 mEq/L (ref 3.5–5.3)
Sodium: 134 mEq/L — ABNORMAL LOW (ref 135–145)
Total Bilirubin: 0.3 mg/dL (ref 0.2–1.2)
Total Protein: 7.5 g/dL (ref 6.0–8.3)

## 2014-02-03 LAB — LIPID PANEL
Cholesterol: 142 mg/dL (ref 0–200)
HDL: 37 mg/dL — ABNORMAL LOW (ref >39)
LDL Cholesterol: 72 mg/dL (ref 0–99)
Total CHOL/HDL Ratio: 3.8 Ratio
Triglycerides: 167 mg/dL — ABNORMAL HIGH (ref <150)
VLDL: 33 mg/dL (ref 0–40)

## 2014-02-03 LAB — CBC WITH DIFFERENTIAL/PLATELET
Basophils Absolute: 0.1 10*3/uL (ref 0.0–0.1)
Basophils Relative: 1 % (ref 0–1)
Eosinophils Absolute: 0.4 10*3/uL (ref 0.0–0.7)
Eosinophils Relative: 4 % (ref 0–5)
HCT: 48.4 % (ref 39.0–52.0)
Hemoglobin: 17.5 g/dL — ABNORMAL HIGH (ref 13.0–17.0)
Lymphocytes Relative: 28 % (ref 12–46)
Lymphs Abs: 3.1 10*3/uL (ref 0.7–4.0)
MCH: 33.3 pg (ref 26.0–34.0)
MCHC: 36.2 g/dL — ABNORMAL HIGH (ref 30.0–36.0)
MCV: 92 fL (ref 78.0–100.0)
Monocytes Absolute: 1.1 10*3/uL — ABNORMAL HIGH (ref 0.1–1.0)
Monocytes Relative: 10 % (ref 3–12)
Neutro Abs: 6.3 10*3/uL (ref 1.7–7.7)
Neutrophils Relative %: 57 % (ref 43–77)
Platelets: 285 10*3/uL (ref 150–400)
RBC: 5.26 MIL/uL (ref 4.22–5.81)
RDW: 13.9 % (ref 11.5–15.5)
WBC: 11 10*3/uL — ABNORMAL HIGH (ref 4.0–10.5)

## 2014-02-03 LAB — RPR

## 2014-02-03 LAB — PHOSPHORUS: Phosphorus: 2.9 mg/dL (ref 2.3–4.6)

## 2014-02-03 LAB — T-HELPER CELL (CD4) - (RCID CLINIC ONLY)
CD4 % Helper T Cell: 24 % — ABNORMAL LOW (ref 33–55)
CD4 T Cell Abs: 770 /uL (ref 400–2700)

## 2014-02-04 LAB — MICROALBUMIN / CREATININE URINE RATIO
Creatinine, Urine: 47 mg/dL
Microalb Creat Ratio: 10.6 mg/g (ref 0.0–30.0)
Microalb, Ur: 0.5 mg/dL (ref 0.00–1.89)

## 2014-02-05 LAB — HIV-1 RNA QUANT-NO REFLEX-BLD
HIV 1 RNA Quant: <20 copies/mL (ref <20)
HIV-1 RNA Quant, Log: <1.3 {Log} (ref <1.30)

## 2014-02-13 ENCOUNTER — Encounter: Payer: Self-pay | Admitting: Infectious Disease

## 2014-02-13 ENCOUNTER — Ambulatory Visit (INDEPENDENT_AMBULATORY_CARE_PROVIDER_SITE_OTHER): Payer: Medicare Other | Admitting: Infectious Disease

## 2014-02-13 VITALS — BP 124/72 | HR 85 | Temp 98.0°F | Wt 250.5 lb

## 2014-02-13 DIAGNOSIS — I10 Essential (primary) hypertension: Secondary | ICD-10-CM | POA: Diagnosis not present

## 2014-02-13 DIAGNOSIS — E669 Obesity, unspecified: Secondary | ICD-10-CM | POA: Diagnosis not present

## 2014-02-13 DIAGNOSIS — Z113 Encounter for screening for infections with a predominantly sexual mode of transmission: Secondary | ICD-10-CM

## 2014-02-13 DIAGNOSIS — R7309 Other abnormal glucose: Secondary | ICD-10-CM

## 2014-02-13 DIAGNOSIS — E785 Hyperlipidemia, unspecified: Secondary | ICD-10-CM

## 2014-02-13 DIAGNOSIS — R739 Hyperglycemia, unspecified: Secondary | ICD-10-CM

## 2014-02-13 DIAGNOSIS — B20 Human immunodeficiency virus [HIV] disease: Secondary | ICD-10-CM

## 2014-02-13 NOTE — Progress Notes (Signed)
  Subjective:    Patient ID: Hunter Pratt, male    DOB: July 05, 1956, 58 y.o.   MRN: [id number]  HPI   Hunter Pratt is a 58 y.o. male who is doing superbly well on his antiviral regimen, of intelence Isentress and Truvada with undetectable viral load and health cd4 count.  He is doing fairly well. He continues to have elevated BG and has gained weight.  I have asked him to try carb restricted diet to lose weight. He is not smoking.     Review of Systems  Constitutional: Negative for fever, chills, diaphoresis, activity change, appetite change, fatigue and unexpected weight change.  HENT: Negative for congestion, facial swelling, postnasal drip, rhinorrhea, sinus pressure, sneezing, sore throat and trouble swallowing.   Eyes: Negative for photophobia and visual disturbance.  Respiratory: Negative for cough, chest tightness, shortness of breath, wheezing and stridor.   Cardiovascular: Negative for chest pain, palpitations and leg swelling.  Gastrointestinal: Negative for nausea, vomiting, abdominal pain, diarrhea, constipation, blood in stool, abdominal distention and anal bleeding.  Genitourinary: Negative for dysuria, hematuria, flank pain and difficulty urinating.  Musculoskeletal: Negative for arthralgias, back pain, gait problem, joint swelling and myalgias.  Skin: Negative for color change, pallor, rash and wound.  Neurological: Negative for dizziness, tremors, weakness and light-headedness.  Hematological: Negative for adenopathy. Does not bruise/bleed easily.  Psychiatric/Behavioral: Negative for behavioral problems, confusion, sleep disturbance, dysphoric mood, decreased concentration and agitation.       Objective:   Physical Exam  Constitutional: He is oriented to person, place, and time. He appears well-developed and well-nourished. No distress.  HENT:  Head: Normocephalic and atraumatic.  Mouth/Throat: Oropharynx is clear and moist. No oropharyngeal exudate.  Eyes:  Conjunctivae and EOM are normal. Pupils are equal, round, and reactive to light. No scleral icterus.  Neck: Normal range of motion. Neck supple. No JVD present.  Cardiovascular: Normal rate, regular rhythm and normal heart sounds.  Exam reveals no gallop and no friction rub.   No murmur heard. Pulmonary/Chest: Effort normal and breath sounds normal. No respiratory distress. He has no wheezes. He has no rales. He exhibits no tenderness.  Abdominal: He exhibits no distension and no mass. There is no tenderness. There is no rebound and no guarding.  Musculoskeletal: He exhibits no edema and no tenderness.  Lymphadenopathy:    He has no cervical adenopathy.  Subtle cervical lymphadenopathy  Neurological: He is alert and oriented to person, place, and time. He has normal reflexes. He exhibits normal muscle tone. Coordination normal.  Skin: Skin is warm and dry. He is not diaphoretic. No erythema. No pallor.  Psychiatric: He has a normal mood and affect. His behavior is normal. Judgment and thought content normal.          Assessment & Plan:  HIV: continue current regimen of intelence, isentress and truvada  HTN:  continue toprol  Prediabetes: needs to lose weight, likely will needs metformin. Try carb restricted diet   Hyperlipidemia: LDL at goal

## 2014-02-25 ENCOUNTER — Other Ambulatory Visit: Payer: Self-pay | Admitting: Family Medicine

## 2014-02-27 ENCOUNTER — Other Ambulatory Visit: Payer: Self-pay | Admitting: Family Medicine

## 2014-02-27 NOTE — Telephone Encounter (Signed)
Refill appropriate and filled per protocol. 

## 2014-02-28 NOTE — Telephone Encounter (Signed)
?   OK to Refill  - Last ov 08/14/13

## 2014-02-28 NOTE — Telephone Encounter (Signed)
Okay to refill, give 3  

## 2014-03-01 NOTE — Telephone Encounter (Signed)
Prescription sent to pharmacy.

## 2014-03-03 ENCOUNTER — Ambulatory Visit (INDEPENDENT_AMBULATORY_CARE_PROVIDER_SITE_OTHER): Payer: Medicare Other | Admitting: Family Medicine

## 2014-03-03 ENCOUNTER — Encounter: Payer: Self-pay | Admitting: Family Medicine

## 2014-03-03 VITALS — BP 128/84 | HR 80 | Temp 98.1°F | Resp 16 | Ht 69.5 in | Wt 251.0 lb

## 2014-03-03 DIAGNOSIS — R609 Edema, unspecified: Secondary | ICD-10-CM

## 2014-03-03 DIAGNOSIS — R7309 Other abnormal glucose: Secondary | ICD-10-CM

## 2014-03-03 DIAGNOSIS — R600 Localized edema: Secondary | ICD-10-CM

## 2014-03-03 DIAGNOSIS — IMO0002 Reserved for concepts with insufficient information to code with codable children: Secondary | ICD-10-CM | POA: Diagnosis not present

## 2014-03-03 DIAGNOSIS — G4700 Insomnia, unspecified: Secondary | ICD-10-CM

## 2014-03-03 DIAGNOSIS — I10 Essential (primary) hypertension: Secondary | ICD-10-CM

## 2014-03-03 DIAGNOSIS — M1710 Unilateral primary osteoarthritis, unspecified knee: Secondary | ICD-10-CM

## 2014-03-03 DIAGNOSIS — E669 Obesity, unspecified: Secondary | ICD-10-CM

## 2014-03-03 DIAGNOSIS — R7302 Impaired glucose tolerance (oral): Secondary | ICD-10-CM

## 2014-03-03 LAB — HEMOGLOBIN A1C, FINGERSTICK: Hgb A1C (fingerstick): 5.9 % — ABNORMAL HIGH (ref <5.7)

## 2014-03-03 MED ORDER — DICLOFENAC SODIUM 1 % TD GEL: TRANSDERMAL | Status: DC

## 2014-03-03 NOTE — Assessment & Plan Note (Signed)
Blood pressure is well-controlled continue current medication he does have Lasix to be used as needed for leg swelling

## 2014-03-03 NOTE — Progress Notes (Signed)
Patient ID: Hunter Pratt, male   DOB: 1955/12/08, 58 y.o.   MRN: [id number]   Subjective:    Patient ID: Hunter Pratt, male    DOB: 04-Mar-1956, 58 y.o.   MRN: [id number]  Patient presents for 4 month F/U and B knee pain  patient to follow chronic medical problems. History of glucose intolerance hypertension HIV hyperlipidemia. He complains of bilateral knee pain he is known osteoarthritis and degenerative joint disease in knees. He is unable to take oral NSAID secondary to side effects and peptic ulcer disease. He has been using tramadol which helps some days. The colder weather causes more pain. He's not had any falls recently he's not had any swelling of his knees.  Reviewed his last note by his infectious disease physician his counts look good. His lipids were also reviewed it with the exception of minimally elevated triglycerides and slightly elevated fasting glucose the labs were stable. He is due for repeat A1c today. He has been checking his sugar most days and states that his fasting sugars are around 107. He has unfortunately gained some weight since her last visit do to decrease mobility during the winter.    Review Of Systems:  GEN- denies fatigue, fever, weight loss,weakness, recent illness HEENT- denies eye drainage, change in vision, nasal discharge, CVS- denies chest pain, palpitations RESP- denies SOB, cough, wheeze ABD- denies N/V, change in stools, abd pain GU- denies dysuria, hematuria, dribbling, incontinence MSK- + joint pain, muscle aches, injury Neuro- denies headache, dizziness, syncope, seizure activity       Objective:    BP 128/84  Pulse 80  Temp(Src) 98.1 F (36.7 C) (Oral)  Resp 16  Ht 5' 9.5" (1.765 m)  Wt 251 lb (113.853 kg)  BMI 36.55 kg/m2 GEN- NAD, alert and oriented x3 HEENT- PERRL, EOMI, non injected sclera, pink conjunctiva, MMM, oropharynx clear CVS- RRR, no murmur RESP-CTAB EXT- +pedal edema MSK- Bilateral knees fair  ROM, no effusion, no  erythema, +crepitus Pulses- Radial 2+, DP equal bilat Psych- normal affect and mood       Assessment & Plan:      Problem List Items Addressed This Visit   Obesity     He's gained some weight since her last visit we discussed low carb low fat diet handout given    Lower extremity edema     Lasix when necessary    Insomnia     Increase trazodone to 100mg     HTN (hypertension)     Blood pressure is well-controlled continue current medication he does have Lasix to be used as needed for leg swelling    Glucose intolerance (impaired glucose tolerance) - Primary     Check A1C  Discussed low carb diet    Relevant Orders      Hemoglobin A1C, fingerstick   DEGENERATIVE JOINT DISEASE, KNEES, BILATERAL     Since he is unable to take oral intake 2 history of peptic ulcer as well as side effects such as renal insufficiency I will try him on topical Voltaren gel and he can take one to 2 tablets of tramadol as needed    Relevant Medications      traMADol (ULTRAM) 50 MG tablet      Note: This dictation was prepared with Dragon dictation along with smaller phrase technology. Any transcriptional errors that result from this process are unintentional.

## 2014-03-03 NOTE — Assessment & Plan Note (Signed)
Increase trazodone to 100 mg

## 2014-03-03 NOTE — Assessment & Plan Note (Addendum)
His A1c was checked in office today was 5.9% which is an improvement over his previous. He still needs to work on low carb diet and weight loss but I would not start any metformin at this time. Discussed low carb diet

## 2014-03-03 NOTE — Assessment & Plan Note (Signed)
Lasix when necessary

## 2014-03-03 NOTE — Assessment & Plan Note (Signed)
He's gained some weight since her last visit we discussed low carb low fat diet handout given

## 2014-03-03 NOTE — Assessment & Plan Note (Signed)
Since he is unable to take oral intake 2 history of peptic ulcer as well as side effects such as renal insufficiency I will try him on topical Voltaren gel and he can take one to 2 tablets of tramadol as needed

## 2014-03-03 NOTE — Patient Instructions (Addendum)
Increase trazodone 100 mg at bedtime Try the Voltaren gel for your arthritis Continue all other medications Work on low carb low fat diet A1C is 5.9% Followup 4 months

## 2014-03-28 ENCOUNTER — Other Ambulatory Visit: Payer: Self-pay | Admitting: Family Medicine

## 2014-03-28 ENCOUNTER — Other Ambulatory Visit: Payer: Self-pay | Admitting: Infectious Disease

## 2014-03-29 ENCOUNTER — Other Ambulatory Visit: Payer: Self-pay | Admitting: Licensed Clinical Social Worker

## 2014-03-29 DIAGNOSIS — F0630 Mood disorder due to known physiological condition, unspecified: Secondary | ICD-10-CM

## 2014-03-29 DIAGNOSIS — F17200 Nicotine dependence, unspecified, uncomplicated: Secondary | ICD-10-CM

## 2014-03-29 DIAGNOSIS — Z21 Asymptomatic human immunodeficiency virus [HIV] infection status: Secondary | ICD-10-CM

## 2014-03-29 DIAGNOSIS — B20 Human immunodeficiency virus [HIV] disease: Secondary | ICD-10-CM

## 2014-03-29 DIAGNOSIS — Z113 Encounter for screening for infections with a predominantly sexual mode of transmission: Secondary | ICD-10-CM

## 2014-03-29 DIAGNOSIS — R739 Hyperglycemia, unspecified: Secondary | ICD-10-CM

## 2014-03-29 DIAGNOSIS — Z139 Encounter for screening, unspecified: Secondary | ICD-10-CM

## 2014-03-29 DIAGNOSIS — K219 Gastro-esophageal reflux disease without esophagitis: Secondary | ICD-10-CM

## 2014-03-29 DIAGNOSIS — F329 Major depressive disorder, single episode, unspecified: Secondary | ICD-10-CM

## 2014-03-29 DIAGNOSIS — F32A Depression, unspecified: Secondary | ICD-10-CM

## 2014-03-29 MED ORDER — RALTEGRAVIR POTASSIUM 400 MG PO TABS: 400.0000 mg | ORAL_TABLET | Freq: Two times a day (BID) | ORAL | Status: DC

## 2014-03-29 MED ORDER — INTELENCE 200 MG PO TABS: 200.0000 mg | ORAL_TABLET | Freq: Two times a day (BID) | ORAL | Status: DC

## 2014-03-29 MED ORDER — EMTRICITABINE-TENOFOVIR DF 200-300 MG PO TABS: 1.0000 | ORAL_TABLET | Freq: Every day | ORAL | Status: DC

## 2014-03-29 NOTE — Telephone Encounter (Signed)
Medication called to pharmacy.  Prescription sent to pharmacy.  

## 2014-03-29 NOTE — Telephone Encounter (Signed)
Okay to refill give 2 

## 2014-03-29 NOTE — Telephone Encounter (Signed)
Ok to refill Tramadol??  Last office visit 03/03/2014.   Last refill 12/27/2013.

## 2014-03-31 ENCOUNTER — Other Ambulatory Visit: Payer: Self-pay | Admitting: Infectious Disease

## 2014-03-31 ENCOUNTER — Other Ambulatory Visit: Payer: Self-pay | Admitting: Family Medicine

## 2014-04-03 ENCOUNTER — Other Ambulatory Visit: Payer: Self-pay | Admitting: *Deleted

## 2014-04-03 MED ORDER — GABAPENTIN 300 MG PO CAPS: ORAL_CAPSULE | ORAL | Status: DC

## 2014-04-04 ENCOUNTER — Other Ambulatory Visit: Payer: Self-pay | Admitting: Licensed Clinical Social Worker

## 2014-04-04 DIAGNOSIS — F17200 Nicotine dependence, unspecified, uncomplicated: Secondary | ICD-10-CM

## 2014-04-04 DIAGNOSIS — F32A Depression, unspecified: Secondary | ICD-10-CM

## 2014-04-04 DIAGNOSIS — F329 Major depressive disorder, single episode, unspecified: Secondary | ICD-10-CM

## 2014-04-04 DIAGNOSIS — B20 Human immunodeficiency virus [HIV] disease: Secondary | ICD-10-CM

## 2014-04-04 DIAGNOSIS — E7800 Pure hypercholesterolemia, unspecified: Secondary | ICD-10-CM

## 2014-04-04 DIAGNOSIS — K219 Gastro-esophageal reflux disease without esophagitis: Secondary | ICD-10-CM

## 2014-04-04 DIAGNOSIS — F0630 Mood disorder due to known physiological condition, unspecified: Secondary | ICD-10-CM

## 2014-04-04 DIAGNOSIS — Z113 Encounter for screening for infections with a predominantly sexual mode of transmission: Secondary | ICD-10-CM

## 2014-04-04 DIAGNOSIS — R739 Hyperglycemia, unspecified: Secondary | ICD-10-CM

## 2014-04-04 DIAGNOSIS — Z139 Encounter for screening, unspecified: Secondary | ICD-10-CM

## 2014-04-04 MED ORDER — FUROSEMIDE 20 MG PO TABS: 20.0000 mg | ORAL_TABLET | Freq: Every day | ORAL | Status: DC

## 2014-04-04 MED ORDER — RALTEGRAVIR POTASSIUM 400 MG PO TABS: 400.0000 mg | ORAL_TABLET | Freq: Two times a day (BID) | ORAL | Status: DC

## 2014-04-04 MED ORDER — INTELENCE 200 MG PO TABS: 200.0000 mg | ORAL_TABLET | Freq: Two times a day (BID) | ORAL | Status: DC

## 2014-04-04 MED ORDER — EMTRICITABINE-TENOFOVIR DF 200-300 MG PO TABS: 1.0000 | ORAL_TABLET | Freq: Every day | ORAL | Status: DC

## 2014-04-26 NOTE — Telephone Encounter (Signed)
noted 

## 2014-06-26 ENCOUNTER — Other Ambulatory Visit: Payer: Self-pay | Admitting: Infectious Disease

## 2014-06-29 ENCOUNTER — Other Ambulatory Visit: Payer: Self-pay | Admitting: Infectious Disease

## 2014-07-03 ENCOUNTER — Ambulatory Visit: Payer: Medicare Other

## 2014-07-03 ENCOUNTER — Encounter: Payer: Self-pay | Admitting: Family Medicine

## 2014-07-03 ENCOUNTER — Ambulatory Visit (INDEPENDENT_AMBULATORY_CARE_PROVIDER_SITE_OTHER): Payer: Medicare Other | Admitting: Family Medicine

## 2014-07-03 VITALS — BP 136/74 | HR 78 | Temp 97.9°F | Resp 14 | Ht 70.5 in | Wt 248.0 lb

## 2014-07-03 DIAGNOSIS — M1710 Unilateral primary osteoarthritis, unspecified knee: Secondary | ICD-10-CM | POA: Diagnosis not present

## 2014-07-03 DIAGNOSIS — G609 Hereditary and idiopathic neuropathy, unspecified: Secondary | ICD-10-CM

## 2014-07-03 DIAGNOSIS — R7302 Impaired glucose tolerance (oral): Secondary | ICD-10-CM

## 2014-07-03 DIAGNOSIS — E785 Hyperlipidemia, unspecified: Secondary | ICD-10-CM

## 2014-07-03 DIAGNOSIS — R600 Localized edema: Secondary | ICD-10-CM

## 2014-07-03 DIAGNOSIS — R7309 Other abnormal glucose: Secondary | ICD-10-CM

## 2014-07-03 DIAGNOSIS — I10 Essential (primary) hypertension: Secondary | ICD-10-CM | POA: Diagnosis not present

## 2014-07-03 DIAGNOSIS — IMO0002 Reserved for concepts with insufficient information to code with codable children: Secondary | ICD-10-CM

## 2014-07-03 DIAGNOSIS — B20 Human immunodeficiency virus [HIV] disease: Secondary | ICD-10-CM

## 2014-07-03 DIAGNOSIS — R609 Edema, unspecified: Secondary | ICD-10-CM

## 2014-07-03 MED ORDER — HYDROCODONE-ACETAMINOPHEN 5-325 MG PO TABS: 1.0000 | ORAL_TABLET | Freq: Four times a day (QID) | ORAL | Status: DC | PRN

## 2014-07-03 NOTE — Assessment & Plan Note (Signed)
Increase lasix to 40mg  once a day

## 2014-07-03 NOTE — Assessment & Plan Note (Signed)
Well controlled 

## 2014-07-03 NOTE — Assessment & Plan Note (Signed)
Currently stable on gabapentin

## 2014-07-03 NOTE — Progress Notes (Signed)
Patient ID: Hunter Pratt, male   DOB: Mar 05, 1956, 58 y.o.   MRN: [id number]   Subjective:    Patient ID: Hunter Pratt, male    DOB: 1956-10-28, 58 y.o.   MRN: [id number]  Patient presents for 4 month F/U and Edema  patient here to follow chronic medical problems. He continues to get some leg swelling when he takes 40 mg of Lasix this resolves the edema. He also continues to have bilateral knee pain her last visit he was noted to have mild osteoarthritis of his x-rays and 2010 I gave him tramadol to use this helps however he wears off before the end of the day this pain is worse at night after he is up and around. He occasionally gets swelling of the knees they also feel like they're going to give out on him and they crack and pot. He has not had any recent falls. He does occasionally take an Advil does he's had allergy to other NSAIDs as well as renal function with them  He is due to have labs by his infectious disease doctor next month which will include his cholesterol panel. His last A1c was excellent at 5.9% regarding glucose intolerance   Review Of Systems:  GEN- denies fatigue, fever, weight loss,weakness, recent illness HEENT- denies eye drainage, change in vision, nasal discharge, CVS- denies chest pain, palpitations RESP- denies SOB, cough, wheeze ABD- denies N/V, change in stools, abd pain GU- denies dysuria, hematuria, dribbling, incontinence MSK- denies joint pain, muscle aches, injury Neuro- denies headache, dizziness, syncope, seizure activity       Objective:    BP 136/74  Pulse 78  Temp(Src) 97.9 F (36.6 C) (Oral)  Resp 14  Ht 5' 10.5" (1.791 m)  Wt 248 lb (112.492 kg)  BMI 35.07 kg/m2 GEN- NAD, alert and oriented x3 HEENT- PERRL, EOMI, non injected sclera, pink conjunctiva, MMM, oropharynx clear Neck- Supple, no thyromegaly CVS- RRR, no murmur RESP-CTAB MSK- Bilat knees, normal inspection, no effusion, +crepitus, fair ROM EXT- trace pedal edema Pulses- Radial,  DP- 2+        Assessment & Plan:      Problem List Items Addressed This Visit   None      Note: This dictation was prepared with Dragon dictation along with smaller phrase technology. Any transcriptional errors that result from this process are unintentional.

## 2014-07-03 NOTE — Assessment & Plan Note (Signed)
Referral to orthopedics for evaluation and treatment, for now, Ultram during day, voltaren gel, hydrocodone at bedtime

## 2014-07-03 NOTE — Assessment & Plan Note (Signed)
Recheck lipids on pravastatin 20mg , goal LDL < 100

## 2014-07-03 NOTE — Assessment & Plan Note (Signed)
F/u per ID, labs in Sept, no recent infections

## 2014-07-03 NOTE — Assessment & Plan Note (Signed)
A1C looks good at 5.9%, continue to watch weight and diet

## 2014-07-03 NOTE — Patient Instructions (Signed)
Referral to orthopedics for your knees Take lasix 40ng once a day for swelling Continue current medications F/U 4 months

## 2014-07-10 DIAGNOSIS — M25569 Pain in unspecified knee: Secondary | ICD-10-CM | POA: Diagnosis not present

## 2014-07-17 ENCOUNTER — Encounter: Payer: Self-pay | Admitting: Gastroenterology

## 2014-07-26 ENCOUNTER — Other Ambulatory Visit: Payer: Self-pay | Admitting: Family Medicine

## 2014-07-26 NOTE — Telephone Encounter (Signed)
Medication called to pharmacy. 

## 2014-07-26 NOTE — Telephone Encounter (Signed)
Ok to refill??  Last office visit 07/03/2014.  Last refill 03/29/2014, #2 refills.

## 2014-07-26 NOTE — Telephone Encounter (Signed)
Approved for #60+2 additional refills 

## 2014-08-02 ENCOUNTER — Other Ambulatory Visit: Payer: Medicare Other

## 2014-08-02 DIAGNOSIS — B20 Human immunodeficiency virus [HIV] disease: Secondary | ICD-10-CM

## 2014-08-02 DIAGNOSIS — E785 Hyperlipidemia, unspecified: Secondary | ICD-10-CM

## 2014-08-02 LAB — COMPLETE METABOLIC PANEL WITH GFR
ALT: 16 U/L (ref 0–53)
AST: 17 U/L (ref 0–37)
Albumin: 3.9 g/dL (ref 3.5–5.2)
Alkaline Phosphatase: 110 U/L (ref 39–117)
BUN: 14 mg/dL (ref 6–23)
CO2: 24 mEq/L (ref 19–32)
Calcium: 9.3 mg/dL (ref 8.4–10.5)
Chloride: 103 mEq/L (ref 96–112)
Creat: 1.04 mg/dL (ref 0.50–1.35)
GFR, Est African American: >89 mL/min
GFR, Est Non African American: 80 mL/min
Glucose, Bld: 88 mg/dL (ref 70–99)
Potassium: 4.1 mEq/L (ref 3.5–5.3)
Sodium: 136 mEq/L (ref 135–145)
Total Bilirubin: 0.3 mg/dL (ref 0.2–1.2)
Total Protein: 7.4 g/dL (ref 6.0–8.3)

## 2014-08-02 LAB — CBC WITH DIFFERENTIAL/PLATELET
Basophils Absolute: 0.1 10*3/uL (ref 0.0–0.1)
Basophils Relative: 1 % (ref 0–1)
Eosinophils Absolute: 0.5 10*3/uL (ref 0.0–0.7)
Eosinophils Relative: 4 % (ref 0–5)
HCT: 48.3 % (ref 39.0–52.0)
Hemoglobin: 17.5 g/dL — ABNORMAL HIGH (ref 13.0–17.0)
Lymphocytes Relative: 31 % (ref 12–46)
Lymphs Abs: 3.5 10*3/uL (ref 0.7–4.0)
MCH: 33.6 pg (ref 26.0–34.0)
MCHC: 36.2 g/dL — ABNORMAL HIGH (ref 30.0–36.0)
MCV: 92.7 fL (ref 78.0–100.0)
Monocytes Absolute: 1 10*3/uL (ref 0.1–1.0)
Monocytes Relative: 9 % (ref 3–12)
Neutro Abs: 6.2 10*3/uL (ref 1.7–7.7)
Neutrophils Relative %: 55 % (ref 43–77)
Platelets: 301 10*3/uL (ref 150–400)
RBC: 5.21 MIL/uL (ref 4.22–5.81)
RDW: 13.7 % (ref 11.5–15.5)
WBC: 11.3 10*3/uL — ABNORMAL HIGH (ref 4.0–10.5)

## 2014-08-02 LAB — LIPID PANEL
Cholesterol: 119 mg/dL (ref 0–200)
HDL: 34 mg/dL — ABNORMAL LOW (ref >39)
LDL Cholesterol: 50 mg/dL (ref 0–99)
Total CHOL/HDL Ratio: 3.5 Ratio
Triglycerides: 174 mg/dL — ABNORMAL HIGH (ref <150)
VLDL: 35 mg/dL (ref 0–40)

## 2014-08-02 LAB — HEPATITIS C ANTIBODY: HCV Ab: NEGATIVE

## 2014-08-02 NOTE — Addendum Note (Signed)
Addended by: Dolan Amen D on: 08/02/2014 10:26 AM   Modules accepted: Orders

## 2014-08-03 LAB — T-HELPER CELL (CD4) - (RCID CLINIC ONLY)
CD4 % Helper T Cell: 27 % — ABNORMAL LOW (ref 33–55)
CD4 T Cell Abs: 920 /uL (ref 400–2700)

## 2014-08-04 LAB — HIV-1 RNA QUANT-NO REFLEX-BLD
HIV 1 RNA Quant: <20 copies/mL (ref <20)
HIV-1 RNA Quant, Log: <1.3 {Log} (ref <1.30)

## 2014-08-16 ENCOUNTER — Ambulatory Visit: Payer: Medicare Other | Admitting: Infectious Disease

## 2014-08-25 ENCOUNTER — Other Ambulatory Visit: Payer: Self-pay | Admitting: Infectious Disease

## 2014-08-25 ENCOUNTER — Other Ambulatory Visit: Payer: Self-pay | Admitting: Family Medicine

## 2014-08-25 NOTE — Telephone Encounter (Signed)
Refill appropriate and filled per protocol. 

## 2014-08-28 NOTE — Telephone Encounter (Signed)
Refill appropriate and filled per protocol. 

## 2014-08-29 ENCOUNTER — Other Ambulatory Visit: Payer: Self-pay | Admitting: *Deleted

## 2014-08-29 DIAGNOSIS — B20 Human immunodeficiency virus [HIV] disease: Secondary | ICD-10-CM

## 2014-08-29 MED ORDER — ETRAVIRINE 200 MG PO TABS: ORAL_TABLET | ORAL | Status: DC

## 2014-08-29 MED ORDER — EMTRICITABINE-TENOFOVIR DF 200-300 MG PO TABS: ORAL_TABLET | ORAL | Status: DC

## 2014-08-29 MED ORDER — RALTEGRAVIR POTASSIUM 400 MG PO TABS: ORAL_TABLET | ORAL | Status: DC

## 2014-08-29 NOTE — Telephone Encounter (Signed)
ADAP Application 

## 2014-09-11 ENCOUNTER — Ambulatory Visit (INDEPENDENT_AMBULATORY_CARE_PROVIDER_SITE_OTHER): Payer: Medicare Other | Admitting: Infectious Disease

## 2014-09-11 ENCOUNTER — Encounter: Payer: Self-pay | Admitting: Infectious Disease

## 2014-09-11 VITALS — BP 115/80 | HR 82 | Temp 98.0°F | Wt 243.0 lb

## 2014-09-11 DIAGNOSIS — B20 Human immunodeficiency virus [HIV] disease: Secondary | ICD-10-CM

## 2014-09-11 DIAGNOSIS — Z23 Encounter for immunization: Secondary | ICD-10-CM

## 2014-09-11 DIAGNOSIS — E785 Hyperlipidemia, unspecified: Secondary | ICD-10-CM

## 2014-09-11 NOTE — Progress Notes (Signed)
  Subjective:    Patient ID: Hunter Pratt, male    DOB: 12-10-1955, 58 y.o.   MRN: [id number]  HPI   Hunter Pratt is a 58 y.o. male who is doing superbly well on his antiviral regimen, of intelence Isentress and Truvada with undetectable viral load and health cd4 count.  Lab Results  Component Value Date   HIV1RNAQUANT <20 08/02/2014   Lab Results  Component Value Date   CD4TABS 920 08/02/2014   CD4TABS 770 02/03/2014   CD4TABS 750 07/13/2013      Review of Systems  Constitutional: Negative for fever, chills, diaphoresis, activity change, appetite change, fatigue and unexpected weight change.  HENT: Negative for congestion, facial swelling, postnasal drip, rhinorrhea, sinus pressure, sneezing, sore throat and trouble swallowing.   Eyes: Negative for photophobia and visual disturbance.  Respiratory: Negative for cough, chest tightness, shortness of breath, wheezing and stridor.   Cardiovascular: Negative for chest pain, palpitations and leg swelling.  Gastrointestinal: Negative for nausea, vomiting, abdominal pain, diarrhea, constipation, blood in stool, abdominal distention and anal bleeding.  Genitourinary: Negative for dysuria, hematuria, flank pain and difficulty urinating.  Musculoskeletal: Negative for arthralgias, back pain, gait problem, joint swelling and myalgias.  Skin: Negative for color change, pallor, rash and wound.  Neurological: Negative for dizziness, tremors, weakness and light-headedness.  Hematological: Negative for adenopathy. Does not bruise/bleed easily.  Psychiatric/Behavioral: Negative for behavioral problems, confusion, sleep disturbance, dysphoric mood, decreased concentration and agitation.       Objective:   Physical Exam  Constitutional: He is oriented to person, place, and time. He appears well-developed and well-nourished. No distress.  HENT:  Head: Normocephalic and atraumatic.  Mouth/Throat: Oropharynx is clear and moist. No oropharyngeal exudate.   Eyes: Conjunctivae and EOM are normal. Pupils are equal, round, and reactive to light. No scleral icterus.  Neck: Normal range of motion. Neck supple. No JVD present.  Cardiovascular: Normal rate, regular rhythm and normal heart sounds.  Exam reveals no gallop and no friction rub.   No murmur heard. Pulmonary/Chest: Effort normal and breath sounds normal. No respiratory distress. He has no wheezes. He has no rales. He exhibits no tenderness.  Abdominal: He exhibits no distension and no mass. There is no tenderness. There is no rebound and no guarding.  Musculoskeletal: He exhibits no edema and no tenderness.  Lymphadenopathy:    He has no cervical adenopathy.  Subtle cervical lymphadenopathy  Neurological: He is alert and oriented to person, place, and time. He has normal reflexes. He exhibits normal muscle tone. Coordination normal.  Skin: Skin is warm and dry. He is not diaphoretic. No erythema. No pallor.  Psychiatric: He has a normal mood and affect. His behavior is normal. Judgment and thought content normal.          Assessment & Plan:  HIV: continue current regimen of intelence, isentress and truvada  HTN:  continue toprol

## 2014-09-26 ENCOUNTER — Other Ambulatory Visit: Payer: Self-pay | Admitting: Family Medicine

## 2014-09-27 ENCOUNTER — Other Ambulatory Visit: Payer: Self-pay | Admitting: *Deleted

## 2014-09-27 DIAGNOSIS — E7800 Pure hypercholesterolemia, unspecified: Secondary | ICD-10-CM

## 2014-09-27 MED ORDER — PRAVASTATIN SODIUM 20 MG PO TABS: 20.0000 mg | ORAL_TABLET | Freq: Every day | ORAL | Status: DC

## 2014-09-27 NOTE — Telephone Encounter (Signed)
Refill appropriate and filled per protocol. 

## 2014-10-21 ENCOUNTER — Other Ambulatory Visit: Payer: Self-pay | Admitting: Family Medicine

## 2014-10-21 ENCOUNTER — Other Ambulatory Visit: Payer: Self-pay | Admitting: Physician Assistant

## 2014-10-21 ENCOUNTER — Other Ambulatory Visit: Payer: Self-pay | Admitting: Infectious Disease

## 2014-10-23 NOTE — Telephone Encounter (Signed)
Ok to refill??  Last office visit 07/03/2014.  Last refill 07/26/2014, #2 refills.

## 2014-10-23 NOTE — Telephone Encounter (Signed)
okay

## 2014-10-23 NOTE — Telephone Encounter (Signed)
Medication called to pharmacy. 

## 2014-10-24 NOTE — Telephone Encounter (Signed)
Refill appropriate and filled per protocol. 

## 2014-11-16 ENCOUNTER — Other Ambulatory Visit: Payer: Self-pay | Admitting: Infectious Disease

## 2014-11-16 ENCOUNTER — Other Ambulatory Visit: Payer: Self-pay | Admitting: Family Medicine

## 2014-11-16 NOTE — Telephone Encounter (Signed)
LRF 11/23 #60  LOV 07/03/14  Has appt in January.  OK refill?

## 2014-11-17 NOTE — Telephone Encounter (Signed)
rx called in

## 2014-11-17 NOTE — Telephone Encounter (Signed)
okay

## 2014-12-04 ENCOUNTER — Encounter: Payer: Self-pay | Admitting: Family Medicine

## 2014-12-04 ENCOUNTER — Ambulatory Visit (INDEPENDENT_AMBULATORY_CARE_PROVIDER_SITE_OTHER): Payer: Medicare Other | Admitting: Family Medicine

## 2014-12-04 VITALS — BP 128/72 | HR 78 | Temp 98.1°F | Resp 16 | Ht 70.0 in | Wt 245.0 lb

## 2014-12-04 DIAGNOSIS — G609 Hereditary and idiopathic neuropathy, unspecified: Secondary | ICD-10-CM | POA: Diagnosis not present

## 2014-12-04 DIAGNOSIS — I10 Essential (primary) hypertension: Secondary | ICD-10-CM

## 2014-12-04 DIAGNOSIS — E785 Hyperlipidemia, unspecified: Secondary | ICD-10-CM | POA: Diagnosis not present

## 2014-12-04 DIAGNOSIS — L309 Dermatitis, unspecified: Secondary | ICD-10-CM | POA: Diagnosis not present

## 2014-12-04 DIAGNOSIS — B20 Human immunodeficiency virus [HIV] disease: Secondary | ICD-10-CM

## 2014-12-04 DIAGNOSIS — R7302 Impaired glucose tolerance (oral): Secondary | ICD-10-CM | POA: Diagnosis not present

## 2014-12-04 LAB — BASIC METABOLIC PANEL
BUN: 11 mg/dL (ref 6–23)
CO2: 25 mEq/L (ref 19–32)
Calcium: 9.5 mg/dL (ref 8.4–10.5)
Chloride: 101 mEq/L (ref 96–112)
Creat: 1.07 mg/dL (ref 0.50–1.35)
Glucose, Bld: 167 mg/dL — ABNORMAL HIGH (ref 70–99)
Potassium: 4 mEq/L (ref 3.5–5.3)
Sodium: 137 mEq/L (ref 135–145)

## 2014-12-04 LAB — HEMOGLOBIN A1C
Hgb A1c MFr Bld: 6.3 % — ABNORMAL HIGH (ref <5.7)
Mean Plasma Glucose: 134 mg/dL — ABNORMAL HIGH (ref <117)

## 2014-12-04 MED ORDER — FLUOCINONIDE 0.05 % EX OINT: 1.0000 "application " | TOPICAL_OINTMENT | Freq: Two times a day (BID) | CUTANEOUS | Status: DC

## 2014-12-04 MED ORDER — FUROSEMIDE 20 MG PO TABS: 40.0000 mg | ORAL_TABLET | Freq: Every day | ORAL | Status: DC

## 2014-12-04 MED ORDER — METOPROLOL SUCCINATE ER 25 MG PO TB24: ORAL_TABLET | ORAL | Status: DC

## 2014-12-04 MED ORDER — TRAMADOL HCL 50 MG PO TABS: 50.0000 mg | ORAL_TABLET | Freq: Four times a day (QID) | ORAL | Status: DC | PRN

## 2014-12-04 MED ORDER — TRAZODONE HCL 50 MG PO TABS: 50.0000 mg | ORAL_TABLET | Freq: Every evening | ORAL | Status: DC | PRN

## 2014-12-04 MED ORDER — GABAPENTIN 300 MG PO CAPS: ORAL_CAPSULE | ORAL | Status: DC

## 2014-12-04 NOTE — Assessment & Plan Note (Signed)
TG were elevated some, continue to work on dietary changes and weight loss, continue statin

## 2014-12-04 NOTE — Patient Instructions (Signed)
Cream refilled for eczema Continue current medications We will call with A1C result for blood sugar F/U 4 months

## 2014-12-04 NOTE — Assessment & Plan Note (Signed)
Fluocinonide refilled prn

## 2014-12-04 NOTE — Assessment & Plan Note (Signed)
Recheck A1C, goal < 7% No current medications, monitor diet

## 2014-12-04 NOTE — Progress Notes (Signed)
Patient ID: Hunter Pratt, male   DOB: [birth year], 59 y.o.   MRN: [id number]   Subjective:    Patient ID: Hunter Pratt, male    DOB: Jul 27, 1956, 59 y.o.   MRN: [id number]  Patient presents for 4 month F/U  patient here to follow chronic medical process. He has no specific concerns today. He was seen by his infectious disease doctor recently there are no changes in his medication he states that his counts have been good. He states that he did have a bad viral illness but otherwise no other opportunistic infection since her last visit. He was also seen by orthopedics and had shots done in his knees and his pain has been much improved. His leg swelling has also been staying down he is still taking his Lasix as prescribed. He states that his scale at home states he is down to 238 pounds.    Review Of Systems:  GEN- denies fatigue, fever, weight loss,weakness, recent illness HEENT- denies eye drainage, change in vision, nasal discharge, CVS- denies chest pain, palpitations RESP- denies SOB, cough, wheeze ABD- denies N/V, change in stools, abd pain GU- denies dysuria, hematuria, dribbling, incontinence MSK- +joint pain, muscle aches, injury Neuro- denies headache, dizziness, syncope, seizure activity       Objective:    BP 128/72 mmHg  Pulse 78  Temp(Src) 98.1 F (36.7 C) (Oral)  Resp 16  Ht 5\' 10"  (1.778 m)  Wt 245 lb (111.131 kg)  BMI 35.15 kg/m2 GEN- NAD, alert and oriented x3 HEENT- PERRL, EOMI, non injected sclera, pink conjunctiva, MMM, oropharynx clear CVS- RRR, no murmur RESP-CTAB EXT- No edema Pulses- Radial, DP- 2+ Skin- eczematous rash right lower leg,       Assessment & Plan:      Problem List Items Addressed This Visit    None      Note: This dictation was prepared with Dragon dictation along with smaller phrase technology. Any transcriptional errors that result from this process are unintentional.

## 2014-12-04 NOTE — Assessment & Plan Note (Signed)
Well controlled 

## 2014-12-13 ENCOUNTER — Other Ambulatory Visit: Payer: Self-pay | Admitting: Family Medicine

## 2014-12-13 ENCOUNTER — Other Ambulatory Visit: Payer: Self-pay | Admitting: Infectious Disease

## 2014-12-13 ENCOUNTER — Other Ambulatory Visit (INDEPENDENT_AMBULATORY_CARE_PROVIDER_SITE_OTHER): Payer: Medicare Other

## 2014-12-13 DIAGNOSIS — E785 Hyperlipidemia, unspecified: Secondary | ICD-10-CM | POA: Diagnosis not present

## 2014-12-13 DIAGNOSIS — B20 Human immunodeficiency virus [HIV] disease: Secondary | ICD-10-CM

## 2014-12-13 LAB — LIPID PANEL
Cholesterol: 124 mg/dL (ref 0–200)
HDL: 33 mg/dL — ABNORMAL LOW (ref >39)
LDL Cholesterol: 52 mg/dL (ref 0–99)
Total CHOL/HDL Ratio: 3.8 Ratio
Triglycerides: 197 mg/dL — ABNORMAL HIGH (ref <150)
VLDL: 39 mg/dL (ref 0–40)

## 2014-12-13 LAB — COMPLETE METABOLIC PANEL WITH GFR
ALT: 17 U/L (ref 0–53)
AST: 19 U/L (ref 0–37)
Albumin: 4 g/dL (ref 3.5–5.2)
Alkaline Phosphatase: 113 U/L (ref 39–117)
BUN: 10 mg/dL (ref 6–23)
CO2: 21 mEq/L (ref 19–32)
Calcium: 9.7 mg/dL (ref 8.4–10.5)
Chloride: 103 mEq/L (ref 96–112)
Creat: 1 mg/dL (ref 0.50–1.35)
GFR, Est African American: >89 mL/min
GFR, Est Non African American: 83 mL/min
Glucose, Bld: 104 mg/dL — ABNORMAL HIGH (ref 70–99)
Potassium: 4.3 mEq/L (ref 3.5–5.3)
Sodium: 136 mEq/L (ref 135–145)
Total Bilirubin: 0.5 mg/dL (ref 0.2–1.2)
Total Protein: 7.9 g/dL (ref 6.0–8.3)

## 2014-12-13 LAB — CBC WITH DIFFERENTIAL/PLATELET
Basophils Absolute: 0.2 10*3/uL — ABNORMAL HIGH (ref 0.0–0.1)
Basophils Relative: 2 % — ABNORMAL HIGH (ref 0–1)
Eosinophils Absolute: 0.5 10*3/uL (ref 0.0–0.7)
Eosinophils Relative: 5 % (ref 0–5)
HCT: 51.6 % (ref 39.0–52.0)
Hemoglobin: 17.9 g/dL — ABNORMAL HIGH (ref 13.0–17.0)
Lymphocytes Relative: 31 % (ref 12–46)
Lymphs Abs: 3 10*3/uL (ref 0.7–4.0)
MCH: 32.5 pg (ref 26.0–34.0)
MCHC: 34.7 g/dL (ref 30.0–36.0)
MCV: 93.8 fL (ref 78.0–100.0)
MPV: 9.7 fL (ref 8.6–12.4)
Monocytes Absolute: 1 10*3/uL (ref 0.1–1.0)
Monocytes Relative: 10 % (ref 3–12)
Neutro Abs: 5 10*3/uL (ref 1.7–7.7)
Neutrophils Relative %: 52 % (ref 43–77)
Platelets: 293 10*3/uL (ref 150–400)
RBC: 5.5 MIL/uL (ref 4.22–5.81)
RDW: 14.1 % (ref 11.5–15.5)
WBC: 9.6 10*3/uL (ref 4.0–10.5)

## 2014-12-13 NOTE — Telephone Encounter (Signed)
Refill to out of town pharmacy for one month

## 2014-12-14 ENCOUNTER — Other Ambulatory Visit: Payer: Self-pay | Admitting: Infectious Disease

## 2014-12-14 DIAGNOSIS — K219 Gastro-esophageal reflux disease without esophagitis: Secondary | ICD-10-CM

## 2014-12-14 LAB — T-HELPER CELL (CD4) - (RCID CLINIC ONLY)
CD4 % Helper T Cell: 25 % — ABNORMAL LOW (ref 33–55)
CD4 T Cell Abs: 770 /uL (ref 400–2700)

## 2014-12-14 LAB — HIV-1 RNA QUANT-NO REFLEX-BLD
HIV 1 RNA Quant: <20 copies/mL (ref <20)
HIV-1 RNA Quant, Log: <1.3 {Log} (ref <1.30)

## 2014-12-14 LAB — RPR

## 2014-12-20 ENCOUNTER — Other Ambulatory Visit: Payer: Self-pay | Admitting: Family Medicine

## 2014-12-20 MED ORDER — GABAPENTIN 300 MG PO CAPS: 300.0000 mg | ORAL_CAPSULE | Freq: Every day | ORAL | Status: DC

## 2014-12-20 NOTE — Telephone Encounter (Signed)
Refill appropriate and filled per protocol. 

## 2014-12-27 ENCOUNTER — Ambulatory Visit: Payer: Medicare Other

## 2014-12-27 ENCOUNTER — Ambulatory Visit (INDEPENDENT_AMBULATORY_CARE_PROVIDER_SITE_OTHER): Payer: Medicare Other | Admitting: Infectious Disease

## 2014-12-27 ENCOUNTER — Encounter: Payer: Self-pay | Admitting: Infectious Disease

## 2014-12-27 DIAGNOSIS — B20 Human immunodeficiency virus [HIV] disease: Secondary | ICD-10-CM | POA: Diagnosis not present

## 2014-12-27 NOTE — Progress Notes (Signed)
  Subjective:    Patient ID: Hunter Pratt, male    DOB: Oct 31, 1956, 59 y.o.   MRN: [id number]  HPI   Hunter Pratt is a 59 y.o. male who is doing superbly well on his antiviral regimen, of intelence Isentress and Truvada with undetectable viral load and health cd4 count.  Lab Results  Component Value Date   HIV1RNAQUANT <20 12/13/2014   Lab Results  Component Value Date   CD4TABS 770 12/13/2014   CD4TABS 920 08/02/2014   CD4TABS 770 02/03/2014      Review of Systems  Constitutional: Negative for fever, chills, diaphoresis, activity change, appetite change, fatigue and unexpected weight change.  HENT: Negative for congestion, facial swelling, postnasal drip, rhinorrhea, sinus pressure, sneezing, sore throat and trouble swallowing.   Eyes: Negative for photophobia and visual disturbance.  Respiratory: Negative for cough, chest tightness, shortness of breath, wheezing and stridor.   Cardiovascular: Negative for chest pain, palpitations and leg swelling.  Gastrointestinal: Negative for nausea, vomiting, abdominal pain, diarrhea, constipation, blood in stool, abdominal distention and anal bleeding.  Genitourinary: Negative for dysuria, hematuria, flank pain and difficulty urinating.  Musculoskeletal: Negative for myalgias, back pain, joint swelling, arthralgias and gait problem.  Skin: Negative for color change, pallor, rash and wound.  Neurological: Negative for dizziness, tremors, weakness and light-headedness.  Hematological: Negative for adenopathy. Does not bruise/bleed easily.  Psychiatric/Behavioral: Negative for behavioral problems, confusion, sleep disturbance, dysphoric mood, decreased concentration and agitation.       Objective:   Physical Exam  Constitutional: He is oriented to person, place, and time. He appears well-developed and well-nourished. No distress.  HENT:  Head: Normocephalic and atraumatic.  Mouth/Throat: Oropharynx is clear and moist. No oropharyngeal  exudate.  Eyes: Conjunctivae and EOM are normal. Pupils are equal, round, and reactive to light. No scleral icterus.  Neck: Normal range of motion. Neck supple. No JVD present.  Cardiovascular: Normal rate, regular rhythm and normal heart sounds.  Exam reveals no gallop and no friction rub.   No murmur heard. Pulmonary/Chest: Effort normal and breath sounds normal. No respiratory distress. He has no wheezes. He has no rales. He exhibits no tenderness.  Abdominal: He exhibits no distension and no mass. There is no tenderness. There is no rebound and no guarding.  Musculoskeletal: He exhibits no edema or tenderness.  Lymphadenopathy:    He has no cervical adenopathy.  Subtle cervical lymphadenopathy  Neurological: He is alert and oriented to person, place, and time. He has normal reflexes. He exhibits normal muscle tone. Coordination normal.  Skin: Skin is warm and dry. He is not diaphoretic. No erythema. No pallor.  Psychiatric: He has a normal mood and affect. His behavior is normal. Judgment and thought content normal.          Assessment & Plan:  HIV: continue current regimen of intelence, isentress and truvada. Renew ADAP  HTN:  continue toprol

## 2014-12-28 ENCOUNTER — Other Ambulatory Visit: Payer: Self-pay | Admitting: *Deleted

## 2014-12-28 DIAGNOSIS — B20 Human immunodeficiency virus [HIV] disease: Secondary | ICD-10-CM

## 2014-12-28 MED ORDER — INTELENCE 200 MG PO TABS: 1.0000 | ORAL_TABLET | Freq: Two times a day (BID) | ORAL | Status: DC

## 2014-12-28 MED ORDER — RALTEGRAVIR POTASSIUM 400 MG PO TABS: ORAL_TABLET | ORAL | Status: DC

## 2014-12-28 MED ORDER — EMTRICITABINE-TENOFOVIR DF 200-300 MG PO TABS: ORAL_TABLET | ORAL | Status: DC

## 2015-01-03 ENCOUNTER — Encounter (HOSPITAL_COMMUNITY): Payer: Self-pay

## 2015-01-03 ENCOUNTER — Emergency Department (HOSPITAL_COMMUNITY)
Admission: EM | Admit: 2015-01-03 | Discharge: 2015-01-03 | Disposition: A | Discharge status: Home / Self Care | Payer: Medicare Other | Attending: Emergency Medicine | Admitting: Emergency Medicine

## 2015-01-03 DIAGNOSIS — Z79899 Other long term (current) drug therapy: Secondary | ICD-10-CM | POA: Insufficient documentation

## 2015-01-03 DIAGNOSIS — Z87442 Personal history of urinary calculi: Secondary | ICD-10-CM | POA: Insufficient documentation

## 2015-01-03 DIAGNOSIS — K219 Gastro-esophageal reflux disease without esophagitis: Secondary | ICD-10-CM | POA: Diagnosis not present

## 2015-01-03 DIAGNOSIS — Z87891 Personal history of nicotine dependence: Secondary | ICD-10-CM | POA: Diagnosis not present

## 2015-01-03 DIAGNOSIS — Z791 Long term (current) use of non-steroidal anti-inflammatories (NSAID): Secondary | ICD-10-CM | POA: Diagnosis not present

## 2015-01-03 DIAGNOSIS — M1990 Unspecified osteoarthritis, unspecified site: Secondary | ICD-10-CM | POA: Insufficient documentation

## 2015-01-03 DIAGNOSIS — E785 Hyperlipidemia, unspecified: Secondary | ICD-10-CM | POA: Diagnosis not present

## 2015-01-03 DIAGNOSIS — B20 Human immunodeficiency virus [HIV] disease: Secondary | ICD-10-CM | POA: Insufficient documentation

## 2015-01-03 DIAGNOSIS — R21 Rash and other nonspecific skin eruption: Secondary | ICD-10-CM | POA: Diagnosis not present

## 2015-01-03 DIAGNOSIS — Z8679 Personal history of other diseases of the circulatory system: Secondary | ICD-10-CM | POA: Insufficient documentation

## 2015-01-03 DIAGNOSIS — T7840XA Allergy, unspecified, initial encounter: Secondary | ICD-10-CM

## 2015-01-03 DIAGNOSIS — Z7952 Long term (current) use of systemic steroids: Secondary | ICD-10-CM | POA: Diagnosis not present

## 2015-01-03 DIAGNOSIS — Z8669 Personal history of other diseases of the nervous system and sense organs: Secondary | ICD-10-CM | POA: Diagnosis not present

## 2015-01-03 MED ORDER — METHYLPREDNISOLONE SODIUM SUCC 125 MG IJ SOLR
125.0000 mg | Freq: Once | INTRAMUSCULAR | Status: AC
  Administered 2015-01-03: 125 mg via INTRAVENOUS
  Filled 2015-01-03: qty 2

## 2015-01-03 MED ORDER — DIPHENHYDRAMINE HCL 50 MG/ML IJ SOLN
50.0000 mg | Freq: Once | INTRAMUSCULAR | Status: AC
  Administered 2015-01-03: 50 mg via INTRAVENOUS
  Filled 2015-01-03: qty 1

## 2015-01-03 MED ORDER — FAMOTIDINE IN NACL 20-0.9 MG/50ML-% IV SOLN
20.0000 mg | Freq: Once | INTRAVENOUS | Status: AC
  Administered 2015-01-03: 20 mg via INTRAVENOUS
  Filled 2015-01-03: qty 50

## 2015-01-03 MED ORDER — FAMOTIDINE 20 MG PO TABS: 20.0000 mg | ORAL_TABLET | Freq: Two times a day (BID) | ORAL | Status: DC

## 2015-01-03 MED ORDER — PREDNISONE 20 MG PO TABS: ORAL_TABLET | ORAL | Status: DC

## 2015-01-03 NOTE — ED Notes (Signed)
Onset of itchy rash onset 11pm

## 2015-01-03 NOTE — ED Provider Notes (Signed)
CSN: [id number]     Arrival date & time 01/03/15  0508 History   First MD Initiated Contact with Patient 01/03/15 [phone]     Chief Complaint  Patient presents with  . Allergic Reaction     (Consider location/radiation/quality/duration/timing/severity/associated sxs/prior Treatment) Patient is a 59 y.o. male presenting with allergic reaction. The history is provided by the patient (the pt started with a puritic rash to his left hand chest and legs and abd. today).  Allergic Reaction Presenting symptoms: itching and rash   Presenting symptoms: no difficulty breathing   Severity:  Moderate Prior allergic episodes:  No prior episodes Context: no animal exposure   Relieved by:  Nothing Worsened by:  Nothing tried Ineffective treatments:  None tried   Past Medical History  Diagnosis Date  . HIV infection   . Arthritis   . GERD (gastroesophageal reflux disease)   . Kidney stone   . PUD (peptic ulcer disease)   . DDD (degenerative disc disease), cervical   . OA (osteoarthritis) of knee   . Hearing loss     Bilateral  . Hyperlipidemia   . Systolic dysfunction     EF 40-45%   Past Surgical History  Procedure Laterality Date  . Cervical discectomy  1990 and 1991   Family History  Problem Relation Age of Onset  . Hypertension Mother   . Osteoporosis Mother   . Osteoporosis Father   . Depression Father   . Hearing loss Father   . Osteoporosis Sister   . Arthritis Sister   . Hypertension Brother   . Hyperlipidemia Brother   . Depression Brother   . Colon cancer Neg Hx    History  Substance Use Topics  . Smoking status: Former Smoker    Types: Cigarettes  . Smokeless tobacco: Never Used  . Alcohol Use: No    Review of Systems  Constitutional: Negative for appetite change and fatigue.  HENT: Negative for congestion, ear discharge and sinus pressure.   Eyes: Negative for discharge.  Respiratory: Negative for cough.   Cardiovascular: Negative for chest pain.   Gastrointestinal: Negative for abdominal pain and diarrhea.  Genitourinary: Negative for frequency and hematuria.  Musculoskeletal: Negative for back pain.  Skin: Positive for itching and rash.  Neurological: Negative for seizures and headaches.  Psychiatric/Behavioral: Negative for hallucinations.      Allergies  Naproxen sodium; Nsaids; and Other  Home Medications   Prior to Admission medications   Medication Sig Start Date End Date Taking? Authorizing Provider  diphenhydrAMINE (SOMINEX) 25 MG tablet Take 50 mg by mouth at bedtime as needed for sleep.   Yes Historical Provider, MD  diclofenac sodium (VOLTAREN) 1 % GEL Apply to affected areas three times a day as needed 03/03/14   Alycia Rossetti, MD  emtricitabine-tenofovir (TRUVADA) 200-300 MG per tablet TAKE 1 TABLET BY MOUTH DAILY 12/28/14   Thayer Headings, MD  famotidine (PEPCID) 20 MG tablet Take 1 tablet (20 mg total) by mouth 2 (two) times daily. 01/03/15   Maudry Diego, MD  fluocinonide ointment (LIDEX) 7.86 % Apply 1 application topically 2 (two) times daily. 12/04/14   Alycia Rossetti, MD  furosemide (LASIX) 20 MG tablet Take 2 tablets (40 mg total) by mouth daily. 12/04/14   Alycia Rossetti, MD  gabapentin (NEURONTIN) 300 MG capsule Take 1 capsule (300 mg total) by mouth at bedtime. 12/20/14   Alycia Rossetti, MD  INTELENCE 200 MG TABS Take 1 tablet (200 mg total) by  mouth 2 (two) times daily. 12/28/14   Thayer Headings, MD  metoprolol succinate (TOPROL-XL) 25 MG 24 hr tablet TAKE 1/2 TABLET BY MOUTH EVERY DAY AS NEEDED FOR BLOOD PRESSURE 12/04/14   Alycia Rossetti, MD  omeprazole (PRILOSEC) 20 MG capsule TAKE 2 CAPSULES BY MOUTH EVERY DAY 12/14/14   Truman Hayward, MD  pravastatin (PRAVACHOL) 20 MG tablet Take 1 tablet (20 mg total) by mouth daily. 09/27/14   Truman Hayward, MD  predniSONE (DELTASONE) 20 MG tablet 2 tabs po daily x 3 days 01/03/15   Maudry Diego, MD  raltegravir (ISENTRESS) 400 MG tablet TAKE 1 TABLET  BY MOUTH TWICE DAILY 12/28/14   Thayer Headings, MD  traMADol (ULTRAM) 50 MG tablet Take 1 tablet (50 mg total) by mouth every 6 (six) hours as needed. for pain 12/04/14   Alycia Rossetti, MD  traZODone (DESYREL) 50 MG tablet Take 1 tablet (50 mg total) by mouth at bedtime as needed. for sleep 12/04/14   Alycia Rossetti, MD   BP 114/76 mmHg  Pulse 98  Temp(Src) 97.9 F (36.6 C) (Oral)  Resp 20  Ht 5\' 11"  (1.803 m)  Wt 242 lb (109.77 kg)  BMI 33.77 kg/m2  SpO2 92% Physical Exam  Constitutional: He is oriented to person, place, and time. He appears well-developed.  HENT:  Head: Normocephalic.  Eyes: Conjunctivae and EOM are normal. No scleral icterus.  Neck: Neck supple. No thyromegaly present.  Cardiovascular: Normal rate and regular rhythm.  Exam reveals no gallop and no friction rub.   No murmur heard. Pulmonary/Chest: No stridor. He has no wheezes. He has no rales. He exhibits no tenderness.  Abdominal: He exhibits no distension. There is no tenderness. There is no rebound.  Musculoskeletal: Normal range of motion. He exhibits no edema.  Lymphadenopathy:    He has no cervical adenopathy.  Neurological: He is oriented to person, place, and time. He exhibits normal muscle tone. Coordination normal.  Skin: Rash noted. There is erythema.  Pt has a confluent rash to left palm.  Right thigh and smaller areas on chest and abd  Psychiatric: He has a normal mood and affect. His behavior is normal.    ED Course  Procedures (including critical care time) Labs Review Labs Reviewed - No data to display  Imaging Review No results found.   EKG Interpretation None      MDM   Final diagnoses:  Allergic reaction, initial encounter    Dermatitis.  Rash improved some with er tx.  Pt given prednisone, pepcid and told to take benadryl and follow up with his md in 2 days    Maudry Diego, MD 01/03/15 [phone]

## 2015-01-03 NOTE — Discharge Instructions (Signed)
Take benadryl every 6 hours for itching and rash.    Follow up this week with your md for recheck

## 2015-01-04 ENCOUNTER — Ambulatory Visit (INDEPENDENT_AMBULATORY_CARE_PROVIDER_SITE_OTHER): Payer: Medicare Other | Admitting: Internal Medicine

## 2015-01-04 ENCOUNTER — Encounter: Payer: Self-pay | Admitting: Internal Medicine

## 2015-01-04 VITALS — BP 118/79 | HR 82 | Temp 98.8°F | Wt 249.0 lb

## 2015-01-04 DIAGNOSIS — R21 Rash and other nonspecific skin eruption: Secondary | ICD-10-CM

## 2015-01-04 DIAGNOSIS — B20 Human immunodeficiency virus [HIV] disease: Secondary | ICD-10-CM | POA: Diagnosis not present

## 2015-01-04 NOTE — Progress Notes (Signed)
Patient ID: Hunter Pratt, male   DOB: 1956/06/06, 59 y.o.   MRN: [id number]          Patient Active Problem List   Diagnosis Date Noted  . Insomnia 03/03/2014  . Cervical spondylosis with radiculopathy 09/14/2013  . Pain in joint, shoulder region 08/11/2013  . Wrist pain 08/11/2013  . DDD (degenerative disc disease), cervical 08/11/2013  . Grief reaction 08/03/2013  . HTN (hypertension) 07/27/2013  . Back pain 05/03/2013  . Enlarged lymph node 03/04/2013  . Eczematous dermatitis 11/14/2012  . Obesity 11/14/2012  . Systolic dysfunction 63/12/6008  . Glucose intolerance (impaired glucose tolerance) 07/30/2012  . Lower extremity edema 06/09/2012  . OSA (obstructive sleep apnea) 06/09/2012  . METHICILLIN RESISTANT STAPHYLOCOCCUS AUREUS INFECTION 10/04/2009  . DEGENERATIVE JOINT DISEASE, KNEES, BILATERAL 01/30/2009  . Hyperlipidemia 04/21/2008  . GERD 04/21/2008  . MOOD DISORDER IN CONDITIONS CLASSIFIED ELSEWHERE 02/28/2008  . Hereditary and idiopathic peripheral neuropathy 02/28/2008  . PUD 01/03/2008  . TOBACCO USER 09/29/2007  . Human immunodeficiency virus (HIV) disease 12/28/2006    Patient's Medications  New Prescriptions   No medications on file  Previous Medications   DICLOFENAC SODIUM (VOLTAREN) 1 % GEL    Apply to affected areas three times a day as needed   DIPHENHYDRAMINE (SOMINEX) 25 MG TABLET    Take 50 mg by mouth at bedtime as needed for sleep.   EMTRICITABINE-TENOFOVIR (TRUVADA) 200-300 MG PER TABLET    TAKE 1 TABLET BY MOUTH DAILY   FAMOTIDINE (PEPCID) 20 MG TABLET    Take 1 tablet (20 mg total) by mouth 2 (two) times daily.   FLUOCINONIDE OINTMENT (LIDEX) 0.05 %    Apply 1 application topically 2 (two) times daily.   FUROSEMIDE (LASIX) 20 MG TABLET    Take 2 tablets (40 mg total) by mouth daily.   GABAPENTIN (NEURONTIN) 300 MG CAPSULE    Take 1 capsule (300 mg total) by mouth at bedtime.   INTELENCE 200 MG TABS    Take 1 tablet (200 mg total) by mouth 2  (two) times daily.   METOPROLOL SUCCINATE (TOPROL-XL) 25 MG 24 HR TABLET    TAKE 1/2 TABLET BY MOUTH EVERY DAY AS NEEDED FOR BLOOD PRESSURE   OMEPRAZOLE (PRILOSEC) 20 MG CAPSULE    TAKE 2 CAPSULES BY MOUTH EVERY DAY   PRAVASTATIN (PRAVACHOL) 20 MG TABLET    Take 1 tablet (20 mg total) by mouth daily.   PREDNISONE (DELTASONE) 20 MG TABLET    2 tabs po daily x 3 days   RALTEGRAVIR (ISENTRESS) 400 MG TABLET    TAKE 1 TABLET BY MOUTH TWICE DAILY   TRAMADOL (ULTRAM) 50 MG TABLET    Take 1 tablet (50 mg total) by mouth every 6 (six) hours as needed. for pain   TRAZODONE (DESYREL) 50 MG TABLET    Take 1 tablet (50 mg total) by mouth at bedtime as needed. for sleep  Modified Medications   No medications on file  Discontinued Medications   No medications on file    Subjective: Hunter Pratt is seen on a work in basis for ED follow-up. He is followed by my partner, Dr. Tommy Medal for his HIV infection. He has been on Truvada, Intelence and Isentress for many years with good results. 2 days ago he developed sudden pruritic erythematous rash on his left lower leg, bilateral groin areas, arms palms and chest. He was seen in the emergency department and started on a three-day course of prednisone. Famotidine  was added to the omeprazole and diphenhydramine he was already taking. He states that his only new medication prior to developing the rash was Advil. He took 3 Advil shortly before the rash broke out for his knee pain. He has a history of an eczematous type rash most commonly occurs on his right lower leg. He uses topical steroids for that. He also has a history of previous pruritic rashes. He had one several years ago that he thought might be due to a food allergy. He has seen Dr. Midge Minium in the past and states he's had cutaneous allergy testing and is allergic to a variety of things including "black dyes". He is feeling much better since starting the prednisone and famotidine.   Review of Systems: Pertinent  items are noted in HPI.  Past Medical History  Diagnosis Date  . HIV infection   . Arthritis   . GERD (gastroesophageal reflux disease)   . Kidney stone   . PUD (peptic ulcer disease)   . DDD (degenerative disc disease), cervical   . OA (osteoarthritis) of knee   . Hearing loss     Bilateral  . Hyperlipidemia   . Systolic dysfunction     EF 40-45%    History  Substance Use Topics  . Smoking status: Former Smoker    Types: Cigarettes  . Smokeless tobacco: Never Used  . Alcohol Use: No    Family History  Problem Relation Age of Onset  . Hypertension Mother   . Osteoporosis Mother   . Osteoporosis Father   . Depression Father   . Hearing loss Father   . Osteoporosis Sister   . Arthritis Sister   . Hypertension Brother   . Hyperlipidemia Brother   . Depression Brother   . Colon cancer Neg Hx     Allergies  Allergen Reactions  . Naproxen Sodium     REACTION: kidney failure  . Nsaids     REACTION: GI upset  . Other Rash    Black dye    Objective: Temp: 98.8 F (37.1 C) (02/04 1553) Temp Source: Oral (02/04 1553) BP: 118/79 mmHg (02/04 1553) Pulse Rate: 82 (02/04 1553) Body mass index is 34.74 kg/(m^2).  General: He is in no distress Oral: No oropharyngeal lesions. Full dentures Eyes: Normal external exam Skin: Or chronic appearing erythematous and scaly rashes on the posterior areas of both lower legs that he states are his eczema. He has fading erythema in his bilateral groin areas, palms and chest Lungs: Clear Cor: Regular S1 and S2 no murmur   Lab Results Lab Results  Component Value Date   WBC 9.6 12/13/2014   HGB 17.9* 12/13/2014   HCT 51.6 12/13/2014   MCV 93.8 12/13/2014   PLT 293 12/13/2014    Lab Results  Component Value Date   CREATININE 1.00 12/13/2014   BUN 10 12/13/2014   NA 136 12/13/2014   K 4.3 12/13/2014   CL 103 12/13/2014   CO2 21 12/13/2014    Lab Results  Component Value Date   ALT 17 12/13/2014   AST 19 12/13/2014    ALKPHOS 113 12/13/2014   BILITOT 0.5 12/13/2014    Lab Results  Component Value Date   CHOL 124 12/13/2014   HDL 33* 12/13/2014   LDLCALC 52 12/13/2014   TRIG 197* 12/13/2014   CHOLHDL 3.8 12/13/2014    Lab Results HIV 1 RNA QUANT (copies/mL)  Date Value  12/13/2014 <20  08/02/2014 <20  02/03/2014 <20  CD4 T CELL ABS (/uL)  Date Value  12/13/2014 770  08/02/2014 920  02/03/2014 770     Assessment: I'm not absolutely certain what is caused his pruritic rash. He has a history of similar outbreaks. It is improving with a brief course of steroids and antihistamines. Very unlikely that it is related to his antiretroviral regimen.  Plan: 1. Continue current antiretroviral regimen 2. He has follow-up with Dr. Buelah Manis, his primary care physician, next week 3. Follow-up here with Dr. Tommy Medal in June   Michel Bickers, [place] [facility] [phone] pager   [phone] cell 01/04/2015, 4:20 PM

## 2015-01-08 ENCOUNTER — Inpatient Hospital Stay: Payer: Medicare Other | Admitting: Family Medicine

## 2015-02-12 ENCOUNTER — Other Ambulatory Visit: Payer: Self-pay | Admitting: Family Medicine

## 2015-02-12 NOTE — Telephone Encounter (Signed)
Okay to refill meds 

## 2015-02-12 NOTE — Telephone Encounter (Signed)
LRF Tramadol 1/4 #60 + 2.  LOV 12/04/14  OK refill?

## 2015-02-13 NOTE — Telephone Encounter (Signed)
Medication refilled per protocol. 

## 2015-03-13 ENCOUNTER — Other Ambulatory Visit: Payer: Self-pay | Admitting: Internal Medicine

## 2015-03-14 ENCOUNTER — Other Ambulatory Visit: Payer: Self-pay | Admitting: *Deleted

## 2015-03-14 DIAGNOSIS — B20 Human immunodeficiency virus [HIV] disease: Secondary | ICD-10-CM

## 2015-03-14 MED ORDER — RALTEGRAVIR POTASSIUM 400 MG PO TABS: ORAL_TABLET | ORAL | Status: DC

## 2015-03-14 MED ORDER — EMTRICITABINE-TENOFOVIR DF 200-300 MG PO TABS: ORAL_TABLET | ORAL | Status: DC

## 2015-03-14 MED ORDER — INTELENCE 200 MG PO TABS: 1.0000 | ORAL_TABLET | Freq: Two times a day (BID) | ORAL | Status: DC

## 2015-04-06 ENCOUNTER — Ambulatory Visit (INDEPENDENT_AMBULATORY_CARE_PROVIDER_SITE_OTHER): Payer: Medicare Other | Admitting: Family Medicine

## 2015-04-06 ENCOUNTER — Encounter: Payer: Self-pay | Admitting: Family Medicine

## 2015-04-06 VITALS — BP 122/76 | HR 78 | Temp 98.9°F | Resp 14 | Ht 71.0 in | Wt 248.0 lb

## 2015-04-06 DIAGNOSIS — E669 Obesity, unspecified: Secondary | ICD-10-CM | POA: Diagnosis not present

## 2015-04-06 DIAGNOSIS — I10 Essential (primary) hypertension: Secondary | ICD-10-CM | POA: Diagnosis not present

## 2015-04-06 DIAGNOSIS — H6121 Impacted cerumen, right ear: Secondary | ICD-10-CM | POA: Diagnosis not present

## 2015-04-06 DIAGNOSIS — R7302 Impaired glucose tolerance (oral): Secondary | ICD-10-CM | POA: Diagnosis not present

## 2015-04-06 DIAGNOSIS — E785 Hyperlipidemia, unspecified: Secondary | ICD-10-CM

## 2015-04-06 LAB — LIPID PANEL
Cholesterol: 119 mg/dL (ref 0–200)
HDL: 27 mg/dL — ABNORMAL LOW (ref >=40)
LDL Cholesterol: 51 mg/dL (ref 0–99)
Total CHOL/HDL Ratio: 4.4 Ratio
Triglycerides: 205 mg/dL — ABNORMAL HIGH (ref <150)
VLDL: 41 mg/dL — ABNORMAL HIGH (ref 0–40)

## 2015-04-06 LAB — HEMOGLOBIN A1C
Hgb A1c MFr Bld: 6.6 % — ABNORMAL HIGH (ref <5.7)
Mean Plasma Glucose: 143 mg/dL — ABNORMAL HIGH (ref <117)

## 2015-04-06 MED ORDER — FUROSEMIDE 20 MG PO TABS: 20.0000 mg | ORAL_TABLET | Freq: Every day | ORAL | Status: DC

## 2015-04-06 MED ORDER — GABAPENTIN 300 MG PO CAPS: 300.0000 mg | ORAL_CAPSULE | Freq: Every day | ORAL | Status: DC

## 2015-04-06 MED ORDER — METOPROLOL SUCCINATE ER 25 MG PO TB24: ORAL_TABLET | ORAL | Status: DC

## 2015-04-06 MED ORDER — TRAMADOL HCL 50 MG PO TABS: 50.0000 mg | ORAL_TABLET | Freq: Four times a day (QID) | ORAL | Status: DC | PRN

## 2015-04-06 MED ORDER — TRAZODONE HCL 50 MG PO TABS: 50.0000 mg | ORAL_TABLET | Freq: Every evening | ORAL | Status: DC | PRN

## 2015-04-06 NOTE — Assessment & Plan Note (Signed)
Continue to improve TG, with diet and weight loss, continue statin drugs

## 2015-04-06 NOTE — Assessment & Plan Note (Signed)
Well controlled 

## 2015-04-06 NOTE — Patient Instructions (Signed)
Continue current medications Keep working on weight loss We will call with lab results F/U 4 months for Physical

## 2015-04-06 NOTE — Progress Notes (Signed)
Patient ID: Hunter Pratt, male   DOB: 12-07-1955, 59 y.o.   MRN: [id number]   Subjective:    Patient ID: Hunter Pratt, male    DOB: June 07, 1956, 59 y.o.   MRN: [id number]  Patient presents for 4 month F/U  patient here to follow-up chronic medical problems. His only concern is that he cannot hear as well out of his right ear. He denies any recent fever or sinus congestion cough. He was seen by his infectious disease doctor he had some type of pruritic rash unknown cause of that is now resolved. He is due for repeat A1c he  currently has glucose intolerance but his blood sugars have been increasing. Meds reviewed- request refills    Review Of Systems:  GEN- denies fatigue, fever, weight loss,weakness, recent illness HEENT- denies eye drainage, change in vision, nasal discharge, CVS- denies chest pain, palpitations RESP- denies SOB, cough, wheeze ABD- denies N/V, change in stools, abd pain GU- denies dysuria, hematuria, dribbling, incontinence MSK- + joint pain, muscle aches, injury Neuro- denies headache, dizziness, syncope, seizure activity       Objective:    BP 122/76 mmHg  Pulse 78  Temp(Src) 98.9 F (37.2 C) (Oral)  Resp 14  Ht 5\' 11"  (1.803 m)  Wt 248 lb (112.492 kg)  BMI 34.60 kg/m2 GEN- NAD, alert and oriented x3 HEENT- PERRL, EOMI, non injected sclera, pink conjunctiva, MMM, oropharynx clear, impaction with wax in right ear canal, Left TM canal clear Neck- Supple, no LAD CVS- RRR, no murmur RESP-CTAB EXT- No edema Pulses- Radial, DP- 2+        Assessment & Plan:      Problem List Items Addressed This Visit    Hyperlipidemia - Primary   Relevant Orders   Lipid panel   HTN (hypertension)   Glucose intolerance (impaired glucose tolerance)   Relevant Orders   Hemoglobin A1c    Other Visit Diagnoses    Cerumen impaction, right        s/p irrigation at bedside       Note: This dictation was prepared with Dragon dictation along with smaller phrase  technology. Any transcriptional errors that result from this process are unintentional.

## 2015-04-06 NOTE — Assessment & Plan Note (Signed)
Recheck A1C, discussed weight gain and need for loss

## 2015-04-06 NOTE — Addendum Note (Signed)
Addended by: Vic Blackbird F on: 04/06/2015 10:10 AM   Modules accepted: Orders

## 2015-04-12 ENCOUNTER — Other Ambulatory Visit: Payer: Self-pay | Admitting: *Deleted

## 2015-04-12 MED ORDER — BLOOD GLUCOSE TEST VI STRP: ORAL_STRIP | Status: DC

## 2015-04-12 MED ORDER — BLOOD GLUCOSE MONITOR KIT
PACK | Status: DC
Start: 2015-04-12 — End: 2017-03-12

## 2015-04-12 MED ORDER — METFORMIN HCL 500 MG PO TABS: 500.0000 mg | ORAL_TABLET | Freq: Two times a day (BID) | ORAL | Status: DC

## 2015-04-12 MED ORDER — LANCETS MISC. MISC: Status: DC

## 2015-04-18 ENCOUNTER — Other Ambulatory Visit: Payer: Self-pay | Admitting: *Deleted

## 2015-04-18 MED ORDER — ACCU-CHEK SOFTCLIX LANCET DEV MISC: Status: DC

## 2015-04-18 NOTE — Telephone Encounter (Signed)
Received fax requesting refill on Accu-Chek lancets.   Refill appropriate and filled per protocol.

## 2015-05-14 ENCOUNTER — Other Ambulatory Visit (HOSPITAL_COMMUNITY)
Admission: RE | Admit: 2015-05-14 | Discharge: 2015-05-14 | Disposition: A | Discharge status: Home / Self Care | Payer: Medicare Other | Source: Ambulatory Visit | Attending: Infectious Disease | Admitting: Infectious Disease

## 2015-05-14 ENCOUNTER — Other Ambulatory Visit: Payer: Medicare Other

## 2015-05-14 DIAGNOSIS — Z113 Encounter for screening for infections with a predominantly sexual mode of transmission: Secondary | ICD-10-CM | POA: Insufficient documentation

## 2015-05-14 DIAGNOSIS — B20 Human immunodeficiency virus [HIV] disease: Secondary | ICD-10-CM

## 2015-05-14 LAB — CBC WITH DIFFERENTIAL/PLATELET
Basophils Absolute: 0.1 10*3/uL (ref 0.0–0.1)
Basophils Relative: 1 % (ref 0–1)
Eosinophils Absolute: 0.5 10*3/uL (ref 0.0–0.7)
Eosinophils Relative: 5 % (ref 0–5)
HCT: 51.6 % (ref 39.0–52.0)
Hemoglobin: 18 g/dL — ABNORMAL HIGH (ref 13.0–17.0)
Lymphocytes Relative: 32 % (ref 12–46)
Lymphs Abs: 3.1 10*3/uL (ref 0.7–4.0)
MCH: 32.8 pg (ref 26.0–34.0)
MCHC: 34.9 g/dL (ref 30.0–36.0)
MCV: 94 fL (ref 78.0–100.0)
MPV: 9.7 fL (ref 8.6–12.4)
Monocytes Absolute: 1 10*3/uL (ref 0.1–1.0)
Monocytes Relative: 10 % (ref 3–12)
Neutro Abs: 5.1 10*3/uL (ref 1.7–7.7)
Neutrophils Relative %: 52 % (ref 43–77)
Platelets: 285 10*3/uL (ref 150–400)
RBC: 5.49 MIL/uL (ref 4.22–5.81)
RDW: 14 % (ref 11.5–15.5)
WBC: 9.8 10*3/uL (ref 4.0–10.5)

## 2015-05-14 LAB — COMPLETE METABOLIC PANEL WITH GFR
ALT: 19 U/L (ref 0–53)
AST: 22 U/L (ref 0–37)
Albumin: 4.2 g/dL (ref 3.5–5.2)
Alkaline Phosphatase: 124 U/L — ABNORMAL HIGH (ref 39–117)
BUN: 11 mg/dL (ref 6–23)
CO2: 24 mEq/L (ref 19–32)
Calcium: 9.9 mg/dL (ref 8.4–10.5)
Chloride: 100 mEq/L (ref 96–112)
Creat: 1.09 mg/dL (ref 0.50–1.35)
GFR, Est African American: 87 mL/min
GFR, Est Non African American: 75 mL/min
Glucose, Bld: 92 mg/dL (ref 70–99)
Potassium: 4.5 mEq/L (ref 3.5–5.3)
Sodium: 135 mEq/L (ref 135–145)
Total Bilirubin: 0.5 mg/dL (ref 0.2–1.2)
Total Protein: 7.8 g/dL (ref 6.0–8.3)

## 2015-05-14 LAB — RPR

## 2015-05-15 LAB — HIV-1 RNA QUANT-NO REFLEX-BLD
HIV 1 RNA Quant: <20 copies/mL (ref <20)
HIV-1 RNA Quant, Log: <1.3 {Log} (ref <1.30)

## 2015-05-15 LAB — MICROALBUMIN / CREATININE URINE RATIO
Creatinine, Urine: 229 mg/dL
Microalb Creat Ratio: 16.2 mg/g (ref 0.0–30.0)
Microalb, Ur: 3.7 mg/dL — ABNORMAL HIGH (ref <2.0)

## 2015-05-15 LAB — URINE CYTOLOGY ANCILLARY ONLY
Chlamydia: NEGATIVE
Neisseria Gonorrhea: NEGATIVE

## 2015-05-16 LAB — T-HELPER CELL (CD4) - (RCID CLINIC ONLY)
CD4 % Helper T Cell: 24 % — ABNORMAL LOW (ref 33–55)
CD4 T Cell Abs: 780 /uL (ref 400–2700)

## 2015-05-23 ENCOUNTER — Encounter: Payer: Self-pay | Admitting: Infectious Disease

## 2015-05-23 ENCOUNTER — Ambulatory Visit (INDEPENDENT_AMBULATORY_CARE_PROVIDER_SITE_OTHER): Payer: Medicare Other | Admitting: Infectious Disease

## 2015-05-23 VITALS — BP 119/86 | HR 81 | Temp 98.0°F | Wt 238.0 lb

## 2015-05-23 DIAGNOSIS — I10 Essential (primary) hypertension: Secondary | ICD-10-CM | POA: Diagnosis not present

## 2015-05-23 DIAGNOSIS — B20 Human immunodeficiency virus [HIV] disease: Secondary | ICD-10-CM | POA: Diagnosis not present

## 2015-05-23 DIAGNOSIS — E785 Hyperlipidemia, unspecified: Secondary | ICD-10-CM | POA: Diagnosis not present

## 2015-05-23 DIAGNOSIS — L309 Dermatitis, unspecified: Secondary | ICD-10-CM | POA: Diagnosis not present

## 2015-05-23 NOTE — Progress Notes (Signed)
  Subjective:    Patient ID: Hunter Pratt, male    DOB: 08/17/1956, 59 y.o.   MRN: [id number]  HPI   Hunter Pratt is a 59 y.o. male who is doing superbly well on his antiviral regimen, of intelence Isentress and Truvada with undetectable viral load and health cd4 count.  Lab Results  Component Value Date   HIV1RNAQUANT <20 05/14/2015   Lab Results  Component Value Date   CD4TABS 780 05/15/2015   CD4TABS 770 12/13/2014   CD4TABS 920 08/02/2014      Review of Systems  Constitutional: Negative for fever, chills, diaphoresis, activity change, appetite change, fatigue and unexpected weight change.  HENT: Negative for congestion, facial swelling, postnasal drip, rhinorrhea, sinus pressure, sneezing, sore throat and trouble swallowing.   Eyes: Negative for photophobia and visual disturbance.  Respiratory: Negative for cough, chest tightness, shortness of breath, wheezing and stridor.   Cardiovascular: Negative for chest pain, palpitations and leg swelling.  Gastrointestinal: Negative for nausea, vomiting, abdominal pain, diarrhea, constipation, blood in stool, abdominal distention and anal bleeding.  Genitourinary: Negative for dysuria, hematuria, flank pain and difficulty urinating.  Musculoskeletal: Negative for myalgias, back pain, joint swelling, arthralgias and gait problem.  Skin: Negative for color change, pallor, rash and wound.  Neurological: Negative for dizziness, tremors, weakness and light-headedness.  Hematological: Negative for adenopathy. Does not bruise/bleed easily.  Psychiatric/Behavioral: Negative for behavioral problems, confusion, sleep disturbance, dysphoric mood, decreased concentration and agitation.       Objective:   Physical Exam  Constitutional: He is oriented to person, place, and time. He appears well-developed and well-nourished. No distress.  HENT:  Head: Normocephalic and atraumatic.  Mouth/Throat: Oropharynx is clear and moist. No oropharyngeal  exudate.  Eyes: Conjunctivae and EOM are normal. Pupils are equal, round, and reactive to light. No scleral icterus.  Neck: Normal range of motion. Neck supple. No JVD present.  Cardiovascular: Normal rate, regular rhythm and normal heart sounds.  Exam reveals no gallop and no friction rub.   No murmur heard. Pulmonary/Chest: Effort normal and breath sounds normal. No respiratory distress. He has no wheezes. He has no rales. He exhibits no tenderness.  Abdominal: He exhibits no distension and no mass. There is no tenderness. There is no rebound and no guarding.  Musculoskeletal: He exhibits no edema or tenderness.  Lymphadenopathy:    He has no cervical adenopathy.  Subtle cervical lymphadenopathy  Neurological: He is alert and oriented to person, place, and time. He has normal reflexes. He exhibits normal muscle tone. Coordination normal.  Skin: Skin is warm and dry. He is not diaphoretic. No erythema. No pallor.  Psychiatric: He has a normal mood and affect. His behavior is normal. Judgment and thought content normal.          Assessment & Plan:  HIV: continue current regimen of intelence, isentress and truvada.   HTN:  continue toprol  Eczema: continue current meds

## 2015-05-28 ENCOUNTER — Ambulatory Visit: Payer: Medicare Other | Admitting: Infectious Disease

## 2015-06-13 ENCOUNTER — Ambulatory Visit: Payer: Medicare Other

## 2015-08-07 ENCOUNTER — Ambulatory Visit (INDEPENDENT_AMBULATORY_CARE_PROVIDER_SITE_OTHER): Payer: Medicare Other | Admitting: Family Medicine

## 2015-08-07 ENCOUNTER — Encounter: Payer: Self-pay | Admitting: Family Medicine

## 2015-08-07 VITALS — BP 128/72 | HR 88 | Temp 98.1°F | Resp 16 | Ht 71.0 in | Wt 228.0 lb

## 2015-08-07 DIAGNOSIS — E7800 Pure hypercholesterolemia, unspecified: Secondary | ICD-10-CM

## 2015-08-07 DIAGNOSIS — Z23 Encounter for immunization: Secondary | ICD-10-CM

## 2015-08-07 DIAGNOSIS — E669 Obesity, unspecified: Secondary | ICD-10-CM

## 2015-08-07 DIAGNOSIS — B20 Human immunodeficiency virus [HIV] disease: Secondary | ICD-10-CM

## 2015-08-07 DIAGNOSIS — E785 Hyperlipidemia, unspecified: Secondary | ICD-10-CM

## 2015-08-07 DIAGNOSIS — E119 Type 2 diabetes mellitus without complications: Secondary | ICD-10-CM | POA: Diagnosis not present

## 2015-08-07 DIAGNOSIS — I10 Essential (primary) hypertension: Secondary | ICD-10-CM

## 2015-08-07 DIAGNOSIS — D229 Melanocytic nevi, unspecified: Secondary | ICD-10-CM | POA: Diagnosis not present

## 2015-08-07 LAB — BASIC METABOLIC PANEL
BUN: 12 mg/dL (ref 7–25)
CO2: 24 mmol/L (ref 20–31)
Calcium: 9.8 mg/dL (ref 8.6–10.3)
Chloride: 103 mmol/L (ref 98–110)
Creat: 1 mg/dL (ref 0.70–1.33)
Glucose, Bld: 95 mg/dL (ref 70–99)
Potassium: 4.3 mmol/L (ref 3.5–5.3)
Sodium: 136 mmol/L (ref 135–146)

## 2015-08-07 LAB — LIPID PANEL
Cholesterol: 142 mg/dL (ref 125–200)
HDL: 32 mg/dL — ABNORMAL LOW (ref >=40)
LDL Cholesterol: 80 mg/dL (ref <130)
Total CHOL/HDL Ratio: 4.4 Ratio (ref <=5.0)
Triglycerides: 149 mg/dL (ref <150)
VLDL: 30 mg/dL (ref <30)

## 2015-08-07 LAB — HEMOGLOBIN A1C
Hgb A1c MFr Bld: 6 % — ABNORMAL HIGH (ref <5.7)
Mean Plasma Glucose: 126 mg/dL — ABNORMAL HIGH (ref <117)

## 2015-08-07 MED ORDER — TRAMADOL HCL 50 MG PO TABS: 50.0000 mg | ORAL_TABLET | Freq: Four times a day (QID) | ORAL | Status: DC | PRN

## 2015-08-07 MED ORDER — PRAVASTATIN SODIUM 20 MG PO TABS: 20.0000 mg | ORAL_TABLET | Freq: Every day | ORAL | Status: DC

## 2015-08-07 NOTE — Progress Notes (Signed)
Patient ID: Hunter Pratt, male   DOB: September 16, 1956, 59 y.o.   MRN: [id number]   Subjective:    Patient ID: Hunter Pratt, male    DOB: [birth year], 59 y.o.   MRN: [id number]  Patient presents for 4 month F/U  patient had a follow-up chronic medical problems. He has no particular concerns today. He is taking all his medicines as prescribed with the exception of omeprazole he uses as needed and his trazodone he uses as needed. If still being followed by infectious disease for his HIV he has not had any infections or problems recently. Diabetes mellitus his last A1c was 6.6% he is due for repeat today. He does test his blood sugar fasting on average his blood sugar is about 90-110 he has all of his readings with him today the highest was 171 but this is when he initially started his medications. He has changed his diet significantly and has dropped 20 pounds.  He does have some red moles and wanted me to check mole on his back he states that his mother saw   Review Of Systems:  GEN- denies fatigue, fever, weight loss,weakness, recent illness HEENT- denies eye drainage, change in vision, nasal discharge, CVS- denies chest pain, palpitations RESP- denies SOB, cough, wheeze ABD- denies N/V, change in stools, abd pain GU- denies dysuria, hematuria, dribbling, incontinence MSK- denies joint pain, muscle aches, injury Neuro- denies headache, dizziness, syncope, seizure activity       Objective:    BP 128/72 mmHg  Pulse 88  Temp(Src) 98.1 F (36.7 C) (Oral)  Resp 16  Ht 5\' 11"  (1.803 m)  Wt 228 lb (103.42 kg)  BMI 31.81 kg/m2 GEN- NAD, alert and oriented x3 HEENT- PERRL, EOMI, non injected sclera, pink conjunctiva, MMM, oropharynx clear Neck- Supple, no thyromegaly CVS- RRR, no murmur RESP-CTAB Skin- multiple cherry angiomas, few Seb keratosis on back, few flat hyperpigmented macules  EXT- No edema Pulses- Radial, DP- 2+        Assessment & Plan:      Problem List Items Addressed  This Visit    Obesity - Primary   Hyperlipidemia   Relevant Medications   pravastatin (PRAVACHOL) 20 MG tablet   Other Relevant Orders   Lipid panel   Human immunodeficiency virus (HIV) disease    Flu shot given today. He has not had any opportunistic infections and has been doing very well on his medications he is followed by infectious disease      HTN (hypertension)    Blood pressure is well controlled on a continuing him on the metoprolol.       Relevant Medications   pravastatin (PRAVACHOL) 20 MG tablet   Other Relevant Orders   Basic metabolic panel   Diabetes mellitus, type II    Diabetes has been well controlled at home. Hopefully his A1c is less than 6.5% and I can decrease him to just metformin once a day special with his weight loss and his diet change. He is on a statin drug however he is not on ACE inhibitor I'm hoping that we can get him off the metformin altogether. I will like to decrease his many medications possible specially with his HIV medications      Relevant Medications   pravastatin (PRAVACHOL) 20 MG tablet   Other Relevant Orders   Hemoglobin A1c    Other Visit Diagnoses    Elevated LDL cholesterol level        Relevant Medications    pravastatin (  PRAVACHOL) 20 MG tablet    Need for prophylactic vaccination and inoculation against influenza        Relevant Orders    Flu Vaccine QUAD 36+ mos PF IM (Fluarix & Fluzone Quad PF) (Completed)    Numerous moles        benign lesions noted, will monitor for any changes       Note: This dictation was prepared with Dragon dictation along with smaller phrase technology. Any transcriptional errors that result from this process are unintentional.

## 2015-08-07 NOTE — Patient Instructions (Signed)
Continue current medications Flu shot given F/U 4 months PHYSICAL

## 2015-08-07 NOTE — Assessment & Plan Note (Signed)
Blood pressure is well controlled on a continuing him on the metoprolol.

## 2015-08-07 NOTE — Assessment & Plan Note (Addendum)
Diabetes has been well controlled at home. Hopefully his A1c is less than 6.5% and I can decrease him to just metformin once a day special with his weight loss and his diet change. He is on a statin drug however he is not on ACE inhibitor I'm hoping that we can get him off the metformin altogether. I will like to decrease his many medications possible specially with his HIV medications

## 2015-08-07 NOTE — Assessment & Plan Note (Signed)
Flu shot given today. He has not had any opportunistic infections and has been doing very well on his medications he is followed by infectious disease

## 2015-08-08 ENCOUNTER — Other Ambulatory Visit: Payer: Self-pay | Admitting: Family Medicine

## 2015-08-08 NOTE — Telephone Encounter (Signed)
Okay to refill? 

## 2015-08-08 NOTE — Telephone Encounter (Signed)
Call placed to patient.   States that he gets his diabetic supplies from [facility] in [place], but his medications are mailed to him from the [facility] in [place].   Ok to cancel fill in [place] and call in to [facility]?

## 2015-08-08 NOTE — Telephone Encounter (Signed)
Patient last seen on 08/07/2015. Prescription was called in to [facility] in [place]. Call placed to pharmacy and was advised that prescription has not been picked up at this time.   Call to be placed to patient to verify where he would like prescription filled.

## 2015-08-09 NOTE — Telephone Encounter (Signed)
Call placed to [facility]. Medication cancelled.   Call placed to [facility] in [place]. Refills given verbally.

## 2015-08-30 NOTE — Progress Notes (Signed)
Notified [facility] via fax.  Mammie Meras M, RN  

## 2015-09-03 ENCOUNTER — Other Ambulatory Visit: Payer: Self-pay | Admitting: Infectious Disease

## 2015-09-03 DIAGNOSIS — B20 Human immunodeficiency virus [HIV] disease: Secondary | ICD-10-CM

## 2015-09-06 ENCOUNTER — Encounter: Payer: Self-pay | Admitting: Infectious Disease

## 2015-11-01 ENCOUNTER — Other Ambulatory Visit: Payer: Self-pay | Admitting: Infectious Disease

## 2015-11-01 DIAGNOSIS — B20 Human immunodeficiency virus [HIV] disease: Secondary | ICD-10-CM

## 2015-11-05 ENCOUNTER — Other Ambulatory Visit: Payer: Self-pay | Admitting: Family Medicine

## 2015-11-05 NOTE — Telephone Encounter (Signed)
Medication called to pharmacy. 

## 2015-11-05 NOTE — Telephone Encounter (Signed)
Ok to refill??  Last office visit 08/07/2015.  Last refill 08/09/2015, #2 refills.

## 2015-11-05 NOTE — Telephone Encounter (Signed)
okay

## 2015-11-14 ENCOUNTER — Other Ambulatory Visit (HOSPITAL_COMMUNITY)
Admission: RE | Admit: 2015-11-14 | Discharge: 2015-11-14 | Disposition: A | Discharge status: Home / Self Care | Payer: Medicare Other | Source: Ambulatory Visit | Attending: Infectious Disease | Admitting: Infectious Disease

## 2015-11-14 ENCOUNTER — Other Ambulatory Visit: Payer: Medicare Other

## 2015-11-14 DIAGNOSIS — L309 Dermatitis, unspecified: Secondary | ICD-10-CM | POA: Diagnosis not present

## 2015-11-14 DIAGNOSIS — Z113 Encounter for screening for infections with a predominantly sexual mode of transmission: Secondary | ICD-10-CM | POA: Diagnosis not present

## 2015-11-14 DIAGNOSIS — B20 Human immunodeficiency virus [HIV] disease: Secondary | ICD-10-CM | POA: Diagnosis not present

## 2015-11-14 DIAGNOSIS — E785 Hyperlipidemia, unspecified: Secondary | ICD-10-CM | POA: Diagnosis not present

## 2015-11-15 LAB — COMPLETE METABOLIC PANEL WITHOUT GFR
ALT: 13 U/L (ref 9–46)
AST: 17 U/L (ref 10–35)
Albumin: 4 g/dL (ref 3.6–5.1)
Alkaline Phosphatase: 109 U/L (ref 40–115)
BUN: 12 mg/dL (ref 7–25)
CO2: 22 mmol/L (ref 20–31)
Calcium: 9.7 mg/dL (ref 8.6–10.3)
Chloride: 102 mmol/L (ref 98–110)
Creat: 1.03 mg/dL (ref 0.70–1.33)
GFR, Est African American: >89 mL/min
GFR, Est Non African American: 80 mL/min
Glucose, Bld: 95 mg/dL (ref 65–99)
Potassium: 4.3 mmol/L (ref 3.5–5.3)
Sodium: 136 mmol/L (ref 135–146)
Total Bilirubin: 0.4 mg/dL (ref 0.2–1.2)
Total Protein: 7.3 g/dL (ref 6.1–8.1)

## 2015-11-15 LAB — LIPID PANEL
Cholesterol: 134 mg/dL (ref 125–200)
HDL: 37 mg/dL — ABNORMAL LOW
LDL Cholesterol: 70 mg/dL
Total CHOL/HDL Ratio: 3.6 ratio
Triglycerides: 136 mg/dL
VLDL: 27 mg/dL

## 2015-11-15 LAB — CBC WITH DIFFERENTIAL/PLATELET
Basophils Absolute: 0.1 K/uL (ref 0.0–0.1)
Basophils Relative: 1 % (ref 0–1)
Eosinophils Absolute: 0.6 K/uL (ref 0.0–0.7)
Eosinophils Relative: 6 % — ABNORMAL HIGH (ref 0–5)
HCT: 49.3 % (ref 39.0–52.0)
Hemoglobin: 17.7 g/dL — ABNORMAL HIGH (ref 13.0–17.0)
Lymphocytes Relative: 22 % (ref 12–46)
Lymphs Abs: 2.3 K/uL (ref 0.7–4.0)
MCH: 33.5 pg (ref 26.0–34.0)
MCHC: 35.9 g/dL (ref 30.0–36.0)
MCV: 93.2 fL (ref 78.0–100.0)
MPV: 9.8 fL (ref 8.6–12.4)
Monocytes Absolute: 1.2 K/uL — ABNORMAL HIGH (ref 0.1–1.0)
Monocytes Relative: 11 % (ref 3–12)
Neutro Abs: 6.4 K/uL (ref 1.7–7.7)
Neutrophils Relative %: 60 % (ref 43–77)
Platelets: 283 K/uL (ref 150–400)
RBC: 5.29 MIL/uL (ref 4.22–5.81)
RDW: 14.2 % (ref 11.5–15.5)
WBC: 10.6 K/uL — ABNORMAL HIGH (ref 4.0–10.5)

## 2015-11-15 LAB — MICROALBUMIN / CREATININE URINE RATIO
Creatinine, Urine: 190 mg/dL (ref 20–370)
Microalb Creat Ratio: 15 ug/mg{creat}
Microalb, Ur: 2.9 mg/dL

## 2015-11-15 LAB — HIV-1 RNA QUANT-NO REFLEX-BLD
HIV 1 RNA Quant: 24 {copies}/mL — ABNORMAL HIGH
HIV-1 RNA Quant, Log: 1.38 {Log_copies}/mL — ABNORMAL HIGH

## 2015-11-15 LAB — URINE CYTOLOGY ANCILLARY ONLY
Chlamydia: NEGATIVE
Neisseria Gonorrhea: NEGATIVE

## 2015-11-15 LAB — RPR

## 2015-11-15 LAB — T-HELPER CELL (CD4) - (RCID CLINIC ONLY)
CD4 % Helper T Cell: 26 % — ABNORMAL LOW (ref 33–55)
CD4 T Cell Abs: 650 /uL (ref 400–2700)

## 2015-11-28 ENCOUNTER — Ambulatory Visit: Payer: Medicare Other | Admitting: Infectious Disease

## 2015-11-28 ENCOUNTER — Other Ambulatory Visit: Payer: Self-pay | Admitting: Family Medicine

## 2015-12-05 ENCOUNTER — Encounter: Payer: Self-pay | Admitting: Infectious Disease

## 2015-12-05 ENCOUNTER — Ambulatory Visit (INDEPENDENT_AMBULATORY_CARE_PROVIDER_SITE_OTHER): Payer: Medicare Other | Admitting: Infectious Disease

## 2015-12-05 VITALS — BP 133/88 | HR 97 | Temp 97.9°F | Ht 71.0 in | Wt 225.0 lb

## 2015-12-05 DIAGNOSIS — E1100 Type 2 diabetes mellitus with hyperosmolarity without nonketotic hyperglycemic-hyperosmolar coma (NKHHC): Secondary | ICD-10-CM

## 2015-12-05 DIAGNOSIS — B20 Human immunodeficiency virus [HIV] disease: Secondary | ICD-10-CM

## 2015-12-05 DIAGNOSIS — R591 Generalized enlarged lymph nodes: Secondary | ICD-10-CM | POA: Diagnosis not present

## 2015-12-05 DIAGNOSIS — E785 Hyperlipidemia, unspecified: Secondary | ICD-10-CM

## 2015-12-05 DIAGNOSIS — I10 Essential (primary) hypertension: Secondary | ICD-10-CM | POA: Diagnosis not present

## 2015-12-05 DIAGNOSIS — R599 Enlarged lymph nodes, unspecified: Secondary | ICD-10-CM

## 2015-12-05 MED ORDER — EMTRICITABINE-TENOFOVIR AF 200-25 MG PO TABS: 1.0000 | ORAL_TABLET | Freq: Every day | ORAL | Status: DC

## 2015-12-05 NOTE — Progress Notes (Signed)
Subjective:    Patient ID: Hunter Pratt, male    DOB: [birth year], 60 y.o.   MRN: [id number]  HPI   Hunter Pratt is a 61 y.o. male who is doing superbly well on his antiviral regimen, of intelence, Isentress and Truvada with undetectable viral load and health cd4 count.  Lab Results  Component Value Date   HIV1RNAQUANT 24* 11/14/2015   Lab Results  Component Value Date   CD4TABS 650 11/14/2015   CD4TABS 780 05/15/2015   CD4TABS 770 12/13/2014   He has comorbid DM, HTN.   He has had flare of cervical LA in the right side that is subsiding again.   Past Medical History  Diagnosis Date  . HIV infection ([place])   . Arthritis   . GERD (gastroesophageal reflux disease)   . Kidney stone   . PUD (peptic ulcer disease)   . DDD (degenerative disc disease), cervical   . OA (osteoarthritis) of knee   . Hearing loss     Bilateral  . Hyperlipidemia   . Systolic dysfunction     EF 40-45%    Past Surgical History  Procedure Laterality Date  . Cervical discectomy  1990 and 1991    Family History  Problem Relation Age of Onset  . Hypertension Mother   . Osteoporosis Mother   . Osteoporosis Father   . Depression Father   . Hearing loss Father   . Osteoporosis Sister   . Arthritis Sister   . Hypertension Brother   . Hyperlipidemia Brother   . Depression Brother   . Colon cancer Neg Hx       Social History   Social History  . Marital Status: Single    Spouse Name: N/A  . Number of Children: N/A  . Years of Education: N/A   Social History Main Topics  . Smoking status: Former Smoker    Types: Cigarettes  . Smokeless tobacco: Never Used  . Alcohol Use: No  . Drug Use: No  . Sexual Activity: No     Comment: refused condoms   Other Topics Concern  . None   Social History Narrative    Allergies  Allergen Reactions  . Naproxen Sodium     REACTION: kidney failure  . Nsaids     REACTION: GI upset  . Other Rash    Black dye     Current outpatient  prescriptions:  .  blood glucose meter kit and supplies KIT, Dispense based on patient and insurance preference. Use to monitor FSBS 1x daily. Dx: E11.9, Disp: 1 each, Rfl: 0 .  fluocinonide ointment (LIDEX) 8.46 %, Apply 1 application topically 2 (two) times daily., Disp: 30 g, Rfl: 2 .  Glucose Blood (BLOOD GLUCOSE TEST STRIPS) STRP, Dispense based on patient and insurance preference. Use to monitor FSBS 1x daily. Dx: E11.9, Disp: 50 each, Rfl: 11 .  INTELENCE 200 MG TABS, TAKE 1 TABLET(200 MG) BY MOUTH TWICE DAILY, Disp: 60 tablet, Rfl: 3 .  ISENTRESS 400 MG tablet, TAKE 1 TABLET BY MOUTH TWICE DAILY, Disp: 60 tablet, Rfl: 5 .  Lancet Devices (ACCU-CHEK SOFTCLIX) lancets, Use as instructed, Disp: 100 each, Rfl: 5 .  Lancets Misc. MISC, Dispense based on patient and insurance preference. Use to monitor FSBS 1x daily. Dx: E11.9, Disp: 50 each, Rfl: 11 .  metFORMIN (GLUCOPHAGE) 500 MG tablet, Take 1 tablet (500 mg total) by mouth 2 (two) times daily with a meal., Disp: 180 tablet, Rfl: 3 .  metoprolol succinate (  TOPROL-XL) 25 MG 24 hr tablet, TAKE 1/2 TABLET BY MOUTH EVERY DAY AS NEEDED FOR BLOOD PRESSURE, Disp: 15 tablet, Rfl: 0 .  omeprazole (PRILOSEC) 20 MG capsule, TAKE 2 CAPSULES BY MOUTH EVERY DAY, Disp: 60 capsule, Rfl: 11 .  pravastatin (PRAVACHOL) 20 MG tablet, Take 1 tablet (20 mg total) by mouth daily., Disp: 30 tablet, Rfl: 11 .  traZODone (DESYREL) 50 MG tablet, TAKE 1 TABLET(50 MG) BY MOUTH AT BEDTIME AS NEEDED FOR SLEEP, Disp: 30 tablet, Rfl: 0 .  emtricitabine-tenofovir AF (DESCOVY) 200-25 MG tablet, Take 1 tablet by mouth daily., Disp: 30 tablet, Rfl: 11    Review of Systems  Constitutional: Negative for fever, chills, diaphoresis, activity change, appetite change, fatigue and unexpected weight change.  HENT: Negative for congestion, facial swelling, postnasal drip, rhinorrhea, sinus pressure, sneezing, sore throat and trouble swallowing.   Eyes: Negative for photophobia and  visual disturbance.  Respiratory: Negative for cough, chest tightness, shortness of breath, wheezing and stridor.   Cardiovascular: Negative for chest pain, palpitations and leg swelling.  Gastrointestinal: Negative for nausea, vomiting, abdominal pain, diarrhea, constipation, blood in stool, abdominal distention and anal bleeding.  Genitourinary: Negative for dysuria, hematuria, flank pain and difficulty urinating.  Musculoskeletal: Negative for myalgias, back pain, joint swelling, arthralgias and gait problem.  Skin: Negative for color change, pallor, rash and wound.  Neurological: Negative for dizziness, tremors, weakness and light-headedness.  Hematological: Positive for adenopathy. Does not bruise/bleed easily.  Psychiatric/Behavioral: Negative for behavioral problems, confusion, sleep disturbance, dysphoric mood, decreased concentration and agitation.       Objective:   Physical Exam  Constitutional: He is oriented to person, place, and time. He appears well-developed and well-nourished. No distress.  HENT:  Head: Normocephalic and atraumatic.  Mouth/Throat: Oropharynx is clear and moist. No oropharyngeal exudate.  Eyes: Conjunctivae and EOM are normal. Pupils are equal, round, and reactive to light. No scleral icterus.  Neck: Normal range of motion. Neck supple. No JVD present.  Cardiovascular: Normal rate, regular rhythm and normal heart sounds.  Exam reveals no gallop and no friction rub.   No murmur heard. Pulmonary/Chest: Effort normal and breath sounds normal. No respiratory distress. He has no wheezes. He has no rales. He exhibits no tenderness.  Abdominal: He exhibits no distension and no mass. There is no tenderness. There is no rebound and no guarding.  Musculoskeletal: He exhibits no edema or tenderness.  Lymphadenopathy:    He has no cervical adenopathy.  Right cervical LA enlarged on right side of neck  Neurological: He is alert and oriented to person, place, and time.  He has normal reflexes. He exhibits normal muscle tone. Coordination normal.  Skin: Skin is warm and dry. He is not diaphoretic. No erythema. No pallor.  Psychiatric: He has a normal mood and affect. His behavior is normal. Judgment and thought content normal.          Assessment & Plan:  HIV: continue intelence, isentress and exchange [place] for Truvada, renew ADAP, RTC in July  HTN:  continue toprol, being followed by PCP Dr. 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 No suspicion of malignancy  I spent greater than 25 minutes with the patient including greater than 50% of time in face to face counsel of the patient re his HIV, HTN, DM cervcial LA and in coordination of his care.

## 2015-12-07 ENCOUNTER — Encounter: Payer: Self-pay | Admitting: Family Medicine

## 2015-12-07 ENCOUNTER — Ambulatory Visit (INDEPENDENT_AMBULATORY_CARE_PROVIDER_SITE_OTHER): Payer: Medicare Other | Admitting: Family Medicine

## 2015-12-07 VITALS — BP 128/78 | HR 76 | Temp 98.0°F | Resp 16 | Ht 71.0 in | Wt 225.0 lb

## 2015-12-07 DIAGNOSIS — G609 Hereditary and idiopathic neuropathy, unspecified: Secondary | ICD-10-CM

## 2015-12-07 DIAGNOSIS — E669 Obesity, unspecified: Secondary | ICD-10-CM | POA: Diagnosis not present

## 2015-12-07 DIAGNOSIS — Z0000 Encounter for general adult medical examination without abnormal findings: Secondary | ICD-10-CM | POA: Diagnosis not present

## 2015-12-07 DIAGNOSIS — Z125 Encounter for screening for malignant neoplasm of prostate: Secondary | ICD-10-CM | POA: Diagnosis not present

## 2015-12-07 DIAGNOSIS — E785 Hyperlipidemia, unspecified: Secondary | ICD-10-CM

## 2015-12-07 DIAGNOSIS — E1100 Type 2 diabetes mellitus with hyperosmolarity without nonketotic hyperglycemic-hyperosmolar coma (NKHHC): Secondary | ICD-10-CM | POA: Diagnosis not present

## 2015-12-07 DIAGNOSIS — I10 Essential (primary) hypertension: Secondary | ICD-10-CM

## 2015-12-07 LAB — HEMOGLOBIN A1C
Hgb A1c MFr Bld: 6 % — ABNORMAL HIGH (ref <5.7)
Mean Plasma Glucose: 126 mg/dL — ABNORMAL HIGH (ref <117)

## 2015-12-07 NOTE — Assessment & Plan Note (Signed)
Blood pressure well controlled

## 2015-12-07 NOTE — Patient Instructions (Signed)
Continue current medications  We will call lab results F/U 4 months 

## 2015-12-07 NOTE — Assessment & Plan Note (Signed)
CBG look great, I think he will be able to decrease MTF or even stop pending A1C results  His HIV meds are difficult on his renal function so I was holding off on ACEI

## 2015-12-07 NOTE — Assessment & Plan Note (Signed)
LDL at goal with pravastatin

## 2015-12-07 NOTE — Progress Notes (Signed)
Patient ID: Hunter Pratt, male   DOB: [birth year], 60 y.o.   MRN: [id number] Subjective:   Patient presents for Medicare Annual/Subsequent preventive examination.   A she here for annual wellness exam. He is no particular concerns today. Diabetes mellitus his last A1c was 6% his blood sugars range 80-120 fasting I advised him to decrease his metformin at the last visit to 500 mg once a day but he continue with 500 twice a day. He has not had any hypoglycemic symptoms. He does get some tingling and sharp pains in his feet he was tried on gabapentin in the past he does not want any medications to treat this.  He was briefly seen by infectious disease for his HIV there are change in his medications to help decrease the effects on his renal function though his renal function is normal. Review Past Medical/Family/Social: Per EMR   Risk Factors  Current exercise habits: walks  Dietary issues discussed: YES  Cardiac risk factors: Obesity (BMI >= 30 kg/m2). DM   Depression Screen  (Note: if answer to either of the following is "Yes", a more complete depression screening is indicated)  Over the past two weeks, have you felt down, depressed or hopeless? No Over the past two weeks, have you felt little interest or pleasure in doing things? No Have you lost interest or pleasure in daily life? No Do you often feel hopeless? No Do you cry easily over simple problems? No   Activities of Daily Living  In your present state of health, do you have any difficulty performing the following activities?:  Driving? No  Managing money? No  Feeding yourself? No  Getting from bed to chair? No  Climbing a flight of stairs? No  Preparing food and eating?: No  Bathing or showering? No  Getting dressed: No  Getting to the toilet? No  Using the toilet:No  Moving around from place to place: No  In the past year have you fallen or had a near fall?:No  Are you sexually active? No  Do you have more than one partner?  No   Hearing Difficulties: - Wears Hearing AIDES Do you often ask people to speak up or repeat themselves? yes  Do you experience ringing or noises in your ears? yes Do you have difficulty understanding soft or whispered voices?yes Do you feel that you have a problem with memory? No Do you often misplace items? No  Do you feel safe at home? Yes  Cognitive Testing  Alert? Yes Normal Appearance?Yes  Oriented to person? Yes Place? Yes  Time? Yes  Recall of three objects? Yes  Can perform simple calculations? Yes  Displays appropriate judgment?Yes  Can read the correct time from a watch face?Yes   List the Names of Other Physician/Practitioners you currently use:   Dr. Tommy Medal  Screening Tests / Date Colonoscopy    UTD                 Zostavax - Due Age 88 Pneumonia-UTD Influenza Vaccine -UTD Tetanus/tdap - Due finanacial    ROS: GEN- denies fatigue, fever, weight loss,weakness, recent illness HEENT- denies eye drainage, change in vision, nasal discharge, CVS- denies chest pain, palpitations RESP- denies SOB, cough, wheeze ABD- denies N/V, change in stools, abd pain GU- denies dysuria, hematuria, dribbling, incontinence MSK- denies joint pain, muscle aches, injury Neuro- denies headache, dizziness, syncope, seizure activity  Physical: GEN- NAD, alert and oriented x3 HEENT- PERRL, EOMI, non injected sclera, pink conjunctiva, MMM,  oropharynx clear Neck- Supple, no thryomegaly, small shotty RIght submandibular node  CVS- RRR, no murmur RESP-CTAB ABD-NABS,soft,NT,ND GU- Rectum- normal tone, FOBT neg, soft stool in vault, prostate smooth no nodules EXT- No edema Pulses- Radial, DP- 2+     Assessment:    Annual wellness medicare exam   Plan:    During the course of the visit the patient was educated and counseled about appropriate screening and preventive services including:  Immunizations UTD except TDAP but not covered by insurance unless injury .  ScreenNEG for  depression.   Reviewed ID note- doing well - HIV Load is undetectable  Diet review for nutrition referral? Yes ____ Not Indicated __x__  Patient Instructions (the written plan) was given to the patient.  Medicare Attestation  I have personally reviewed:  The patient's medical and social history  Their use of alcohol, tobacco or illicit drugs  Their current medications and supplements  The patient's functional ability including ADLs,fall risks, home safety risks, cognitive, and hearing and visual impairment  Diet and physical activities  Evidence for depression or mood disorders  The patient's weight, height, BMI, and visual acuity have been recorded in the chart. I have made referrals, counseling, and provided education to the patient based on review of the above and I have provided the patient with a written personalized care plan for preventive services.

## 2015-12-07 NOTE — Assessment & Plan Note (Signed)
He has some mild neuropathy in his feet however he does not want to try any other medications for this.

## 2015-12-07 NOTE — Assessment & Plan Note (Signed)
Continue to work on dietary changes.  

## 2015-12-08 LAB — PSA, MEDICARE: PSA: 0.22 ng/mL (ref <=4.00)

## 2015-12-28 ENCOUNTER — Other Ambulatory Visit: Payer: Self-pay | Admitting: Family Medicine

## 2015-12-28 ENCOUNTER — Other Ambulatory Visit: Payer: Self-pay | Admitting: Infectious Disease

## 2015-12-28 DIAGNOSIS — K219 Gastro-esophageal reflux disease without esophagitis: Secondary | ICD-10-CM

## 2015-12-28 NOTE — Telephone Encounter (Signed)
Refill appropriate and filled per protocol. 

## 2016-01-22 ENCOUNTER — Other Ambulatory Visit: Payer: Self-pay | Admitting: Family Medicine

## 2016-01-22 NOTE — Telephone Encounter (Signed)
Refill appropriate and filled per protocol. 

## 2016-01-22 NOTE — Telephone Encounter (Signed)
Medication refilled per protocol. 

## 2016-02-06 ENCOUNTER — Other Ambulatory Visit: Payer: Self-pay | Admitting: Family Medicine

## 2016-02-06 NOTE — Telephone Encounter (Signed)
Medication refilled per protocol. 

## 2016-02-19 ENCOUNTER — Other Ambulatory Visit: Payer: Self-pay | Admitting: Infectious Disease

## 2016-02-19 ENCOUNTER — Other Ambulatory Visit: Payer: Self-pay | Admitting: Family Medicine

## 2016-02-19 DIAGNOSIS — B20 Human immunodeficiency virus [HIV] disease: Secondary | ICD-10-CM

## 2016-02-19 NOTE — Telephone Encounter (Signed)
Refill appropriate and filled per protocol. 

## 2016-04-07 ENCOUNTER — Ambulatory Visit: Payer: Medicare Other | Admitting: Family Medicine

## 2016-04-22 ENCOUNTER — Ambulatory Visit (INDEPENDENT_AMBULATORY_CARE_PROVIDER_SITE_OTHER): Payer: Medicare Other | Admitting: Family Medicine

## 2016-04-22 ENCOUNTER — Encounter: Payer: Self-pay | Admitting: Family Medicine

## 2016-04-22 VITALS — BP 122/76 | HR 80 | Temp 97.6°F | Resp 18 | Wt 221.0 lb

## 2016-04-22 DIAGNOSIS — I10 Essential (primary) hypertension: Secondary | ICD-10-CM | POA: Diagnosis not present

## 2016-04-22 DIAGNOSIS — E785 Hyperlipidemia, unspecified: Secondary | ICD-10-CM | POA: Diagnosis not present

## 2016-04-22 DIAGNOSIS — E1100 Type 2 diabetes mellitus with hyperosmolarity without nonketotic hyperglycemic-hyperosmolar coma (NKHHC): Secondary | ICD-10-CM

## 2016-04-22 DIAGNOSIS — B20 Human immunodeficiency virus [HIV] disease: Secondary | ICD-10-CM | POA: Diagnosis not present

## 2016-04-22 DIAGNOSIS — E669 Obesity, unspecified: Secondary | ICD-10-CM

## 2016-04-22 NOTE — Patient Instructions (Signed)
We will call with lab results F/U 4 months  

## 2016-04-22 NOTE — Progress Notes (Signed)
Patient ID: Hunter Pratt, male   DOB: June 04, 1956, 60 y.o.   MRN: [id number]    Subjective:    Patient ID: Hunter Pratt, male    DOB: 1956-10-15, 60 y.o.   MRN: [id number]  Patient presents for 4 mth check up Patient to follow chronic medical problems. He has no particular concerns today. He states that he feels well. He has been working out and intentionally trying to lose weight his goal to get down at least another 10 pounds. He is currently on metformin 500 mg once a day his blood sugar this morning was 106 his 30 day average is 104 fasting he typically takes his blood sugar about 3 times a week. He has not had any hypoglycemic symptoms.  He did have a change in one of his HIV medications that is supposed to help more with renal protection.  Medications reviewed    Review Of Systems:  GEN- denies fatigue, fever, weight loss,weakness, recent illness HEENT- denies eye drainage, change in vision, nasal discharge, CVS- denies chest pain, palpitations RESP- denies SOB, cough, wheeze ABD- denies N/V, change in stools, abd pain GU- denies dysuria, hematuria, dribbling, incontinence MSK- denies joint pain, muscle aches, injury Neuro- denies headache, dizziness, syncope, seizure activity       Objective:    BP 122/76 mmHg  Pulse 80  Temp(Src) 97.6 F (36.4 C) (Oral)  Resp 18  Wt 221 lb (100.245 kg) GEN- NAD, alert and oriented x3 HEENT- PERRL, EOMI, non injected sclera, pink conjunctiva, MMM, oropharynx clear CVS- RRR, no murmur RESP-CTAB EXT- No edema Pulses- Radial, DP- 2+        Assessment & Plan:      Problem List Items Addressed This Visit    Hyperlipidemia   Relevant Orders   Lipid panel   Human immunodeficiency virus (HIV) disease ([place])    Continues to follow with infectious disease doing well his viral count has been undetectable      HTN (hypertension)    Blood pressure well controlled and change her medication      Relevant Orders   CBC with  Differential/Platelet   Comprehensive metabolic panel   Diabetes mellitus, type II ([place]) - Primary    Diabetes has been very well-controlled with his weight loss I think we may be able to take him off of the metformin completely I will recheck his labs today. His blood pressure is also well controlled He is on statin drug      Relevant Orders   Hemoglobin A1c      Note: This dictation was prepared with Dragon dictation along with smaller phrase technology. Any transcriptional errors that result from this process are unintentional.

## 2016-04-22 NOTE — Assessment & Plan Note (Signed)
Blood pressure well controlled and change her medication 

## 2016-04-22 NOTE — Assessment & Plan Note (Addendum)
Diabetes has been very well-controlled with his weight loss I think we may be able to take him off of the metformin completely I will recheck his labs today. His blood pressure is also well controlled He is on statin drug

## 2016-04-22 NOTE — Assessment & Plan Note (Signed)
Continues to follow with infectious disease doing well his viral count has been undetectable

## 2016-04-23 LAB — COMPREHENSIVE METABOLIC PANEL
ALT: 10 U/L (ref 9–46)
AST: 13 U/L (ref 10–35)
Albumin: 4.4 g/dL (ref 3.6–5.1)
Alkaline Phosphatase: 98 U/L (ref 40–115)
BUN: 14 mg/dL (ref 7–25)
CO2: 24 mmol/L (ref 20–31)
Calcium: 9.9 mg/dL (ref 8.6–10.3)
Chloride: 101 mmol/L (ref 98–110)
Creat: 1.12 mg/dL (ref 0.70–1.33)
Glucose, Bld: 95 mg/dL (ref 70–99)
Potassium: 4.5 mmol/L (ref 3.5–5.3)
Sodium: 136 mmol/L (ref 135–146)
Total Bilirubin: 0.5 mg/dL (ref 0.2–1.2)
Total Protein: 7.9 g/dL (ref 6.1–8.1)

## 2016-04-23 LAB — CBC WITH DIFFERENTIAL/PLATELET
Basophils Absolute: 93 cells/uL (ref 0–200)
Basophils Relative: 1 %
Eosinophils Absolute: 558 cells/uL — ABNORMAL HIGH (ref 15–500)
Eosinophils Relative: 6 %
HCT: 52.8 % — ABNORMAL HIGH (ref 38.5–50.0)
Hemoglobin: 18.6 g/dL — ABNORMAL HIGH (ref 13.0–17.0)
Lymphocytes Relative: 25 %
Lymphs Abs: 2325 cells/uL (ref 850–3900)
MCH: 34 pg — ABNORMAL HIGH (ref 27.0–33.0)
MCHC: 35.2 g/dL (ref 32.0–36.0)
MCV: 96.5 fL (ref 80.0–100.0)
MPV: 9.8 fL (ref 7.5–12.5)
Monocytes Absolute: 1023 cells/uL — ABNORMAL HIGH (ref 200–950)
Monocytes Relative: 11 %
Neutro Abs: 5301 cells/uL (ref 1500–7800)
Neutrophils Relative %: 57 %
Platelets: 326 10*3/uL (ref 140–400)
RBC: 5.47 MIL/uL (ref 4.20–5.80)
RDW: 14.3 % (ref 11.0–15.0)
WBC: 9.3 10*3/uL (ref 3.8–10.8)

## 2016-04-23 LAB — LIPID PANEL
Cholesterol: 151 mg/dL (ref 125–200)
HDL: 38 mg/dL — ABNORMAL LOW (ref >=40)
LDL Cholesterol: 74 mg/dL (ref <130)
Total CHOL/HDL Ratio: 4 Ratio (ref <=5.0)
Triglycerides: 194 mg/dL — ABNORMAL HIGH (ref <150)
VLDL: 39 mg/dL — ABNORMAL HIGH (ref <30)

## 2016-04-23 LAB — HEMOGLOBIN A1C
Hgb A1c MFr Bld: 5.9 % — ABNORMAL HIGH (ref <5.7)
Mean Plasma Glucose: 123 mg/dL

## 2016-04-24 LAB — HIV-1 RNA QUANT-NO REFLEX-BLD

## 2016-05-14 ENCOUNTER — Other Ambulatory Visit: Payer: Medicare Other

## 2016-05-14 ENCOUNTER — Other Ambulatory Visit (HOSPITAL_COMMUNITY)
Admission: RE | Admit: 2016-05-14 | Discharge: 2016-05-14 | Disposition: A | Discharge status: Home / Self Care | Payer: Medicare Other | Source: Ambulatory Visit | Attending: Infectious Disease | Admitting: Infectious Disease

## 2016-05-14 DIAGNOSIS — E785 Hyperlipidemia, unspecified: Secondary | ICD-10-CM

## 2016-05-14 DIAGNOSIS — B20 Human immunodeficiency virus [HIV] disease: Secondary | ICD-10-CM | POA: Diagnosis not present

## 2016-05-14 DIAGNOSIS — Z113 Encounter for screening for infections with a predominantly sexual mode of transmission: Secondary | ICD-10-CM | POA: Diagnosis not present

## 2016-05-14 LAB — CBC WITH DIFFERENTIAL/PLATELET
Basophils Absolute: 113 cells/uL (ref 0–200)
Basophils Relative: 1 %
Eosinophils Absolute: 226 cells/uL (ref 15–500)
Eosinophils Relative: 2 %
HCT: 48.9 % (ref 38.5–50.0)
Hemoglobin: 17.2 g/dL — ABNORMAL HIGH (ref 13.2–17.1)
Lymphocytes Relative: 20 %
Lymphs Abs: 2260 cells/uL (ref 850–3900)
MCH: 33.7 pg — ABNORMAL HIGH (ref 27.0–33.0)
MCHC: 35.2 g/dL (ref 32.0–36.0)
MCV: 95.7 fL (ref 80.0–100.0)
MPV: 9.7 fL (ref 7.5–12.5)
Monocytes Absolute: 1356 cells/uL — ABNORMAL HIGH (ref 200–950)
Monocytes Relative: 12 %
Neutro Abs: 7345 cells/uL (ref 1500–7800)
Neutrophils Relative %: 65 %
Platelets: 275 10*3/uL (ref 140–400)
RBC: 5.11 MIL/uL (ref 4.20–5.80)
RDW: 14.5 % (ref 11.0–15.0)
WBC: 11.3 10*3/uL — ABNORMAL HIGH (ref 3.8–10.8)

## 2016-05-14 LAB — COMPLETE METABOLIC PANEL WITH GFR
ALT: 10 U/L (ref 9–46)
AST: 14 U/L (ref 10–35)
Albumin: 4.2 g/dL (ref 3.6–5.1)
Alkaline Phosphatase: 89 U/L (ref 40–115)
BUN: 13 mg/dL (ref 7–25)
CO2: 25 mmol/L (ref 20–31)
Calcium: 9.5 mg/dL (ref 8.6–10.3)
Chloride: 101 mmol/L (ref 98–110)
Creat: 1.08 mg/dL (ref 0.70–1.33)
GFR, Est African American: 87 mL/min (ref >=60)
GFR, Est Non African American: 75 mL/min (ref >=60)
Glucose, Bld: 98 mg/dL (ref 65–99)
Potassium: 4.1 mmol/L (ref 3.5–5.3)
Sodium: 137 mmol/L (ref 135–146)
Total Bilirubin: 0.5 mg/dL (ref 0.2–1.2)
Total Protein: 7.7 g/dL (ref 6.1–8.1)

## 2016-05-14 LAB — LIPID PANEL
Cholesterol: 149 mg/dL (ref 125–200)
HDL: 39 mg/dL — ABNORMAL LOW (ref >=40)
LDL Cholesterol: 72 mg/dL (ref <130)
Total CHOL/HDL Ratio: 3.8 Ratio (ref <=5.0)
Triglycerides: 192 mg/dL — ABNORMAL HIGH (ref <150)
VLDL: 38 mg/dL — ABNORMAL HIGH (ref <30)

## 2016-05-14 NOTE — Addendum Note (Signed)
Addended byMeriel Pica F on: 05/14/2016 11:41 AM   Modules accepted: Orders

## 2016-05-15 LAB — URINE CYTOLOGY ANCILLARY ONLY
Chlamydia: NEGATIVE
Neisseria Gonorrhea: NEGATIVE

## 2016-05-15 LAB — T-HELPER CELL (CD4) - (RCID CLINIC ONLY)
CD4 % Helper T Cell: 29 % — ABNORMAL LOW (ref 33–55)
CD4 T Cell Abs: 650 /uL (ref 400–2700)

## 2016-05-15 LAB — HIV-1 RNA QUANT-NO REFLEX-BLD
HIV 1 RNA Quant: 112 copies/mL — ABNORMAL HIGH (ref <20)
HIV-1 RNA Quant, Log: 2.05 Log copies/mL — ABNORMAL HIGH (ref <1.30)

## 2016-05-15 LAB — RPR

## 2016-05-19 ENCOUNTER — Ambulatory Visit (INDEPENDENT_AMBULATORY_CARE_PROVIDER_SITE_OTHER): Payer: Medicare Other | Admitting: Infectious Disease

## 2016-05-19 ENCOUNTER — Encounter: Payer: Self-pay | Admitting: Infectious Disease

## 2016-05-19 ENCOUNTER — Other Ambulatory Visit: Payer: Self-pay | Admitting: Family Medicine

## 2016-05-19 VITALS — BP 147/102 | HR 90 | Temp 98.2°F | Ht 71.0 in | Wt 218.0 lb

## 2016-05-19 DIAGNOSIS — B20 Human immunodeficiency virus [HIV] disease: Secondary | ICD-10-CM | POA: Diagnosis not present

## 2016-05-19 DIAGNOSIS — E1100 Type 2 diabetes mellitus with hyperosmolarity without nonketotic hyperglycemic-hyperosmolar coma (NKHHC): Secondary | ICD-10-CM | POA: Diagnosis not present

## 2016-05-19 DIAGNOSIS — I10 Essential (primary) hypertension: Secondary | ICD-10-CM | POA: Diagnosis not present

## 2016-05-19 MED ORDER — FLUOCINONIDE 0.05 % EX OINT: TOPICAL_OINTMENT | Freq: Two times a day (BID) | CUTANEOUS | Status: DC

## 2016-05-19 NOTE — Progress Notes (Signed)
Chief complaint: followup for HIV Subjective:    Patient ID: Hunter Pratt, male    DOB: 05-25-1956, 60 y.o.   MRN: [id number]  HPI   Hunter Pratt is a 60 y.o. male who is doing superbly well on his antiviral regimen, of intelence, Isentress and Descovy  with typically undetecable viral load and health cd4 count though his VL is up sligthly due to a likely "blip."  Lab Results  Component Value Date   HIV1RNAQUANT 112* 05/14/2016   HIV1RNAQUANT CANCELED 04/22/2016   HIV1RNAQUANT 24* 11/14/2015    Lab Results  Component Value Date   CD4TABS 650 05/14/2016   CD4TABS 650 11/14/2015   CD4TABS 780 05/15/2015     .   Past Medical History  Diagnosis Date  . HIV infection ([place])   . Arthritis   . GERD (gastroesophageal reflux disease)   . Kidney stone   . PUD (peptic ulcer disease)   . DDD (degenerative disc disease), cervical   . OA (osteoarthritis) of knee   . Hearing loss     Bilateral  . Hyperlipidemia   . Systolic dysfunction     EF 40-45%    Past Surgical History  Procedure Laterality Date  . Cervical discectomy  1990 and 1991    Family History  Problem Relation Age of Onset  . Hypertension Mother   . Osteoporosis Mother   . Osteoporosis Father   . Depression Father   . Hearing loss Father   . Osteoporosis Sister   . Arthritis Sister   . Hypertension Brother   . Hyperlipidemia Brother   . Depression Brother   . Colon cancer Neg Hx       Social History   Social History  . Marital Status: Single    Spouse Name: N/A  . Number of Children: N/A  . Years of Education: N/A   Social History Main Topics  . Smoking status: Former Smoker    Types: Cigarettes  . Smokeless tobacco: Never Used  . Alcohol Use: No  . Drug Use: No  . Sexual Activity: No     Comment: refused condoms   Other Topics Concern  . Not on file   Social History Narrative    Allergies  Allergen Reactions  . Naproxen Sodium     REACTION: kidney failure  . Nsaids     REACTION:  GI upset  . Other Rash    Black dye     Current outpatient prescriptions:  .  blood glucose meter kit and supplies KIT, Dispense based on patient and insurance preference. Use to monitor FSBS 1x daily. Dx: E11.9, Disp: 1 each, Rfl: 0 .  emtricitabine-tenofovir AF (DESCOVY) 200-25 MG tablet, Take 1 tablet by mouth daily., Disp: 30 tablet, Rfl: 11 .  fluocinonide ointment (LIDEX) 0.05 %, Apply topically 2 (two) times daily., Disp: 30 g, Rfl: 0 .  Glucose Blood (BLOOD GLUCOSE TEST STRIPS) STRP, Dispense based on patient and insurance preference. Use to monitor FSBS 1x daily. Dx: E11.9, Disp: 50 each, Rfl: 11 .  INTELENCE 200 MG TABS, TAKE 1 TABLET(200 MG) BY MOUTH TWICE DAILY, Disp: 60 tablet, Rfl: 5 .  ISENTRESS 400 MG tablet, TAKE 1 TABLET BY MOUTH TWICE DAILY, Disp: 60 tablet, Rfl: 5 .  Lancet Devices (ACCU-CHEK SOFTCLIX) lancets, Use as instructed, Disp: 100 each, Rfl: 5 .  Lancets Misc. MISC, Dispense based on patient and insurance preference. Use to monitor FSBS 1x daily. Dx: E11.9, Disp: 50 each, Rfl: 11 .  metFORMIN (GLUCOPHAGE) 500 MG tablet, Take 1 tablet (500 mg total) by mouth 2 (two) times daily with a meal. (Patient taking differently: Take 500 mg by mouth daily with breakfast. ), Disp: 180 tablet, Rfl: 3 .  metoprolol succinate (TOPROL-XL) 25 MG 24 hr tablet, TAKE 1/2 TABLET BY MOUTH EVERY DAY AS NEEDED FOR BLOOD PRESSURE, Disp: 45 tablet, Rfl: 6 .  omeprazole (PRILOSEC) 20 MG capsule, TAKE 2 CAPSULES BY MOUTH DAILY, Disp: 60 capsule, Rfl: 4 .  pravastatin (PRAVACHOL) 20 MG tablet, Take 1 tablet (20 mg total) by mouth daily., Disp: 30 tablet, Rfl: 11 .  traZODone (DESYREL) 50 MG tablet, TAKE 1 TABLET(50 MG) BY MOUTH AT BEDTIME AS NEEDED FOR SLEEP, Disp: 30 tablet, Rfl: 6    Review of Systems  Constitutional: Negative for fever, chills, diaphoresis, activity change, appetite change, fatigue and unexpected weight change.  HENT: Negative for congestion, facial swelling, postnasal  drip, rhinorrhea, sinus pressure, sneezing, sore throat and trouble swallowing.   Eyes: Negative for photophobia and visual disturbance.  Respiratory: Negative for cough, chest tightness, shortness of breath, wheezing and stridor.   Cardiovascular: Negative for chest pain, palpitations and leg swelling.  Gastrointestinal: Negative for nausea, vomiting, abdominal pain, diarrhea, constipation, blood in stool, abdominal distention and anal bleeding.  Genitourinary: Negative for dysuria, hematuria, flank pain and difficulty urinating.  Musculoskeletal: Negative for myalgias, back pain, joint swelling, arthralgias and gait problem.  Skin: Negative for color change, pallor, rash and wound.  Neurological: Negative for dizziness, tremors, weakness and light-headedness.  Hematological: Positive for adenopathy. Does not bruise/bleed easily.  Psychiatric/Behavioral: Negative for behavioral problems, confusion, sleep disturbance, dysphoric mood, decreased concentration and agitation.       Objective:   Physical Exam  Constitutional: He is oriented to person, place, and time. He appears well-developed and well-nourished. No distress.  HENT:  Head: Normocephalic and atraumatic.  Mouth/Throat: Oropharynx is clear and moist. No oropharyngeal exudate.  Eyes: Conjunctivae and EOM are normal. Pupils are equal, round, and reactive to light. No scleral icterus.  Neck: Normal range of motion. Neck supple. No JVD present.  Cardiovascular: Normal rate, regular rhythm and normal heart sounds.  Exam reveals no gallop and no friction rub.   No murmur heard. Pulmonary/Chest: Effort normal and breath sounds normal. No respiratory distress. He has no wheezes. He has no rales. He exhibits no tenderness.  Abdominal: He exhibits no distension and no mass. There is no tenderness. There is no rebound and no guarding.  Musculoskeletal: He exhibits no edema or tenderness.  Lymphadenopathy:    He has no cervical adenopathy.   Right cervical LA enlarged on right side of neck  Neurological: He is alert and oriented to person, place, and time. He has normal reflexes. He exhibits normal muscle tone. Coordination normal.  Skin: Skin is warm and dry. He is not diaphoretic. No erythema. No pallor.  Psychiatric: He has a normal mood and affect. His behavior is normal. Judgment and thought content normal.          Assessment & Plan:  HIV: continue intelence, isentress and  Descovy  renew ADAP, RTC in 3-4 months  HTN:  continue toprol, being followed by PCP Dr. Buelah Manis  DM: followed by PCP

## 2016-05-19 NOTE — Telephone Encounter (Signed)
Medication refilled per protocol. 

## 2016-05-21 ENCOUNTER — Ambulatory Visit: Payer: Medicare Other | Admitting: Infectious Disease

## 2016-05-21 DIAGNOSIS — H524 Presbyopia: Secondary | ICD-10-CM | POA: Diagnosis not present

## 2016-05-21 DIAGNOSIS — H5203 Hypermetropia, bilateral: Secondary | ICD-10-CM | POA: Diagnosis not present

## 2016-05-21 DIAGNOSIS — E119 Type 2 diabetes mellitus without complications: Secondary | ICD-10-CM | POA: Diagnosis not present

## 2016-05-26 ENCOUNTER — Other Ambulatory Visit: Payer: Self-pay | Admitting: Infectious Disease

## 2016-05-28 ENCOUNTER — Ambulatory Visit: Payer: Medicare Other | Admitting: Infectious Disease

## 2016-06-02 ENCOUNTER — Ambulatory Visit: Payer: Medicare Other

## 2016-06-05 ENCOUNTER — Encounter: Payer: Self-pay | Admitting: Infectious Disease

## 2016-07-17 ENCOUNTER — Other Ambulatory Visit: Payer: Self-pay | Admitting: Family Medicine

## 2016-07-17 DIAGNOSIS — E7800 Pure hypercholesterolemia, unspecified: Secondary | ICD-10-CM

## 2016-07-17 NOTE — Telephone Encounter (Signed)
Refill appropriate and filled per protocol. 

## 2016-07-31 ENCOUNTER — Other Ambulatory Visit: Payer: Medicare Other

## 2016-07-31 ENCOUNTER — Other Ambulatory Visit (HOSPITAL_COMMUNITY)
Admission: RE | Admit: 2016-07-31 | Discharge: 2016-07-31 | Disposition: A | Discharge status: Home / Self Care | Payer: Medicare Other | Source: Ambulatory Visit | Attending: Infectious Disease | Admitting: Infectious Disease

## 2016-07-31 DIAGNOSIS — B20 Human immunodeficiency virus [HIV] disease: Secondary | ICD-10-CM | POA: Diagnosis not present

## 2016-07-31 DIAGNOSIS — Z113 Encounter for screening for infections with a predominantly sexual mode of transmission: Secondary | ICD-10-CM | POA: Insufficient documentation

## 2016-07-31 LAB — CBC WITH DIFFERENTIAL/PLATELET
Basophils Absolute: 104 cells/uL (ref 0–200)
Basophils Relative: 1 %
Eosinophils Absolute: 520 cells/uL — ABNORMAL HIGH (ref 15–500)
Eosinophils Relative: 5 %
HCT: 51.2 % — ABNORMAL HIGH (ref 38.5–50.0)
Hemoglobin: 17.6 g/dL — ABNORMAL HIGH (ref 13.2–17.1)
Lymphocytes Relative: 25 %
Lymphs Abs: 2600 cells/uL (ref 850–3900)
MCH: 33.3 pg — ABNORMAL HIGH (ref 27.0–33.0)
MCHC: 34.4 g/dL (ref 32.0–36.0)
MCV: 97 fL (ref 80.0–100.0)
MPV: 10 fL (ref 7.5–12.5)
Monocytes Absolute: 1040 cells/uL — ABNORMAL HIGH (ref 200–950)
Monocytes Relative: 10 %
Neutro Abs: 6136 cells/uL (ref 1500–7800)
Neutrophils Relative %: 59 %
Platelets: 311 10*3/uL (ref 140–400)
RBC: 5.28 MIL/uL (ref 4.20–5.80)
RDW: 14.3 % (ref 11.0–15.0)
WBC: 10.4 10*3/uL (ref 3.8–10.8)

## 2016-08-01 LAB — RPR

## 2016-08-01 LAB — URINE CYTOLOGY ANCILLARY ONLY
Chlamydia: NEGATIVE
Neisseria Gonorrhea: NEGATIVE

## 2016-08-01 LAB — HIV-1 RNA QUANT-NO REFLEX-BLD
HIV 1 RNA Quant: <20 copies/mL (ref <20)
HIV-1 RNA Quant, Log: <1.3 Log copies/mL (ref <1.30)

## 2016-08-01 LAB — T-HELPER CELL (CD4) - (RCID CLINIC ONLY)
CD4 % Helper T Cell: 22 % — ABNORMAL LOW (ref 33–55)
CD4 T Cell Abs: 600 /uL (ref 400–2700)

## 2016-08-01 LAB — HEPATITIS C ANTIBODY: HCV Ab: NEGATIVE

## 2016-08-18 ENCOUNTER — Ambulatory Visit (INDEPENDENT_AMBULATORY_CARE_PROVIDER_SITE_OTHER): Payer: Medicare Other | Admitting: Infectious Disease

## 2016-08-18 ENCOUNTER — Other Ambulatory Visit: Payer: Self-pay | Admitting: Family Medicine

## 2016-08-18 ENCOUNTER — Encounter: Payer: Self-pay | Admitting: Infectious Disease

## 2016-08-18 VITALS — BP 110/74 | HR 72 | Temp 97.8°F | Wt 213.0 lb

## 2016-08-18 DIAGNOSIS — B20 Human immunodeficiency virus [HIV] disease: Secondary | ICD-10-CM

## 2016-08-18 DIAGNOSIS — E785 Hyperlipidemia, unspecified: Secondary | ICD-10-CM

## 2016-08-18 DIAGNOSIS — Z23 Encounter for immunization: Secondary | ICD-10-CM

## 2016-08-18 DIAGNOSIS — E7800 Pure hypercholesterolemia, unspecified: Secondary | ICD-10-CM

## 2016-08-18 DIAGNOSIS — I10 Essential (primary) hypertension: Secondary | ICD-10-CM | POA: Diagnosis not present

## 2016-08-18 MED ORDER — RALTEGRAVIR POTASSIUM 600 MG PO TABS: 1200.0000 mg | ORAL_TABLET | Freq: Every day | ORAL | 11 refills | Status: DC

## 2016-08-18 MED ORDER — INTELENCE 200 MG PO TABS: 400.0000 mg | ORAL_TABLET | Freq: Every day | ORAL | 11 refills | Status: DC

## 2016-08-18 NOTE — Progress Notes (Signed)
Subjective:    Patient ID: Hunter Pratt, male    DOB: [birth year], 60 y.o.   MRN: [id number]  HPI  61 y.o. male who is doing superbly well on his antiviral regimen, of intelence, Isentress both BID and Descovy once dailyi with undetectable viral load and health cd4 count.  Lab Results  Component Value Date   HIV1RNAQUANT <20 07/31/2016   HIV1RNAQUANT 112 (H) 05/14/2016   HIV1RNAQUANT CANCELED 04/22/2016    Lab Results  Component Value Date   CD4TABS 600 07/31/2016   CD4TABS 650 05/14/2016   CD4TABS 650 11/14/2015   Past Medical History:  Diagnosis Date  . Arthritis   . DDD (degenerative disc disease), cervical   . GERD (gastroesophageal reflux disease)   . Hearing loss    Bilateral  . HIV infection ([place])   . Hyperlipidemia   . Kidney stone   . OA (osteoarthritis) of knee   . PUD (peptic ulcer disease)   . Systolic dysfunction    EF 40-45%    Past Surgical History:  Procedure Laterality Date  . CERVICAL DISCECTOMY  1990 and 1991    Family History  Problem Relation Age of Onset  . Hypertension Mother   . Osteoporosis Mother   . Osteoporosis Father   . Depression Father   . Hearing loss Father   . Osteoporosis Sister   . Arthritis Sister   . Hypertension Brother   . Hyperlipidemia Brother   . Depression Brother   . Colon cancer Neg Hx       Social History   Social History  . Marital status: Single    Spouse name: N/A  . Number of children: N/A  . Years of education: N/A   Social History Main Topics  . Smoking status: Former Smoker    Types: Cigarettes  . Smokeless tobacco: Never Used  . Alcohol use No  . Drug use: No  . Sexual activity: No     Comment: refused condoms   Other Topics Concern  . Not on file   Social History Narrative  . No narrative on file    Allergies  Allergen Reactions  . Naproxen Sodium     REACTION: kidney failure  . Nsaids     REACTION: GI upset  . Other Rash    Black dye     Current Outpatient  Prescriptions:  .  blood glucose meter kit and supplies KIT, Dispense based on patient and insurance preference. Use to monitor FSBS 1x daily. Dx: E11.9, Disp: 1 each, Rfl: 0 .  emtricitabine-tenofovir AF (DESCOVY) 200-25 MG tablet, Take 1 tablet by mouth daily., Disp: 30 tablet, Rfl: 11 .  fluocinonide ointment (LIDEX) 0.05 %, Apply topically 2 (two) times daily., Disp: 30 g, Rfl: 0 .  Glucose Blood (BLOOD GLUCOSE TEST STRIPS) STRP, Dispense based on patient and insurance preference. Use to monitor FSBS 1x daily. Dx: E11.9, Disp: 50 each, Rfl: 11 .  INTELENCE 200 MG TABS, TAKE 1 TABLET(200 MG) BY MOUTH TWICE DAILY, Disp: 60 tablet, Rfl: 5 .  ISENTRESS 400 MG tablet, TAKE 1 TABLET BY MOUTH TWICE DAILY, Disp: 60 tablet, Rfl: 5 .  Lancet Devices (ACCU-CHEK SOFTCLIX) lancets, Use as instructed, Disp: 100 each, Rfl: 5 .  Lancets Misc. MISC, Dispense based on patient and insurance preference. Use to monitor FSBS 1x daily. Dx: E11.9, Disp: 50 each, Rfl: 11 .  metoprolol succinate (TOPROL-XL) 25 MG 24 hr tablet, TAKE 1/2 TABLET BY MOUTH EVERY DAY AS NEEDED FOR BLOOD  PRESSURE, Disp: 45 tablet, Rfl: 6 .  omeprazole (PRILOSEC) 20 MG capsule, TAKE 2 CAPSULES BY MOUTH DAILY, Disp: 60 capsule, Rfl: 4 .  pravastatin (PRAVACHOL) 20 MG tablet, TAKE 1 TABLET(20 MG) BY MOUTH DAILY, Disp: 30 tablet, Rfl: 0 .  traZODone (DESYREL) 50 MG tablet, TAKE 1 TABLET(50 MG) BY MOUTH AT BEDTIME AS NEEDED FOR SLEEP, Disp: 30 tablet, Rfl: 6  Review of Systems  Constitutional: Negative for chills and fever.  HENT: Negative for congestion and sore throat.   Eyes: Negative for photophobia.  Respiratory: Negative for cough, shortness of breath and wheezing.   Cardiovascular: Negative for chest pain, palpitations and leg swelling.  Gastrointestinal: Negative for abdominal pain, blood in stool, constipation, diarrhea, nausea and vomiting.  Genitourinary: Negative for dysuria, flank pain and hematuria.  Musculoskeletal: Negative for  back pain and myalgias.  Skin: Negative for rash.  Neurological: Negative for dizziness, weakness and headaches.  Hematological: Does not bruise/bleed easily.  Psychiatric/Behavioral: Negative for suicidal ideas.       Objective:   Physical Exam  Constitutional: He is oriented to person, place, and time. He appears well-developed and well-nourished. No distress.  HENT:  Head: Normocephalic and atraumatic.  Mouth/Throat: No oropharyngeal exudate.  Eyes: Conjunctivae and EOM are normal. No scleral icterus.  Neck: Normal range of motion. Neck supple.  Cardiovascular: Normal rate and regular rhythm.   Pulmonary/Chest: Effort normal. No respiratory distress. He has no wheezes.  Abdominal: He exhibits no distension.  Musculoskeletal: He exhibits no edema or tenderness.  Neurological: He is alert and oriented to person, place, and time. He exhibits normal muscle tone. Coordination normal.  Skin: Skin is warm and dry. No rash noted. He is not diaphoretic. No erythema. No pallor.  Psychiatric: He has a normal mood and affect. His behavior is normal. Judgment and thought content normal.          Assessment & Plan:   HIV: well controlled will switch to ISENTRESS HD 668m TWO PILLS ONCE DAILY with TWO INTELENCE PILLS ONCE daily and one Descovy once daily  I introduced [facility] to [facility]  I would like him to come back WITH his new meds in 3 weeks time to check in with Pharmacy  IF any concerns or desire to be super careful could always just check a VL at that visit  Otherwise RTC for labs in December and to see me and renew ADAP in January  HTN:  Vitals:   08/18/16 0836  BP: 110/74  Pulse: 72  Temp: 97.8 F (36.6 C)   BP well controlled  Hyperlipidemia: followed by PCP  I spent greater than 25 minutes with the patient including greater than 50% of time in face to face counsel of the patient re his ARV regimen and in coordination of his are.    HTN:  There were no vitals filed for this visit.

## 2016-08-18 NOTE — Patient Instructions (Signed)
Your new regimen is  TWO ISENTRESS HD 600mg  = 1200mg  ONCE Daily with  TWO INtelence 200mg  = 400mg  ONCE Daily with  ONE Descovy  Make appt with Pharmacy to come back in next 2-3 weeks with your new meds

## 2016-08-26 ENCOUNTER — Ambulatory Visit (INDEPENDENT_AMBULATORY_CARE_PROVIDER_SITE_OTHER): Payer: Medicare Other | Admitting: Family Medicine

## 2016-08-26 ENCOUNTER — Encounter: Payer: Self-pay | Admitting: Family Medicine

## 2016-08-26 VITALS — BP 118/76 | HR 76 | Temp 98.2°F | Resp 14 | Ht 71.0 in | Wt 212.0 lb

## 2016-08-26 DIAGNOSIS — I10 Essential (primary) hypertension: Secondary | ICD-10-CM | POA: Diagnosis not present

## 2016-08-26 DIAGNOSIS — E1100 Type 2 diabetes mellitus with hyperosmolarity without nonketotic hyperglycemic-hyperosmolar coma (NKHHC): Secondary | ICD-10-CM

## 2016-08-26 DIAGNOSIS — E785 Hyperlipidemia, unspecified: Secondary | ICD-10-CM

## 2016-08-26 DIAGNOSIS — E663 Overweight: Secondary | ICD-10-CM | POA: Diagnosis not present

## 2016-08-26 LAB — LIPID PANEL
Cholesterol: 136 mg/dL (ref 125–200)
HDL: 38 mg/dL — ABNORMAL LOW (ref >=40)
LDL Cholesterol: 68 mg/dL (ref <130)
Total CHOL/HDL Ratio: 3.6 Ratio (ref <=5.0)
Triglycerides: 148 mg/dL (ref <150)
VLDL: 30 mg/dL (ref <30)

## 2016-08-26 LAB — COMPREHENSIVE METABOLIC PANEL
ALT: 10 U/L (ref 9–46)
AST: 13 U/L (ref 10–35)
Albumin: 4.2 g/dL (ref 3.6–5.1)
Alkaline Phosphatase: 79 U/L (ref 40–115)
BUN: 14 mg/dL (ref 7–25)
CO2: 26 mmol/L (ref 20–31)
Calcium: 9.8 mg/dL (ref 8.6–10.3)
Chloride: 103 mmol/L (ref 98–110)
Creat: 1.1 mg/dL (ref 0.70–1.33)
Glucose, Bld: 99 mg/dL (ref 70–99)
Potassium: 4.6 mmol/L (ref 3.5–5.3)
Sodium: 138 mmol/L (ref 135–146)
Total Bilirubin: 0.3 mg/dL (ref 0.2–1.2)
Total Protein: 7.5 g/dL (ref 6.1–8.1)

## 2016-08-26 NOTE — Assessment & Plan Note (Signed)
Diet controlled, recheck A1C and lipids  Advised to take pravastatin at night with trazodone As he needs all his HIV meds during the morning, to decrease any possible interactions

## 2016-08-26 NOTE — Progress Notes (Signed)
   Subjective:    Patient ID: Hunter Pratt, male    DOB: 03-27-1956, 60 y.o.   MRN: [id number]  Patient presents for 4 month F/U (is fasting)   Patient here to follow-up chronic medical problems area hypertension diabetes hyperlipidemia. Diabetes mellitus his last A1c was 5.9% and I discontinue his metformin this was 4 months ago. His renal function has been preserved on his last cholesterol check his triglycerides were elevated at 194 no changes made to his statin drug. He continues to follow with his infectious disease specialist for his HIV, reviewed his note from last week He did have a change in his regimen which was noted in the chart.  Flu shot is up-to-date  Review Of Systems:  GEN- denies fatigue, fever, weight loss,weakness, recent illness HEENT- denies eye drainage, change in vision, nasal discharge, CVS- denies chest pain, palpitations RESP- denies SOB, cough, wheeze ABD- denies N/V, change in stools, abd pain GU- denies dysuria, hematuria, dribbling, incontinence MSK- denies joint pain, muscle aches, injury Neuro- denies headache, dizziness, syncope, seizure activity       Objective:    BP 118/76 (BP Location: Left Arm, Patient Position: Sitting, Cuff Size: Normal)   Pulse 76   Temp 98.2 F (36.8 C) (Oral)   Resp 14   Ht 5\' 11"  (1.803 m)   Wt 212 lb (96.2 kg)   BMI 29.57 kg/m  GEN- NAD, alert and oriented x3 HEENT- PERRL, EOMI, non injected sclera, pink conjunctiva, MMM, oropharynx clear, nasal congestion Neck- Supple, no thyromegaly CVS- RRR, no murmur RESP-CTAB ABD-NABS,soft,NT,ND EXT- No edema Pulses- Radial, DP- 2+        Assessment & Plan:      Problem List Items Addressed This Visit    Overweight - Primary    He continues to do well with his diet and exercise      Hyperlipidemia   Relevant Orders   Lipid panel   HTN (hypertension)    Well controlled, continue metoprolol      Relevant Orders   Comprehensive metabolic panel   Diabetes  mellitus, type II ([place])    Diet controlled, recheck A1C and lipids  Advised to take pravastatin at night with trazodone As he needs all his HIV meds during the morning, to decrease any possible interactions      Relevant Orders   Hemoglobin A1c    Other Visit Diagnoses   None.     Note: This dictation was prepared with Dragon dictation along with smaller phrase technology. Any transcriptional errors that result from this process are unintentional.

## 2016-08-26 NOTE — Patient Instructions (Signed)
F/U 4 months for Physical  

## 2016-08-26 NOTE — Assessment & Plan Note (Signed)
Well controlled, continue metoprolol

## 2016-08-26 NOTE — Assessment & Plan Note (Signed)
He continues to do well with his diet and exercise

## 2016-08-27 LAB — HEMOGLOBIN A1C
Hgb A1c MFr Bld: 5.5 % (ref <5.7)
Mean Plasma Glucose: 111 mg/dL

## 2016-08-28 ENCOUNTER — Encounter: Payer: Self-pay | Admitting: *Deleted

## 2016-09-01 ENCOUNTER — Ambulatory Visit: Payer: Medicare Other | Admitting: Pharmacist

## 2016-09-01 DIAGNOSIS — B20 Human immunodeficiency virus [HIV] disease: Secondary | ICD-10-CM

## 2016-09-01 NOTE — Progress Notes (Signed)
HPI: Hunter Pratt is a 60 y.o. male who presents to the [place] [facility] today for follow-up of his HIV.  He recently saw Dr. Tommy Medal and was switched from BID Isentress to the once daily version and from truvada to descovy. He brings his medications in today for me to check.  Allergies: Allergies  Allergen Reactions  . Naproxen Sodium     REACTION: kidney failure  . Nsaids     REACTION: GI upset  . Other Rash    Black dye    Past Medical History: Past Medical History:  Diagnosis Date  . Arthritis   . DDD (degenerative disc disease), cervical   . GERD (gastroesophageal reflux disease)   . Hearing loss    Bilateral  . HIV infection ([place])   . Hyperlipidemia   . Kidney stone   . OA (osteoarthritis) of knee   . PUD (peptic ulcer disease)   . Systolic dysfunction    EF 40-45%    Social History: Social History   Social History  . Marital status: Single    Spouse name: N/A  . Number of children: N/A  . Years of education: N/A   Social History Main Topics  . Smoking status: Former Smoker    Types: Cigarettes  . Smokeless tobacco: Never Used  . Alcohol use No  . Drug use: No  . Sexual activity: No     Comment: refused condoms   Other Topics Concern  . Not on file   Social History Narrative  . No narrative on file    Current Regimen: Isentress HD 1200 mg daily + Descovy once daily + Intelence 400 mg daily  Labs: HIV 1 RNA Quant (copies/mL)  Date Value  07/31/2016 <20  05/14/2016 112 (H)  04/22/2016 CANCELED   CD4 T Cell Abs (/uL)  Date Value  07/31/2016 600  05/14/2016 650  11/14/2015 650   Hep B S Ab (no units)  Date Value  01/25/2007 Yes   Hepatitis B Surface Ag (no units)  Date Value  01/25/2007 No   HCV Ab (no units)  Date Value  07/31/2016 NEGATIVE    CrCl: Estimated Creatinine Clearance: 86.7 mL/min (by C-G formula based on SCr of 1.1 mg/dL).  Lipids:    Component Value Date/Time   CHOL 136 08/26/2016 0829   TRIG 148 08/26/2016  0829   HDL 38 (L) 08/26/2016 0829   CHOLHDL 3.6 08/26/2016 0829   VLDL 30 08/26/2016 0829   LDLCALC 68 08/26/2016 0829    Assessment: Hunter Pratt is here today for follow-up after switching some of his HIV medications.  He is doing well. He is not having any side effects with the change in medications, but does state that his bowel habits have changed.  He said he is not sure if he is constipated - just the timing of his bowel movement have changed.  I educated him on OTC stool softeners he could use and he states he has some at home. He likes taking his medications one time a day - in the morning.  He pulled out his 3 HIV medications out of a bag and showed me how he takes them - he is taking them correctly.  He isn't having any issues today. He is excited his A1c is down to 5 and his other labs, including his cholesterol, look great as well. He would prefer not to get labs today, so we will defer until December.   Plans: - Continue Intelence 400 mg (  2 tablets) once daily - Continue Isentress HD 1200 mg (2 tablets) once daily - Continue Descovy (1 tablet) once daily - Labs on 12/20 at 9am - F/u with Dr. Tommy Medal 12/03/16 at 9:45am  Merrill Villarruel L. Donnajean Lopes, PharmD [facility] 09/01/2016, 9:34 AM

## 2016-09-10 ENCOUNTER — Other Ambulatory Visit: Payer: Self-pay | Admitting: Family Medicine

## 2016-09-12 ENCOUNTER — Encounter: Payer: Self-pay | Admitting: Family Medicine

## 2016-10-14 ENCOUNTER — Other Ambulatory Visit: Payer: Self-pay | Admitting: Family Medicine

## 2016-10-14 ENCOUNTER — Other Ambulatory Visit: Payer: Self-pay | Admitting: *Deleted

## 2016-10-14 ENCOUNTER — Other Ambulatory Visit: Payer: Self-pay | Admitting: Infectious Disease

## 2016-10-14 DIAGNOSIS — K219 Gastro-esophageal reflux disease without esophagitis: Secondary | ICD-10-CM

## 2016-10-14 DIAGNOSIS — B20 Human immunodeficiency virus [HIV] disease: Secondary | ICD-10-CM

## 2016-10-14 MED ORDER — INTELENCE 200 MG PO TABS: 400.0000 mg | ORAL_TABLET | Freq: Every day | ORAL | 11 refills | Status: DC

## 2016-10-14 MED ORDER — INTELENCE 200 MG PO TABS
400.0000 mg | ORAL_TABLET | Freq: Every day | ORAL | 11 refills | Status: DC
Start: 2016-10-14 — End: 2016-10-14

## 2016-11-13 ENCOUNTER — Other Ambulatory Visit: Payer: Self-pay | Admitting: Family Medicine

## 2016-11-13 DIAGNOSIS — E7800 Pure hypercholesterolemia, unspecified: Secondary | ICD-10-CM

## 2016-11-19 ENCOUNTER — Other Ambulatory Visit: Payer: Medicare Other

## 2016-11-19 ENCOUNTER — Other Ambulatory Visit (HOSPITAL_COMMUNITY)
Admission: RE | Admit: 2016-11-19 | Discharge: 2016-11-19 | Disposition: A | Discharge status: Home / Self Care | Payer: Medicare Other | Source: Ambulatory Visit | Attending: Infectious Disease | Admitting: Infectious Disease

## 2016-11-19 DIAGNOSIS — B20 Human immunodeficiency virus [HIV] disease: Secondary | ICD-10-CM | POA: Diagnosis not present

## 2016-11-19 DIAGNOSIS — Z113 Encounter for screening for infections with a predominantly sexual mode of transmission: Secondary | ICD-10-CM | POA: Insufficient documentation

## 2016-11-19 LAB — COMPLETE METABOLIC PANEL WITH GFR
ALT: 12 U/L (ref 9–46)
AST: 16 U/L (ref 10–35)
Albumin: 4.2 g/dL (ref 3.6–5.1)
Alkaline Phosphatase: 80 U/L (ref 40–115)
BUN: 15 mg/dL (ref 7–25)
CO2: 21 mmol/L (ref 20–31)
Calcium: 9.3 mg/dL (ref 8.6–10.3)
Chloride: 104 mmol/L (ref 98–110)
Creat: 1.12 mg/dL (ref 0.70–1.33)
GFR, Est African American: 83 mL/min (ref >=60)
GFR, Est Non African American: 72 mL/min (ref >=60)
Glucose, Bld: 91 mg/dL (ref 65–99)
Potassium: 4.3 mmol/L (ref 3.5–5.3)
Sodium: 137 mmol/L (ref 135–146)
Total Bilirubin: 0.5 mg/dL (ref 0.2–1.2)
Total Protein: 7.2 g/dL (ref 6.1–8.1)

## 2016-11-19 LAB — CBC WITH DIFFERENTIAL/PLATELET
Basophils Absolute: 108 {cells}/uL (ref 0–200)
Basophils Relative: 1 %
Eosinophils Absolute: 432 {cells}/uL (ref 15–500)
Eosinophils Relative: 4 %
HCT: 48.4 % (ref 38.5–50.0)
Hemoglobin: 16.8 g/dL (ref 13.2–17.1)
Lymphocytes Relative: 31 %
Lymphs Abs: 3348 {cells}/uL (ref 850–3900)
MCH: 33.5 pg — ABNORMAL HIGH (ref 27.0–33.0)
MCHC: 34.7 g/dL (ref 32.0–36.0)
MCV: 96.4 fL (ref 80.0–100.0)
MPV: 9.6 fL (ref 7.5–12.5)
Monocytes Absolute: 1080 {cells}/uL — ABNORMAL HIGH (ref 200–950)
Monocytes Relative: 10 %
Neutro Abs: 5832 {cells}/uL (ref 1500–7800)
Neutrophils Relative %: 54 %
Platelets: 277 K/uL (ref 140–400)
RBC: 5.02 MIL/uL (ref 4.20–5.80)
RDW: 14.2 % (ref 11.0–15.0)
WBC: 10.8 K/uL (ref 3.8–10.8)

## 2016-11-20 LAB — RPR

## 2016-11-20 LAB — T-HELPER CELL (CD4) - (RCID CLINIC ONLY)
CD4 % Helper T Cell: 28 % — ABNORMAL LOW (ref 33–55)
CD4 T Cell Abs: 1050 /uL (ref 400–2700)

## 2016-11-20 LAB — URINE CYTOLOGY ANCILLARY ONLY
Chlamydia: NEGATIVE
Neisseria Gonorrhea: NEGATIVE

## 2016-11-21 LAB — HIV-1 RNA QUANT-NO REFLEX-BLD
HIV 1 RNA Quant: <20 copies/mL (ref <20)
HIV-1 RNA Quant, Log: <1.3 Log copies/mL (ref <1.30)

## 2016-12-03 ENCOUNTER — Ambulatory Visit: Payer: Medicare Other | Admitting: Infectious Disease

## 2016-12-03 ENCOUNTER — Ambulatory Visit: Payer: Medicare Other

## 2016-12-06 ENCOUNTER — Other Ambulatory Visit: Payer: Self-pay | Admitting: Family Medicine

## 2016-12-06 DIAGNOSIS — E7800 Pure hypercholesterolemia, unspecified: Secondary | ICD-10-CM

## 2016-12-07 ENCOUNTER — Encounter: Payer: Self-pay | Admitting: Infectious Disease

## 2016-12-07 ENCOUNTER — Encounter: Payer: Self-pay | Admitting: Family Medicine

## 2016-12-07 DIAGNOSIS — K219 Gastro-esophageal reflux disease without esophagitis: Secondary | ICD-10-CM

## 2016-12-07 DIAGNOSIS — E7800 Pure hypercholesterolemia, unspecified: Secondary | ICD-10-CM

## 2016-12-08 ENCOUNTER — Other Ambulatory Visit: Payer: Self-pay | Admitting: *Deleted

## 2016-12-08 DIAGNOSIS — B20 Human immunodeficiency virus [HIV] disease: Secondary | ICD-10-CM

## 2016-12-08 MED ORDER — TRAZODONE HCL 50 MG PO TABS: 50.0000 mg | ORAL_TABLET | Freq: Every evening | ORAL | 3 refills | Status: DC | PRN

## 2016-12-08 MED ORDER — FLUOCINONIDE 0.05 % EX OINT: TOPICAL_OINTMENT | Freq: Two times a day (BID) | CUTANEOUS | 3 refills | Status: DC

## 2016-12-08 MED ORDER — METOPROLOL SUCCINATE ER 25 MG PO TB24: ORAL_TABLET | ORAL | 3 refills | Status: DC

## 2016-12-08 MED ORDER — RALTEGRAVIR POTASSIUM 600 MG PO TABS: 1200.0000 mg | ORAL_TABLET | Freq: Every day | ORAL | 11 refills | Status: DC

## 2016-12-08 MED ORDER — PRAVASTATIN SODIUM 20 MG PO TABS: 20.0000 mg | ORAL_TABLET | Freq: Every day | ORAL | 3 refills | Status: DC

## 2016-12-08 MED ORDER — EMTRICITABINE-TENOFOVIR AF 200-25 MG PO TABS: 1.0000 | ORAL_TABLET | Freq: Every day | ORAL | 11 refills | Status: DC

## 2016-12-08 MED ORDER — INTELENCE 200 MG PO TABS: 400.0000 mg | ORAL_TABLET | Freq: Every day | ORAL | 11 refills | Status: DC

## 2016-12-08 MED ORDER — OMEPRAZOLE 20 MG PO CPDR: 40.0000 mg | DELAYED_RELEASE_CAPSULE | Freq: Every day | ORAL | 3 refills | Status: DC

## 2016-12-08 NOTE — Addendum Note (Signed)
Addended by: Sheral Flow on: 12/08/2016 09:05 AM   Modules accepted: Orders

## 2016-12-10 ENCOUNTER — Other Ambulatory Visit: Payer: Medicare Other

## 2016-12-10 ENCOUNTER — Ambulatory Visit: Payer: Medicare Other

## 2016-12-10 DIAGNOSIS — B20 Human immunodeficiency virus [HIV] disease: Secondary | ICD-10-CM | POA: Diagnosis not present

## 2016-12-11 ENCOUNTER — Other Ambulatory Visit: Payer: Self-pay | Admitting: Infectious Disease

## 2016-12-11 ENCOUNTER — Other Ambulatory Visit: Payer: Self-pay | Admitting: Family Medicine

## 2016-12-11 DIAGNOSIS — E7800 Pure hypercholesterolemia, unspecified: Secondary | ICD-10-CM

## 2016-12-11 LAB — T-HELPER CELL (CD4) - (RCID CLINIC ONLY)
CD4 % Helper T Cell: 23 % — ABNORMAL LOW (ref 33–55)
CD4 T Cell Abs: 670 /uL (ref 400–2700)

## 2016-12-12 LAB — HIV-1 RNA QUANT-NO REFLEX-BLD
HIV 1 RNA Quant: <20 copies/mL (ref <20)
HIV-1 RNA Quant, Log: <1.3 Log copies/mL (ref <1.30)

## 2016-12-16 ENCOUNTER — Telehealth: Payer: Self-pay | Admitting: *Deleted

## 2016-12-16 NOTE — Telephone Encounter (Signed)
Call from [place] stating insurance will not cover omeprazole 20 mg twice daily. They will cover 40 mg once daily; please advise. Myrtis Hopping

## 2016-12-16 NOTE — Telephone Encounter (Signed)
Notified pharmacy this was actually last refilled by PCP. Hunter Pratt

## 2016-12-16 NOTE — Telephone Encounter (Signed)
Ok thanks Jackie 

## 2016-12-16 NOTE — Telephone Encounter (Signed)
Fine to change to 40mg  once daily. I didn't recall rx this

## 2016-12-22 ENCOUNTER — Telehealth: Payer: Self-pay
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
## 2016-12-24 ENCOUNTER — Ambulatory Visit: Payer: Medicare Other | Admitting: Infectious Disease

## 2016-12-29 ENCOUNTER — Ambulatory Visit (INDEPENDENT_AMBULATORY_CARE_PROVIDER_SITE_OTHER): Payer: Medicare Other | Admitting: Family Medicine

## 2016-12-29 ENCOUNTER — Encounter: Payer: Self-pay | Admitting: Family Medicine

## 2016-12-29 VITALS — BP 128/74 | HR 76 | Temp 98.0°F | Resp 16 | Ht 71.0 in | Wt 219.0 lb

## 2016-12-29 DIAGNOSIS — I10 Essential (primary) hypertension: Secondary | ICD-10-CM | POA: Diagnosis not present

## 2016-12-29 DIAGNOSIS — Z0000 Encounter for general adult medical examination without abnormal findings: Secondary | ICD-10-CM

## 2016-12-29 DIAGNOSIS — E782 Mixed hyperlipidemia: Secondary | ICD-10-CM

## 2016-12-29 DIAGNOSIS — K219 Gastro-esophageal reflux disease without esophagitis: Secondary | ICD-10-CM

## 2016-12-29 DIAGNOSIS — E119 Type 2 diabetes mellitus without complications: Secondary | ICD-10-CM | POA: Diagnosis not present

## 2016-12-29 DIAGNOSIS — R1012 Left upper quadrant pain: Secondary | ICD-10-CM | POA: Diagnosis not present

## 2016-12-29 DIAGNOSIS — M67432 Ganglion, left wrist: Secondary | ICD-10-CM

## 2016-12-29 DIAGNOSIS — Z125 Encounter for screening for malignant neoplasm of prostate: Secondary | ICD-10-CM

## 2016-12-29 LAB — LIPID PANEL
Cholesterol: 142 mg/dL (ref <200)
HDL: 43 mg/dL (ref >40)
LDL Cholesterol: 66 mg/dL (ref <100)
Total CHOL/HDL Ratio: 3.3 Ratio (ref <5.0)
Triglycerides: 163 mg/dL — ABNORMAL HIGH (ref <150)
VLDL: 33 mg/dL — ABNORMAL HIGH (ref <30)

## 2016-12-29 LAB — COMPREHENSIVE METABOLIC PANEL
ALT: 12 U/L (ref 9–46)
AST: 14 U/L (ref 10–35)
Albumin: 4.4 g/dL (ref 3.6–5.1)
Alkaline Phosphatase: 87 U/L (ref 40–115)
BUN: 14 mg/dL (ref 7–25)
CO2: 24 mmol/L (ref 20–31)
Calcium: 9.8 mg/dL (ref 8.6–10.3)
Chloride: 102 mmol/L (ref 98–110)
Creat: 1.38 mg/dL — ABNORMAL HIGH (ref 0.70–1.33)
Glucose, Bld: 95 mg/dL (ref 70–99)
Potassium: 4.5 mmol/L (ref 3.5–5.3)
Sodium: 138 mmol/L (ref 135–146)
Total Bilirubin: 0.4 mg/dL (ref 0.2–1.2)
Total Protein: 8.2 g/dL — ABNORMAL HIGH (ref 6.1–8.1)

## 2016-12-29 LAB — URINALYSIS, ROUTINE W REFLEX MICROSCOPIC
Bilirubin Urine: NEGATIVE
Glucose, UA: NEGATIVE
Ketones, ur: NEGATIVE
Leukocytes, UA: NEGATIVE
Nitrite: NEGATIVE
Specific Gravity, Urine: 1.025 (ref 1.001–1.035)
pH: 6 (ref 5.0–8.0)

## 2016-12-29 LAB — CBC WITH DIFFERENTIAL/PLATELET
Basophils Absolute: 192 cells/uL (ref 0–200)
Basophils Relative: 2 %
Eosinophils Absolute: 384 cells/uL (ref 15–500)
Eosinophils Relative: 4 %
HCT: 49.9 % (ref 38.5–50.0)
Hemoglobin: 17.4 g/dL — ABNORMAL HIGH (ref 13.0–17.0)
Lymphocytes Relative: 27 %
Lymphs Abs: 2592 cells/uL (ref 850–3900)
MCH: 34.1 pg — ABNORMAL HIGH (ref 27.0–33.0)
MCHC: 34.9 g/dL (ref 32.0–36.0)
MCV: 97.7 fL (ref 80.0–100.0)
MPV: 9.7 fL (ref 7.5–12.5)
Monocytes Absolute: 1152 cells/uL — ABNORMAL HIGH (ref 200–950)
Monocytes Relative: 12 %
Neutro Abs: 5280 cells/uL (ref 1500–7800)
Neutrophils Relative %: 55 %
Platelets: 299 10*3/uL (ref 140–400)
RBC: 5.11 MIL/uL (ref 4.20–5.80)
RDW: 14.1 % (ref 11.0–15.0)
WBC: 9.6 10*3/uL (ref 3.8–10.8)

## 2016-12-29 LAB — HEMOGLOBIN A1C
Hgb A1c MFr Bld: 5.5 % (ref <5.7)
Mean Plasma Glucose: 111 mg/dL

## 2016-12-29 LAB — TSH: TSH: 2.36 mIU/L (ref 0.40–4.50)

## 2016-12-29 LAB — URINALYSIS, MICROSCOPIC ONLY
Bacteria, UA: NONE SEEN [HPF]
Casts: NONE SEEN [LPF]
Crystals: NONE SEEN [HPF]
Yeast: NONE SEEN [HPF]

## 2016-12-29 LAB — PSA: PSA: 0.4 ng/mL (ref <=4.0)

## 2016-12-29 MED ORDER — OMEPRAZOLE 20 MG PO CPDR: 20.0000 mg | DELAYED_RELEASE_CAPSULE | Freq: Every day | ORAL | 3 refills | Status: DC

## 2016-12-29 NOTE — Assessment & Plan Note (Signed)
Uses daily as needed, change script, has history of PUD

## 2016-12-29 NOTE — Progress Notes (Signed)
Subjective:   Patient presents for Medicare Annual/Subsequent preventive examination.   Medications reviewed. He is still following with infectious disease state that his appointment has been pushed back a couple times. He is due to have his labs done.  He is concerned about intermittent episodes of a left sided pain. It occurs in his flank but also wraps around. He does have history of kidney stone but has not noted any blood in the stool and has not felt like he is passing anything. He has had these intermittent severe episodes over the past couple months. He thought it was related to the increase in his HIV medication. He denies any nausea vomiting no change with eating. There's been no significant change in his bowels.   Review Past Medical/Family/Social: per EMR    Risk Factors  Current exercise habits: Walks Dietary issues discussed: Yes  Cardiac risk factors: Obesity (BMI >= 30 kg/m2). DM, HTN  Depression Screen  (Note: if answer to either of the following is "Yes", a more complete depression screening is indicated)  Over the past two weeks, have you felt down, depressed or hopeless? No Over the past two weeks, have you felt little interest or pleasure in doing things? No Have you lost interest or pleasure in daily life? No Do you often feel hopeless? No Do you cry easily over simple problems? No   Activities of Daily Living  In your present state of health, do you have any difficulty performing the following activities?:  Driving? No  Managing money? No  Feeding yourself? No  Getting from bed to chair? No  Climbing a flight of stairs? No  Preparing food and eating?: No  Bathing or showering? No  Getting dressed: No  Getting to the toilet? No  Using the toilet:No  Moving around from place to place: No  In the past year have you fallen or had a near fall?:No  Are you sexually active? No  Do you have more than one partner? No   Hearing Difficulties: YES- HEARING AIDS   Do you often ask people to speak up or repeat themselves? No  Do you experience ringing or noises in your ears? No Do you have difficulty understanding soft or whispered voices? Yes  Do you feel that you have a problem with memory? No Do you often misplace items? No  Do you feel safe at home? Yes  Cognitive Testing  Alert? Yes Normal Appearance?Yes  Oriented to person? Yes Place? Yes  Time? Yes  Recall of three objects? Yes  Can perform simple calculations? Yes  Displays appropriate judgment?Yes  Can read the correct time from a watch face?Yes   List the Names of Other Physician/Practitioners you currently use: Infectious Disease     Screening Tests / Date Colonoscopy     UTD                Zostavax - live virus pt has HIV Defer to ID Pneumonia- UTD Influenza Vaccine UTD   ROS:  GEN- denies fatigue, fever, weight loss,weakness, recent illness HEENT- denies eye drainage, change in vision, nasal discharge, CVS- denies chest pain, palpitations RESP- denies SOB, cough, wheeze ABD- denies N/V, change in stools, +abd pain GU- denies dysuria, hematuria, dribbling, incontinence MSK- denies joint pain, muscle aches, injury Neuro- denies headache, dizziness, syncope, seizure activity   Physical: GEN- NAD, alert and oriented x3 HEENT- PERRL, EOMI, non injected sclera, pink conjunctiva, MMM, oropharynx clear Neck- Supple, no thryomegaly CVS- RRR, no murmur RESP-CTAB ABD-NABS,soft  mild TTP LLQ, no rebound, no guarding  EXT- No edema Pulses- Radial, DP- 2+    Assessment:    Annual wellness medicare exam   Plan:    During the course of the visit the patient was educated and counseled about appropriate screening and preventive services including:  ABD pain- concern for intermittent episodes studies haven't. We'll obtain a CT of abdomen and pelvis possible that he has kidney stone. We'll check a urinalysis today as well. Other possibility constipation or other bowel disorder  not characteristic of appendicitis or any other acute infection as this has been going on for so long. I doubt this is related to his HIV medication. Screen Neg  for depression. PHQ- 9 score of 2 (   Fasting labs today     Diet review for nutrition referral? Yes ____ Not Indicated __x__  Patient Instructions (the written plan) was given to the patient.  Medicare Attestation  I have personally reviewed:  The patient's medical and social history  Their use of alcohol, tobacco or illicit drugs  Their current medications and supplements  The patient's functional ability including ADLs,fall risks, home safety risks, cognitive, and hearing and visual impairment  Diet and physical activities  Evidence for depression or mood disorders  The patient's weight, height, BMI, and visual acuity have been recorded in the chart. I have made referrals, counseling, and provided education to the patient based on review of the above and I have provided the patient with a written personalized care plan for preventive services.

## 2016-12-29 NOTE — Patient Instructions (Addendum)
CT to be done of abdomen  We will call with lab results Referral for cyst  F/u 6 months

## 2016-12-29 NOTE — Assessment & Plan Note (Signed)
Diet controlled.  

## 2016-12-29 NOTE — Assessment & Plan Note (Signed)
Controlled no changes 

## 2016-12-30 LAB — URINE CULTURE: Organism ID, Bacteria: NO GROWTH

## 2016-12-31 ENCOUNTER — Encounter: Payer: Self-pay | Admitting: Infectious Disease

## 2017-01-01 ENCOUNTER — Other Ambulatory Visit: Payer: Self-pay | Admitting: *Deleted

## 2017-01-01 MED ORDER — OMEPRAZOLE 40 MG PO CPDR: 40.0000 mg | DELAYED_RELEASE_CAPSULE | Freq: Every day | ORAL | 3 refills | Status: DC

## 2017-01-06 ENCOUNTER — Ambulatory Visit (HOSPITAL_COMMUNITY)
Admission: RE | Admit: 2017-01-06 | Discharge: 2017-01-06 | Disposition: A | Discharge status: Home / Self Care | Payer: Medicare Other | Source: Ambulatory Visit | Attending: Family Medicine | Admitting: Family Medicine

## 2017-01-06 DIAGNOSIS — N289 Disorder of kidney and ureter, unspecified: Secondary | ICD-10-CM | POA: Insufficient documentation

## 2017-01-06 DIAGNOSIS — D3502 Benign neoplasm of left adrenal gland: Secondary | ICD-10-CM | POA: Insufficient documentation

## 2017-01-06 DIAGNOSIS — K5900 Constipation, unspecified: Secondary | ICD-10-CM | POA: Insufficient documentation

## 2017-01-06 DIAGNOSIS — M47816 Spondylosis without myelopathy or radiculopathy, lumbar region: Secondary | ICD-10-CM | POA: Diagnosis not present

## 2017-01-06 DIAGNOSIS — I700 Atherosclerosis of aorta: Secondary | ICD-10-CM | POA: Insufficient documentation

## 2017-01-06 DIAGNOSIS — M5136 Other intervertebral disc degeneration, lumbar region: Secondary | ICD-10-CM | POA: Insufficient documentation

## 2017-01-06 DIAGNOSIS — K5730 Diverticulosis of large intestine without perforation or abscess without bleeding: Secondary | ICD-10-CM | POA: Insufficient documentation

## 2017-01-06 DIAGNOSIS — R1012 Left upper quadrant pain: Secondary | ICD-10-CM | POA: Diagnosis present

## 2017-01-20 ENCOUNTER — Encounter: Payer: Self-pay | Admitting: Infectious Disease

## 2017-01-28 ENCOUNTER — Ambulatory Visit (INDEPENDENT_AMBULATORY_CARE_PROVIDER_SITE_OTHER): Payer: Medicare Other | Admitting: Infectious Disease

## 2017-01-28 ENCOUNTER — Encounter: Payer: Self-pay | Admitting: Infectious Disease

## 2017-01-28 VITALS — BP 143/93 | HR 91 | Temp 97.9°F | Ht 71.0 in | Wt 222.0 lb

## 2017-01-28 DIAGNOSIS — M25531 Pain in right wrist: Secondary | ICD-10-CM | POA: Diagnosis not present

## 2017-01-28 DIAGNOSIS — M5030 Other cervical disc degeneration, unspecified cervical region: Secondary | ICD-10-CM

## 2017-01-28 DIAGNOSIS — E782 Mixed hyperlipidemia: Secondary | ICD-10-CM | POA: Diagnosis not present

## 2017-01-28 DIAGNOSIS — M6740 Ganglion, unspecified site: Secondary | ICD-10-CM | POA: Diagnosis not present

## 2017-01-28 DIAGNOSIS — M4722 Other spondylosis with radiculopathy, cervical region: Secondary | ICD-10-CM | POA: Diagnosis not present

## 2017-01-28 DIAGNOSIS — K5900 Constipation, unspecified: Secondary | ICD-10-CM | POA: Diagnosis not present

## 2017-01-28 DIAGNOSIS — B20 Human immunodeficiency virus [HIV] disease: Secondary | ICD-10-CM

## 2017-01-28 HISTORY — DX: Ganglion, unspecified site: M67.40

## 2017-01-28 HISTORY — DX: Constipation, unspecified: K59.00

## 2017-01-28 NOTE — Progress Notes (Signed)
Chief complaint: constipation, side pain, left ganglion cyst  Subjective:    Patient ID: Hunter Pratt, male    DOB: 01-Nov-1956, 60 y.o.   MRN: [id number]  HPI  61y.o. male who is doing superbly well on his antiviral regimen, of intelence, Isentress both QDaily and Descovy once dailyi with undetectable viral load and health cd4 count.  He has been noticing worsening problems with constipation and side pain. He wonders if related to new meds but I am highly skeptical of this. He had recent trip to ED with CT abdomen that showed diverticulosis, abundant stool, incdental renal cyst, adrenaloma, lumbar DDD and stenosis.  He has tried some meds for constipation but disliked the miralax when he found out "it has the same ingredients as antifreeze."  Lab Results  Component Value Date   HIV1RNAQUANT <20 12/10/2016   HIV1RNAQUANT <20 11/19/2016   HIV1RNAQUANT <20 07/31/2016    Lab Results  Component Value Date   CD4TABS 670 12/10/2016   CD4TABS 1,050 11/19/2016   CD4TABS 600 07/31/2016   Past Medical History:  Diagnosis Date  . Arthritis   . DDD (degenerative disc disease), cervical   . GERD (gastroesophageal reflux disease)   . Hearing loss    Bilateral  . HIV infection ([place])   . Hyperlipidemia   . Kidney stone   . OA (osteoarthritis) of knee   . PUD (peptic ulcer disease)   . Systolic dysfunction    EF 40-45%    Past Surgical History:  Procedure Laterality Date  . CERVICAL DISCECTOMY  1990 and 1991    Family History  Problem Relation Age of Onset  . Hypertension Mother   . Osteoporosis Mother   . Osteoporosis Father   . Depression Father   . Hearing loss Father   . Osteoporosis Sister   . Arthritis Sister   . Hypertension Brother   . Hyperlipidemia Brother   . Depression Brother   . Colon cancer Neg Hx       Social History   Social History  . Marital status: Divorced    Spouse name: N/A  . Number of children: N/A  . Years of education: N/A   Social  History Main Topics  . Smoking status: Former Smoker    Types: Cigarettes  . Smokeless tobacco: Never Used  . Alcohol use No  . Drug use: No  . Sexual activity: No     Comment: refused condoms   Other Topics Concern  . None   Social History Narrative  . None    Allergies  Allergen Reactions  . Naproxen Sodium     REACTION: kidney failure  . Nsaids     REACTION: GI upset  . Other Rash    Black dye     Current Outpatient Prescriptions:  .  blood glucose meter kit and supplies KIT, Dispense based on patient and insurance preference. Use to monitor FSBS 1x daily. Dx: E11.9, Disp: 1 each, Rfl: 0 .  DESCOVY 200-25 MG tablet, TAKE 1 TABLET BY MOUTH DAILY, Disp: 30 tablet, Rfl: 3 .  fluocinonide ointment (LIDEX) 0.05 %, Apply topically 2 (two) times daily., Disp: 90 g, Rfl: 3 .  Glucose Blood (BLOOD GLUCOSE TEST STRIPS) STRP, Dispense based on patient and insurance preference. Use to monitor FSBS 1x daily. Dx: E11.9, Disp: 50 each, Rfl: 11 .  INTELENCE 200 MG TABS, Take 2 tablets (400 mg total) by mouth daily., Disp: 60 tablet, Rfl: 11 .  Lancet Devices (ACCU-CHEK SOFTCLIX)  lancets, Use as instructed, Disp: 100 each, Rfl: 5 .  Lancets Misc. MISC, Dispense based on patient and insurance preference. Use to monitor FSBS 1x daily. Dx: E11.9, Disp: 50 each, Rfl: 11 .  metoprolol succinate (TOPROL-XL) 25 MG 24 hr tablet, TAKE 1/2 TABLET BY MOUTH EVERY DAY AS NEEDED FOR BLOOD PRESSURE, Disp: 45 tablet, Rfl: 3 .  omeprazole (PRILOSEC) 40 MG capsule, Take 1 capsule (40 mg total) by mouth daily., Disp: 90 capsule, Rfl: 3 .  pravastatin (PRAVACHOL) 20 MG tablet, Take 1 tablet (20 mg total) by mouth daily., Disp: 90 tablet, Rfl: 3 .  Raltegravir Potassium (ISENTRESS HD) 600 MG TABS, Take 1,200 mg by mouth daily., Disp: 60 tablet, Rfl: 11 .  traZODone (DESYREL) 50 MG tablet, Take 1 tablet (50 mg total) by mouth at bedtime as needed for sleep., Disp: 90 tablet, Rfl: 3  Review of Systems    Constitutional: Negative for chills and fever.  HENT: Negative for congestion and sore throat.   Eyes: Negative for photophobia.  Respiratory: Negative for cough, shortness of breath and wheezing.   Cardiovascular: Negative for chest pain, palpitations and leg swelling.  Gastrointestinal: Positive for constipation. Negative for abdominal pain, blood in stool, diarrhea, nausea and vomiting.  Genitourinary: Negative for dysuria, flank pain and hematuria.  Musculoskeletal: Positive for arthralgias. Negative for back pain and myalgias.  Skin: Negative for rash.  Neurological: Negative for dizziness, weakness and headaches.  Hematological: Does not bruise/bleed easily.  Psychiatric/Behavioral: Negative for suicidal ideas.       Objective:   Physical Exam  Constitutional: He is oriented to person, place, and time. He appears well-developed and well-nourished. No distress.  HENT:  Head: Normocephalic and atraumatic.  Mouth/Throat: No oropharyngeal exudate.  Eyes: Conjunctivae and EOM are normal. No scleral icterus.  Neck: Normal range of motion. Neck supple.  Cardiovascular: Normal rate and regular rhythm.   Pulmonary/Chest: Effort normal. No respiratory distress. He has no wheezes.  Abdominal: He exhibits no distension.  Musculoskeletal: He exhibits no edema or tenderness.  Neurological: He is alert and oriented to person, place, and time. He exhibits normal muscle tone. Coordination normal.  Skin: Skin is warm and dry. No rash noted. He is not diaphoretic. No erythema. No pallor.  Psychiatric: He has a normal mood and affect. His behavior is normal. Judgment and thought content normal.     Ganglion cyst left wrist:      Assessment & Plan:   HIV: well controlled on  ISENTRESS HD 625m TWO PILLS ONCE DAILY with TWO INTELENCE PILLS ONCE daily and one Descovy once daily     HTN: BP not controlled optimally Vitals:   01/28/17 0959  BP: (!) 143/93  Pulse: 91  Temp: 97.9 F  (36.6 C)     Hyperlipidemia: followed by PCP  Ganglion cyst: being referred to orthopedics  Constipation: recommended increase fiber esp with diverticular disease, further per primary  I spent greater than 25 minutes with the patient including greater than 50% of time in face to face counsel of the patient re his ARV regimen his constipation, his HTN, hyperlipidemia  and in coordination of his are.

## 2017-02-05 ENCOUNTER — Encounter: Payer: Self-pay | Admitting: Family Medicine

## 2017-02-05 DIAGNOSIS — M67431 Ganglion, right wrist: Secondary | ICD-10-CM

## 2017-02-16 ENCOUNTER — Encounter (INDEPENDENT_AMBULATORY_CARE_PROVIDER_SITE_OTHER): Payer: Self-pay | Admitting: Orthopaedic Surgery

## 2017-02-16 ENCOUNTER — Ambulatory Visit (INDEPENDENT_AMBULATORY_CARE_PROVIDER_SITE_OTHER): Payer: Medicare Other | Admitting: Orthopaedic Surgery

## 2017-02-16 DIAGNOSIS — M25532 Pain in left wrist: Secondary | ICD-10-CM

## 2017-02-16 DIAGNOSIS — M67432 Ganglion, left wrist: Secondary | ICD-10-CM

## 2017-02-16 NOTE — Progress Notes (Signed)
Office Visit Note   Patient: Hunter Pratt           Date of Birth: 28-Sep-1956           MRN: [id number] Visit Date: 02/16/2017              Requested by: Alycia Rossetti, MD [address] [place], [place] [address] PCP: Vic Blackbird, MD   Assessment & Plan: Visit Diagnoses:  1. Ganglion cyst of volar aspect of left wrist     Plan: MRI of the left wrist with an without contrast to fully evaluate the soft tissue mass. Follow-up after the MRI.  Follow-Up Instructions: Return in about 10 days (around 02/26/2017).   Orders:  Orders Placed This Encounter  Procedures  . MR Wrist Left w/o contrast  . MR Wrist Left w/ contrast   No orders of the defined types were placed in this encounter.     Procedures: No procedures performed   Clinical Data: No additional findings.   Subjective: Chief Complaint  Patient presents with  . Left Wrist - Tingling, Cyst    Shows a 61 year old gentleman with a volar left ganglion cysts since the last few months. He denies any constitutional symptoms or history of cancer. He is HIV positive. He denies any pain. This is bothersome to him with wearing closed. He denies any skin changes or redness    Review of Systems  Constitutional: Negative.   All other systems reviewed and are negative.    Objective: Vital Signs: There were no vitals taken for this visit.  Physical Exam  Constitutional: He is oriented to person, place, and time. He appears well-developed and well-nourished.  HENT:  Head: Normocephalic and atraumatic.  Eyes: Pupils are equal, round, and reactive to light.  Neck: Neck supple.  Pulmonary/Chest: Effort normal.  Abdominal: Soft.  Musculoskeletal: Normal range of motion.  Neurological: He is alert and oriented to person, place, and time.  Skin: Skin is warm.  Psychiatric: He has a normal mood and affect. His behavior is normal. Judgment and thought content normal.  Nursing note and vitals  reviewed.   Ortho Exam Exam of the left wrist shows a large 2 cm to 3 cm circular mass on the volar aspect of the wrist that is firm and nonmobile. There are no skin changes. The hand is warm and well-perfused Specialty Comments:  No specialty comments available.  Imaging: No results found.   PMFS History: Patient Active Problem List   Diagnosis Date Noted  . Ganglion cyst of volar aspect of left wrist 02/16/2017  . Constipation 01/28/2017  . Ganglion cyst 01/28/2017  . Insomnia 03/03/2014  . Cervical spondylosis with radiculopathy 09/14/2013  . Pain in joint, shoulder region 08/11/2013  . Wrist pain 08/11/2013  . DDD (degenerative disc disease), cervical 08/11/2013  . Grief reaction 08/03/2013  . HTN (hypertension) 07/27/2013  . Back pain 05/03/2013  . Enlarged lymph node 03/04/2013  . Eczematous dermatitis 11/14/2012  . Overweight 11/14/2012  . Systolic dysfunction 36/64/4034  . Diabetes type 2, controlled ([facility]) 07/30/2012  . Lower extremity edema 06/09/2012  . OSA (obstructive sleep apnea) 06/09/2012  . METHICILLIN RESISTANT STAPHYLOCOCCUS AUREUS INFECTION 10/04/2009  . DEGENERATIVE JOINT DISEASE, KNEES, BILATERAL 01/30/2009  . Hyperlipidemia 04/21/2008  . GERD 04/21/2008  . MOOD DISORDER IN CONDITIONS CLASSIFIED ELSEWHERE 02/28/2008  . Hereditary and idiopathic peripheral neuropathy 02/28/2008  . PUD 01/03/2008  . Human immunodeficiency virus (HIV) disease ([place]) 12/28/2006   Past Medical  History:  Diagnosis Date  . Arthritis   . Constipation 01/28/2017  . DDD (degenerative disc disease), cervical   . Ganglion cyst 01/28/2017  . GERD (gastroesophageal reflux disease)   . Hearing loss    Bilateral  . HIV infection ([place])   . Hyperlipidemia   . Kidney stone   . OA (osteoarthritis) of knee   . PUD (peptic ulcer disease)   . Systolic dysfunction    EF 40-45%    Family History  Problem Relation Age of Onset  . Hypertension Mother   . Osteoporosis Mother   .  Osteoporosis Father   . Depression Father   . Hearing loss Father   . Osteoporosis Sister   . Arthritis Sister   . Hypertension Brother   . Hyperlipidemia Brother   . Depression Brother   . Colon cancer Neg Hx     Past Surgical History:  Procedure Laterality Date  . CERVICAL DISCECTOMY  1990 and 1991   Social History   Occupational History  . Not on file.   Social History Main Topics  . Smoking status: Former Smoker    Types: Cigarettes  . Smokeless tobacco: Never Used  . Alcohol use No  . Drug use: No  . Sexual activity: No     Comment: refused condoms

## 2017-02-17 NOTE — Addendum Note (Signed)
Addended by: Daylene Posey T on: 02/17/2017 09:54 AM   Modules accepted: Orders

## 2017-02-17 NOTE — Addendum Note (Signed)
Addended by: Daylene Posey T on: 02/17/2017 04:15 PM   Modules accepted: Orders

## 2017-02-25 ENCOUNTER — Other Ambulatory Visit: Payer: Medicare Other

## 2017-02-25 ENCOUNTER — Ambulatory Visit
Admission: RE | Admit: 2017-02-25 | Discharge: 2017-02-25 | Disposition: A | Discharge status: Home / Self Care | Payer: Medicare Other | Source: Ambulatory Visit | Attending: Orthopaedic Surgery | Admitting: Orthopaedic Surgery

## 2017-02-25 DIAGNOSIS — M25532 Pain in left wrist: Secondary | ICD-10-CM

## 2017-02-25 DIAGNOSIS — M67432 Ganglion, left wrist: Secondary | ICD-10-CM

## 2017-03-05 ENCOUNTER — Encounter: Payer: Self-pay | Admitting: Family Medicine

## 2017-03-09 ENCOUNTER — Encounter (INDEPENDENT_AMBULATORY_CARE_PROVIDER_SITE_OTHER): Payer: Self-pay | Admitting: Orthopaedic Surgery

## 2017-03-09 ENCOUNTER — Ambulatory Visit (INDEPENDENT_AMBULATORY_CARE_PROVIDER_SITE_OTHER): Payer: Medicare Other | Admitting: Orthopaedic Surgery

## 2017-03-09 DIAGNOSIS — M67432 Ganglion, left wrist: Secondary | ICD-10-CM

## 2017-03-09 NOTE — Progress Notes (Signed)
Office Visit Note   Patient: Hunter Pratt           Date of Birth: [birth year]           MRN: [id number] Visit Date: 03/09/2017              Requested by: Alycia Rossetti, MD [address] [place], [place] [address] PCP: Vic Blackbird, MD   Assessment & Plan: Visit Diagnoses:  1. Ganglion cyst of volar aspect of left wrist     Plan: MRI of the left wrist shows a large ganglion cyst on the volar aspect and radial aspect abutting the radial artery and FCR tendon. He does have incidental finding of a scapholunate ligament tear.  I discussed the MRI findings with the patient and discussed conservative versus surgical treatment. He understands the risks benefits alternatives of surgery including infection, damage to neurovascular structures, recurrence, etc. and wishes to proceed. We'll get him scheduled at his convenience in the near future.  Follow-Up Instructions: Return for 2 week postop visit.   Orders:  No orders of the defined types were placed in this encounter.  No orders of the defined types were placed in this encounter.     Procedures: No procedures performed   Clinical Data: No additional findings.   Subjective: Chief Complaint  Patient presents with  . Left Wrist - Pain, Follow-up    Hunter Pratt comes back today to review his MRI of his left wrist.    Review of Systems  Constitutional: Negative.   All other systems reviewed and are negative.    Objective: Vital Signs: There were no vitals taken for this visit.  Physical Exam  Constitutional: He is oriented to person, place, and time. He appears well-developed and well-nourished.  Pulmonary/Chest: Effort normal.  Abdominal: Soft.  Neurological: He is alert and oriented to person, place, and time.  Skin: Skin is warm.  Psychiatric: He has a normal mood and affect. His behavior is normal. Judgment and thought content normal.  Nursing note and vitals reviewed.   Ortho Exam Left wrist exam is  stable. Specialty Comments:  No specialty comments available.  Imaging: No results found.   PMFS History: Patient Active Problem List   Diagnosis Date Noted  . Ganglion cyst of volar aspect of left wrist 02/16/2017  . Constipation 01/28/2017  . Ganglion cyst 01/28/2017  . Insomnia 03/03/2014  . Cervical spondylosis with radiculopathy 09/14/2013  . Pain in joint, shoulder region 08/11/2013  . Wrist pain 08/11/2013  . DDD (degenerative disc disease), cervical 08/11/2013  . Grief reaction 08/03/2013  . HTN (hypertension) 07/27/2013  . Back pain 05/03/2013  . Enlarged lymph node 03/04/2013  . Eczematous dermatitis 11/14/2012  . Overweight 11/14/2012  . Systolic dysfunction 12/03/7251  . Diabetes type 2, controlled ([facility]) 07/30/2012  . Lower extremity edema 06/09/2012  . OSA (obstructive sleep apnea) 06/09/2012  . METHICILLIN RESISTANT STAPHYLOCOCCUS AUREUS INFECTION 10/04/2009  . DEGENERATIVE JOINT DISEASE, KNEES, BILATERAL 01/30/2009  . Hyperlipidemia 04/21/2008  . GERD 04/21/2008  . MOOD DISORDER IN CONDITIONS CLASSIFIED ELSEWHERE 02/28/2008  . Hereditary and idiopathic peripheral neuropathy 02/28/2008  . PUD 01/03/2008  . Human immunodeficiency virus (HIV) disease ([facility]) 12/28/2006   Past Medical History:  Diagnosis Date  . Arthritis   . Constipation 01/28/2017  . DDD (degenerative disc disease), cervical   . Ganglion cyst 01/28/2017  . GERD (gastroesophageal reflux disease)   . Hearing loss    Bilateral  . HIV infection ([facility])   .  Hyperlipidemia   . Kidney stone   . OA (osteoarthritis) of knee   . PUD (peptic ulcer disease)   . Systolic dysfunction    EF 40-45%    Family History  Problem Relation Age of Onset  . Hypertension Mother   . Osteoporosis Mother   . Osteoporosis Father   . Depression Father   . Hearing loss Father   . Osteoporosis Sister   . Arthritis Sister   . Hypertension Brother   . Hyperlipidemia Brother   . Depression Brother   . Colon  cancer Neg Hx     Past Surgical History:  Procedure Laterality Date  . CERVICAL DISCECTOMY  1990 and 1991   Social History   Occupational History  . Not on file.   Social History Main Topics  . Smoking status: Former Smoker    Types: Cigarettes  . Smokeless tobacco: Never Used  . Alcohol use No  . Drug use: No  . Sexual activity: No     Comment: refused condoms

## 2017-03-12 ENCOUNTER — Encounter (HOSPITAL_BASED_OUTPATIENT_CLINIC_OR_DEPARTMENT_OTHER): Payer: Self-pay | Admitting: *Deleted

## 2017-03-13 ENCOUNTER — Other Ambulatory Visit (INDEPENDENT_AMBULATORY_CARE_PROVIDER_SITE_OTHER): Payer: Self-pay | Admitting: Orthopaedic Surgery

## 2017-03-13 DIAGNOSIS — M67432 Ganglion, left wrist: Secondary | ICD-10-CM

## 2017-03-16 ENCOUNTER — Encounter (HOSPITAL_COMMUNITY)
Admission: RE | Admit: 2017-03-16 | Discharge: 2017-03-16 | Disposition: A | Discharge status: Home / Self Care | Payer: Medicare Other | Source: Ambulatory Visit | Attending: Orthopaedic Surgery | Admitting: Orthopaedic Surgery

## 2017-03-16 ENCOUNTER — Other Ambulatory Visit: Payer: Self-pay

## 2017-03-16 DIAGNOSIS — G4730 Sleep apnea, unspecified: Secondary | ICD-10-CM | POA: Diagnosis not present

## 2017-03-16 DIAGNOSIS — M659 Synovitis and tenosynovitis, unspecified: Secondary | ICD-10-CM | POA: Diagnosis not present

## 2017-03-16 DIAGNOSIS — E785 Hyperlipidemia, unspecified: Secondary | ICD-10-CM | POA: Diagnosis not present

## 2017-03-16 DIAGNOSIS — K219 Gastro-esophageal reflux disease without esophagitis: Secondary | ICD-10-CM | POA: Diagnosis not present

## 2017-03-16 DIAGNOSIS — I10 Essential (primary) hypertension: Secondary | ICD-10-CM | POA: Diagnosis not present

## 2017-03-16 DIAGNOSIS — Z87891 Personal history of nicotine dependence: Secondary | ICD-10-CM | POA: Diagnosis not present

## 2017-03-16 DIAGNOSIS — M67432 Ganglion, left wrist: Secondary | ICD-10-CM | POA: Diagnosis not present

## 2017-03-16 LAB — CBC
HCT: 50.4 % (ref 39.0–52.0)
Hemoglobin: 17.6 g/dL — ABNORMAL HIGH (ref 13.0–17.0)
MCH: 34 pg (ref 26.0–34.0)
MCHC: 34.9 g/dL (ref 30.0–36.0)
MCV: 97.5 fL (ref 78.0–100.0)
Platelets: 311 10*3/uL (ref 150–400)
RBC: 5.17 MIL/uL (ref 4.22–5.81)
RDW: 13.4 % (ref 11.5–15.5)
WBC: 11.3 10*3/uL — ABNORMAL HIGH (ref 4.0–10.5)

## 2017-03-16 LAB — COMPREHENSIVE METABOLIC PANEL
ALT: 15 U/L — ABNORMAL LOW (ref 17–63)
AST: 19 U/L (ref 15–41)
Albumin: 4.1 g/dL (ref 3.5–5.0)
Alkaline Phosphatase: 85 U/L (ref 38–126)
Anion gap: 9 (ref 5–15)
BUN: 12 mg/dL (ref 6–20)
CO2: 25 mmol/L (ref 22–32)
Calcium: 9.5 mg/dL (ref 8.9–10.3)
Chloride: 101 mmol/L (ref 101–111)
Creatinine, Ser: 1.05 mg/dL (ref 0.61–1.24)
GFR calc Af Amer: >60 mL/min (ref >60)
GFR calc non Af Amer: >60 mL/min (ref >60)
Glucose, Bld: 99 mg/dL (ref 65–99)
Potassium: 4.4 mmol/L (ref 3.5–5.1)
Sodium: 135 mmol/L (ref 135–145)
Total Bilirubin: 0.6 mg/dL (ref 0.3–1.2)
Total Protein: 7.8 g/dL (ref 6.5–8.1)

## 2017-03-16 NOTE — Progress Notes (Signed)
Labs and EKG and last ECHO reviewed by Dr. Jenita Seashore, will proceed with surgery as scheduled.

## 2017-03-17 NOTE — Anesthesia Preprocedure Evaluation (Addendum)
Anesthesia Evaluation  Patient identified by MRN, date of birth, ID band Patient awake    Reviewed: Allergy & Precautions, NPO status , Patient's Chart, lab work & pertinent test results, reviewed documented beta blocker date and time   Airway Mallampati: II  TM Distance: >3 FB Neck ROM: Full    Dental  (+) Edentulous Upper, Edentulous Lower   Pulmonary sleep apnea , former smoker,    Pulmonary exam normal breath sounds clear to auscultation       Cardiovascular hypertension, Pt. on medications and Pt. on home beta blockers Normal cardiovascular exam Rhythm:Regular Rate:Normal     Neuro/Psych PSYCHIATRIC DISORDERS  Neuromuscular disease negative neurological ROS     GI/Hepatic Neg liver ROS, PUD, GERD  ,  Endo/Other  diabetes, Well Controlled, Type 2  Renal/GU Renal disease     Musculoskeletal  (+) Arthritis ,   Abdominal   Peds  Hematology negative hematology ROS (+)   Anesthesia Other Findings   Reproductive/Obstetrics negative OB ROS                            Anesthesia Physical Anesthesia Plan  ASA: III  Anesthesia Plan: General   Post-op Pain Management:    Induction: Intravenous  Airway Management Planned: LMA  Additional Equipment:   Intra-op Plan:   Post-operative Plan: Extubation in OR  Informed Consent: I have reviewed the patients History and Physical, chart, labs and discussed the procedure including the risks, benefits and alternatives for the proposed anesthesia with the patient or authorized representative who has indicated his/her understanding and acceptance.   Dental advisory given  Plan Discussed with: CRNA  Anesthesia Plan Comments:        Anesthesia Quick Evaluation

## 2017-03-18 ENCOUNTER — Ambulatory Visit (HOSPITAL_BASED_OUTPATIENT_CLINIC_OR_DEPARTMENT_OTHER): Payer: Medicare Other | Admitting: Anesthesiology

## 2017-03-18 ENCOUNTER — Encounter (HOSPITAL_BASED_OUTPATIENT_CLINIC_OR_DEPARTMENT_OTHER): Payer: Self-pay

## 2017-03-18 ENCOUNTER — Ambulatory Visit (HOSPITAL_BASED_OUTPATIENT_CLINIC_OR_DEPARTMENT_OTHER)
Admission: RE | Admit: 2017-03-18 | Discharge: 2017-03-18 | Disposition: A | Discharge status: Home / Self Care | Payer: Medicare Other | Source: Ambulatory Visit | Attending: Orthopaedic Surgery | Admitting: Orthopaedic Surgery

## 2017-03-18 ENCOUNTER — Encounter (HOSPITAL_BASED_OUTPATIENT_CLINIC_OR_DEPARTMENT_OTHER)
Admission: RE | Disposition: A | Discharge status: Home / Self Care | Payer: Self-pay | Source: Ambulatory Visit | Attending: Orthopaedic Surgery

## 2017-03-18 DIAGNOSIS — K219 Gastro-esophageal reflux disease without esophagitis: Secondary | ICD-10-CM | POA: Diagnosis not present

## 2017-03-18 DIAGNOSIS — I10 Essential (primary) hypertension: Secondary | ICD-10-CM | POA: Insufficient documentation

## 2017-03-18 DIAGNOSIS — M659 Synovitis and tenosynovitis, unspecified: Secondary | ICD-10-CM | POA: Insufficient documentation

## 2017-03-18 DIAGNOSIS — Z87891 Personal history of nicotine dependence: Secondary | ICD-10-CM | POA: Diagnosis not present

## 2017-03-18 DIAGNOSIS — M65932 Unspecified synovitis and tenosynovitis, left forearm: Secondary | ICD-10-CM

## 2017-03-18 DIAGNOSIS — G4730 Sleep apnea, unspecified: Secondary | ICD-10-CM | POA: Insufficient documentation

## 2017-03-18 DIAGNOSIS — M67432 Ganglion, left wrist: Secondary | ICD-10-CM | POA: Diagnosis not present

## 2017-03-18 DIAGNOSIS — E785 Hyperlipidemia, unspecified: Secondary | ICD-10-CM | POA: Insufficient documentation

## 2017-03-18 DIAGNOSIS — K5900 Constipation, unspecified: Secondary | ICD-10-CM | POA: Diagnosis not present

## 2017-03-18 DIAGNOSIS — M65842 Other synovitis and tenosynovitis, left hand: Secondary | ICD-10-CM | POA: Diagnosis not present

## 2017-03-18 HISTORY — PX: GANGLION CYST EXCISION: SHX1691

## 2017-03-18 SURGERY — EXCISION, GANGLION CYST, WRIST
Anesthesia: General | Site: Wrist | Laterality: Left

## 2017-03-18 MED ORDER — MEPERIDINE HCL 25 MG/ML IJ SOLN: 6.2500 mg | INTRAMUSCULAR | Status: DC | PRN

## 2017-03-18 MED ORDER — FENTANYL CITRATE (PF) 100 MCG/2ML IJ SOLN
50.0000 ug | INTRAMUSCULAR | Status: DC | PRN
  Administered 2017-03-18 (×2): 50 ug via INTRAVENOUS

## 2017-03-18 MED ORDER — OXYCODONE HCL 5 MG/5ML PO SOLN: 5.0000 mg | Freq: Once | ORAL | Status: DC | PRN

## 2017-03-18 MED ORDER — MIDAZOLAM HCL 2 MG/2ML IJ SOLN
INTRAMUSCULAR | Status: AC
  Filled 2017-03-18: qty 2

## 2017-03-18 MED ORDER — PROMETHAZINE HCL 25 MG PO TABS: 25.0000 mg | ORAL_TABLET | Freq: Four times a day (QID) | ORAL | 1 refills | Status: DC | PRN

## 2017-03-18 MED ORDER — LIDOCAINE 2% (20 MG/ML) 5 ML SYRINGE
INTRAMUSCULAR | Status: AC
  Filled 2017-03-18: qty 10

## 2017-03-18 MED ORDER — PROPOFOL 10 MG/ML IV BOLUS
INTRAVENOUS | Status: DC | PRN
  Administered 2017-03-18: 150 mg via INTRAVENOUS

## 2017-03-18 MED ORDER — PROMETHAZINE HCL 25 MG/ML IJ SOLN: 6.2500 mg | INTRAMUSCULAR | Status: DC | PRN

## 2017-03-18 MED ORDER — LACTATED RINGERS IV SOLN
INTRAVENOUS | Status: DC
  Administered 2017-03-18 (×2): via INTRAVENOUS

## 2017-03-18 MED ORDER — CEFAZOLIN SODIUM-DEXTROSE 2-4 GM/100ML-% IV SOLN
INTRAVENOUS | Status: AC
  Filled 2017-03-18: qty 100

## 2017-03-18 MED ORDER — HYDROCODONE-ACETAMINOPHEN 5-325 MG PO TABS: 1.0000 | ORAL_TABLET | Freq: Four times a day (QID) | ORAL | 0 refills | Status: DC | PRN

## 2017-03-18 MED ORDER — FENTANYL CITRATE (PF) 100 MCG/2ML IJ SOLN
INTRAMUSCULAR | Status: AC
  Filled 2017-03-18: qty 2

## 2017-03-18 MED ORDER — BUPIVACAINE HCL (PF) 0.25 % IJ SOLN
INTRAMUSCULAR | Status: AC
  Filled 2017-03-18: qty 30

## 2017-03-18 MED ORDER — OXYCODONE HCL 5 MG PO TABS: 5.0000 mg | ORAL_TABLET | Freq: Once | ORAL | Status: DC | PRN

## 2017-03-18 MED ORDER — DEXAMETHASONE SODIUM PHOSPHATE 10 MG/ML IJ SOLN
INTRAMUSCULAR | Status: DC | PRN
  Administered 2017-03-18: 10 mg via INTRAVENOUS

## 2017-03-18 MED ORDER — LIDOCAINE HCL (CARDIAC) 20 MG/ML IV SOLN
INTRAVENOUS | Status: DC | PRN
  Administered 2017-03-18: 60 mg via INTRAVENOUS

## 2017-03-18 MED ORDER — MIDAZOLAM HCL 2 MG/2ML IJ SOLN
1.0000 mg | INTRAMUSCULAR | Status: DC | PRN
  Administered 2017-03-18: 1 mg via INTRAVENOUS

## 2017-03-18 MED ORDER — SCOPOLAMINE 1 MG/3DAYS TD PT72: 1.0000 | MEDICATED_PATCH | Freq: Once | TRANSDERMAL | Status: DC | PRN

## 2017-03-18 MED ORDER — 0.9 % SODIUM CHLORIDE (POUR BTL) OPTIME
TOPICAL | Status: DC | PRN
  Administered 2017-03-18: 1000 mL

## 2017-03-18 MED ORDER — BUPIVACAINE HCL (PF) 0.25 % IJ SOLN
INTRAMUSCULAR | Status: DC | PRN
  Administered 2017-03-18: 10 mL

## 2017-03-18 MED ORDER — CEFAZOLIN SODIUM-DEXTROSE 2-4 GM/100ML-% IV SOLN
2.0000 g | INTRAVENOUS | Status: AC
  Administered 2017-03-18: 2 g via INTRAVENOUS

## 2017-03-18 MED ORDER — ONDANSETRON HCL 4 MG/2ML IJ SOLN
INTRAMUSCULAR | Status: DC | PRN
  Administered 2017-03-18: 4 mg via INTRAVENOUS

## 2017-03-18 MED ORDER — HYDROMORPHONE HCL 1 MG/ML IJ SOLN: 0.2500 mg | INTRAMUSCULAR | Status: DC | PRN

## 2017-03-18 SURGICAL SUPPLY — 55 items
ADH SKN CLS APL DERMABOND .7 (GAUZE/BANDAGES/DRESSINGS)
BLADE HEX COATED 2.75 (ELECTRODE) IMPLANT
BLADE SURG 15 STRL LF DISP TIS (BLADE) ×2 IMPLANT
BLADE SURG 15 STRL SS (BLADE) ×4
BNDG CMPR 9X4 STRL LF SNTH (GAUZE/BANDAGES/DRESSINGS) ×1
BNDG COHESIVE 4X5 TAN STRL (GAUZE/BANDAGES/DRESSINGS) ×1 IMPLANT
BNDG ESMARK 4X9 LF (GAUZE/BANDAGES/DRESSINGS) ×2 IMPLANT
BNDG GAUZE ELAST 4 BULKY (GAUZE/BANDAGES/DRESSINGS) ×1 IMPLANT
BRUSH SCRUB EZ PLAIN DRY (MISCELLANEOUS) ×2 IMPLANT
CANISTER SUCT 1200ML W/VALVE (MISCELLANEOUS) ×1 IMPLANT
CORDS BIPOLAR (ELECTRODE) ×1 IMPLANT
COVER BACK TABLE 60X90IN (DRAPES) ×2 IMPLANT
CUFF TOURNIQUET SINGLE 18IN (TOURNIQUET CUFF) ×2 IMPLANT
DERMABOND ADVANCED (GAUZE/BANDAGES/DRESSINGS)
DERMABOND ADVANCED .7 DNX12 (GAUZE/BANDAGES/DRESSINGS) IMPLANT
DRAPE EXTREMITY T 121X128X90 (DRAPE) ×2 IMPLANT
DRAPE IMP U-DRAPE 54X76 (DRAPES) ×2 IMPLANT
DRAPE SURG 17X23 STRL (DRAPES) ×4 IMPLANT
DRSG EMULSION OIL 3X3 NADH (GAUZE/BANDAGES/DRESSINGS) ×2 IMPLANT
ELECT REM PT RETURN 9FT ADLT (ELECTROSURGICAL) ×2
ELECTRODE REM PT RTRN 9FT ADLT (ELECTROSURGICAL) ×1 IMPLANT
GAUZE SPONGE 4X4 12PLY STRL (GAUZE/BANDAGES/DRESSINGS) ×2 IMPLANT
GAUZE SPONGE 4X4 16PLY XRAY LF (GAUZE/BANDAGES/DRESSINGS) ×1 IMPLANT
GAUZE XEROFORM 1X8 LF (GAUZE/BANDAGES/DRESSINGS) ×2 IMPLANT
GLOVE SKINSENSE NS SZ7.5 (GLOVE) ×1
GLOVE SKINSENSE STRL SZ7.5 (GLOVE) ×1 IMPLANT
GLOVE SURG SYN 7.5  E (GLOVE) ×1
GLOVE SURG SYN 7.5 E (GLOVE) ×1 IMPLANT
GLOVE SURG SYN 7.5 PF PI (GLOVE) ×1 IMPLANT
GOWN STRL REIN XL XLG (GOWN DISPOSABLE) ×2 IMPLANT
GOWN STRL REUS W/ TWL LRG LVL3 (GOWN DISPOSABLE) ×1 IMPLANT
GOWN STRL REUS W/TWL LRG LVL3 (GOWN DISPOSABLE) ×2
NS IRRIG 1000ML POUR BTL (IV SOLUTION) IMPLANT
PACK BASIN DAY SURGERY FS (CUSTOM PROCEDURE TRAY) ×2 IMPLANT
PAD CAST 4YDX4 CTTN HI CHSV (CAST SUPPLIES) IMPLANT
PADDING CAST COTTON 4X4 STRL (CAST SUPPLIES)
PADDING CAST SYNTHETIC 4 (CAST SUPPLIES)
PADDING CAST SYNTHETIC 4X4 STR (CAST SUPPLIES) IMPLANT
PENCIL BUTTON HOLSTER BLD 10FT (ELECTRODE) IMPLANT
SPONGE LAP 18X18 X RAY DECT (DISPOSABLE) IMPLANT
STOCKINETTE 4X48 STRL (DRAPES) ×1 IMPLANT
SUCTION FRAZIER HANDLE 10FR (MISCELLANEOUS) ×1
SUCTION TUBE FRAZIER 10FR DISP (MISCELLANEOUS) ×1 IMPLANT
SUT ETHILON 2 0 FS 18 (SUTURE) IMPLANT
SUT ETHILON 4 0 PS 2 18 (SUTURE) ×1 IMPLANT
SUT VIC AB 0 CT1 27 (SUTURE) ×2
SUT VIC AB 0 CT1 27XBRD ANBCTR (SUTURE) ×1 IMPLANT
SUT VIC AB 2-0 CT1 27 (SUTURE)
SUT VIC AB 2-0 CT1 TAPERPNT 27 (SUTURE) IMPLANT
SUT VIC AB 3-0 PS1 18 (SUTURE) ×2
SUT VIC AB 3-0 PS1 18XBRD (SUTURE) IMPLANT
SYR BULB 3OZ (MISCELLANEOUS) IMPLANT
TOWEL OR 17X24 6PK STRL BLUE (TOWEL DISPOSABLE) ×2 IMPLANT
TOWEL OR NON WOVEN STRL DISP B (DISPOSABLE) ×2 IMPLANT
YANKAUER SUCT BULB TIP NO VENT (SUCTIONS) IMPLANT

## 2017-03-18 NOTE — H&P (Signed)
PREOPERATIVE H&P  Chief Complaint: left wrist ganglion cyst  HPI: Hunter Pratt is a 61 y.o. male who presents for surgical treatment of left wrist ganglion cyst.  He denies any changes in medical history.  Past Medical History:  Diagnosis Date  . Arthritis   . Constipation 01/28/2017  . DDD (degenerative disc disease), cervical   . Ganglion cyst 01/28/2017  . GERD (gastroesophageal reflux disease)   . Hearing loss    Bilateral  . HIV infection ([place])   . Hyperlipidemia   . Kidney stone   . OA (osteoarthritis) of knee   . PUD (peptic ulcer disease)   . Systolic dysfunction    EF 40-45%   Past Surgical History:  Procedure Laterality Date  . CERVICAL DISCECTOMY  1990 and 1991   Social History   Social History  . Marital status: Divorced    Spouse name: N/A  . Number of children: N/A  . Years of education: N/A   Social History Main Topics  . Smoking status: Former Smoker    Types: Cigarettes  . Smokeless tobacco: Never Used  . Alcohol use No  . Drug use: No  . Sexual activity: No     Comment: refused condoms   Other Topics Concern  . None   Social History Narrative  . None   Family History  Problem Relation Age of Onset  . Hypertension Mother   . Osteoporosis Mother   . Osteoporosis Father   . Depression Father   . Hearing loss Father   . Osteoporosis Sister   . Arthritis Sister   . Hypertension Brother   . Hyperlipidemia Brother   . Depression Brother   . Colon cancer Neg Hx    Allergies  Allergen Reactions  . Naproxen Sodium     REACTION: kidney failure  . Nsaids     REACTION: GI upset  . Other Rash    Black dye   Prior to Admission medications   Medication Sig Start Date End Date Taking? Authorizing Provider  DESCOVY 200-25 MG tablet TAKE 1 TABLET BY MOUTH DAILY 12/12/16  Yes Truman Hayward, MD  fluocinonide ointment (LIDEX) 0.05 % Apply topically 2 (two) times daily. 12/08/16  Yes Alycia Rossetti, MD  INTELENCE 200 MG TABS Take 2  tablets (400 mg total) by mouth daily. 12/08/16  Yes Truman Hayward, MD  metoprolol succinate (TOPROL-XL) 25 MG 24 hr tablet TAKE 1/2 TABLET BY MOUTH EVERY DAY AS NEEDED FOR BLOOD PRESSURE 12/08/16  Yes Alycia Rossetti, MD  omeprazole (PRILOSEC) 40 MG capsule Take 1 capsule (40 mg total) by mouth daily. 01/01/17  Yes Alycia Rossetti, MD  pravastatin (PRAVACHOL) 20 MG tablet Take 1 tablet (20 mg total) by mouth daily. 12/08/16  Yes Alycia Rossetti, MD  Raltegravir Potassium (ISENTRESS HD) 600 MG TABS Take 1,200 mg by mouth daily. 12/08/16  Yes Truman Hayward, MD  traZODone (DESYREL) 50 MG tablet Take 1 tablet (50 mg total) by mouth at bedtime as needed for sleep. 12/08/16  Yes Alycia Rossetti, MD     Positive ROS: All other systems have been reviewed and were otherwise negative with the exception of those mentioned in the HPI and as above.  Physical Exam: General: Alert, no acute distress Cardiovascular: No pedal edema Respiratory: No cyanosis, no use of accessory musculature GI: abdomen soft Skin: No lesions in the area of chief complaint Neurologic: Sensation intact distally Psychiatric: Patient is competent  for consent with normal mood and affect Lymphatic: no lymphedema  MUSCULOSKELETAL: exam stable  Assessment: left wrist ganglion cyst  Plan: Plan for Procedure(s): REMOVAL GANGLION OF LEFT WRIST, TENOLYSIS  The risks benefits and alternatives were discussed with the patient including but not limited to the risks of nonoperative treatment, versus surgical intervention including infection, bleeding, nerve injury,  blood clots, cardiopulmonary complications, morbidity, mortality, among others, and they were willing to proceed.   Eduard Roux, MD   03/18/2017 8:23 AM

## 2017-03-18 NOTE — Anesthesia Postprocedure Evaluation (Signed)
Anesthesia Post Note  Patient: Hunter Pratt  Procedure(s) Performed: Procedure(s) (LRB): REMOVAL GANGLION OF LEFT WRIST, TENOLYSIS (Left)  Patient location during evaluation: PACU Anesthesia Type: General Level of consciousness: sedated and patient cooperative Pain management: pain level controlled Vital Signs Assessment: post-procedure vital signs reviewed and stable Respiratory status: spontaneous breathing Cardiovascular status: stable Anesthetic complications: no       Last Vitals:  Vitals:   03/18/17 0945 03/18/17 1000  BP: 102/71 120/86  Pulse: 74 73  Resp: 14 14  Temp:      Last Pain:  Vitals:   03/18/17 1000  TempSrc:   PainSc: 0-No pain                 Nolon Nations

## 2017-03-18 NOTE — Transfer of Care (Signed)
Immediate Anesthesia Transfer of Care Note  Patient: Hunter Pratt  Procedure(s) Performed: Procedure(s): REMOVAL GANGLION OF LEFT WRIST, TENOLYSIS (Left)  Patient Location: PACU  Anesthesia Type:General  Level of Consciousness: awake and patient cooperative  Airway & Oxygen Therapy: Patient Spontanous Breathing and Patient connected to face mask oxygen  Post-op Assessment: Report given to RN and Post -op Vital signs reviewed and stable  Post vital signs: Reviewed and stable  Last Vitals:  Vitals:   03/18/17 0718  BP: 118/80  Pulse: 68  Resp: 18  Temp: 36.6 C    Last Pain:  Vitals:   03/18/17 0718  TempSrc: Oral         Complications: No apparent anesthesia complications

## 2017-03-18 NOTE — Op Note (Signed)
   Date of Surgery: 03/18/2017  INDICATIONS: Hunter Pratt is a 61 y.o.-year-old male with a left wrist ganglion cyst;  The patient did consent to the procedure after discussion of the risks and benefits.  PREOPERATIVE DIAGNOSIS:  1. Left volar ganglion cyst 2. Tenosynovitis of brachioradialis and FCR tendon  POSTOPERATIVE DIAGNOSIS: Same.  PROCEDURE:  1. Left volar ganglion cyst removal 2. Tenolysis of brachioradialis and FCR tendon  SURGEON: Hunter Pratt, M.D.  ASSIST: none.  ANESTHESIA:  general  IV FLUIDS AND URINE: See anesthesia.  ESTIMATED BLOOD LOSS: minimal mL.  IMPLANTS: none  DRAINS: none  COMPLICATIONS: None.  DESCRIPTION OF PROCEDURE: The patient was brought to the operating room and placed supine on the operating table.  The patient had been signed prior to the procedure and this was documented. The patient had the anesthesia placed by the anesthesiologist.  A time-out was performed to confirm that this was the correct patient, site, side and location. The patient did receive antibiotics prior to the incision and was re-dosed during the procedure as needed at indicated intervals.  A tourniquet was placed.  The patient had the operative extremity prepped and draped in the standard surgical fashion.    A longitudinal incision directly over the cyst was created. Dissection was carried through the subcutaneous cutaneous tissue. The ganglion cyst and its borders were identified. Beginning assist was wrapped around the radial artery. Careful blunt dissection was carried out with dissecting scissors in order to remove the ganglion cyst as a whole. The stalk of the ganglion cyst was followed down to the volar aspect of the distal radius. The entire stalk was removed as a whole. I then inspected the brachioradialis and flexor carpi radialis tendons. They both exhibited extensive synovitis. Tenolysis was then performed of each tendon using dissecting scissors. The tourniquet was then  deflated. Hemostasis was obtained. There is no evidence of radial artery injury. The wound was then thoroughly irrigated and closed in layer fashion using 3-0 Vicryl and 4-0 nylon. The ganglion cyst was sent to pathology.  Sterile dressings were applied. Patient tolerated procedure well and had no immediate complications.  POSTOPERATIVE PLAN: Patient will follow-up in the office in 2 weeks for suture removal.  Hunter Cecil, MD [place] [phone] 9:22 AM

## 2017-03-18 NOTE — Discharge Instructions (Signed)
Post Anesthesia Home Care Instructions  Activity: Get plenty of rest for the remainder of the day. A responsible individual must stay with you for 24 hours following the procedure.  For the next 24 hours, DO NOT: -Drive a car -Paediatric nurse -Drink alcoholic beverages -Take any medication unless instructed by your physician -Make any legal decisions or sign important papers.  Meals: Start with liquid foods such as gelatin or soup. Progress to regular foods as tolerated. Avoid greasy, spicy, heavy foods. If nausea and/or vomiting occur, drink only clear liquids until the nausea and/or vomiting subsides. Call your physician if vomiting continues.  Special Instructions/Symptoms: Your throat may feel dry or sore from the anesthesia or the breathing tube placed in your throat during surgery. If this causes discomfort, gargle with warm salt water. The discomfort should disappear within 24 hours.  If you had a scopolamine patch placed behind your ear for the management of post- operative nausea and/or vomiting:  1. The medication in the patch is effective for 72 hours, after which it should be removed.  Wrap patch in a tissue and discard in the trash. Wash hands thoroughly with soap and water. 2. You may remove the patch earlier than 72 hours if you experience unpleasant side effects which may include dry mouth, dizziness or visual disturbances. 3. Avoid touching the patch. Wash your hands with soap and water after contact with the patch.      Postoperative instructions:  Weightbearing instructions: as tolerated, limit excessive wrist and hand use during the first 2 weeks after surgery  Keep your dressing and/or splint clean and dry at all times.  You can remove your dressing on post-operative day #3 and change with a dry/sterile dressing or Band-Aids as needed thereafter.    Incision instructions:  Do not soak your incision for 3 weeks after surgery.  If the incision gets wet, pat dry  and do not scrub the incision.  Pain control:  You have been given a prescription to be taken as directed for post-operative pain control.  In addition, elevate the operative extremity above the heart at all times to prevent swelling and throbbing pain.  Take over-the-counter Colace, 100mg  by mouth twice a day while taking narcotic pain medications to help prevent constipation.  Follow up appointments: 1) 10-14 days for suture removal and wound check. 2) Dr. Erlinda Hong as scheduled.   -------------------------------------------------------------------------------------------------------------  After Surgery Pain Control:  After your surgery, post-surgical discomfort or pain is likely. This discomfort can last several days to a few weeks. At certain times of the day your discomfort may be more intense.  Did you receive a nerve block?  A nerve block can provide pain relief for one hour to two days after your surgery. As long as the nerve block is working, you will experience little or no sensation in the area the surgeon operated on.  As the nerve block wears off, you will begin to experience pain or discomfort. It is very important that you begin taking your prescribed pain medication before the nerve block fully wears off. Treating your pain at the first sign of the block wearing off will ensure your pain is better controlled and more tolerable when full-sensation returns. Do not wait until the pain is intolerable, as the medicine will be less effective. It is better to treat pain in advance than to try and catch up.  General Anesthesia:  If you did not receive a nerve block during your surgery, you will need to start  taking your pain medication shortly after your surgery and should continue to do so as prescribed by your surgeon.  Pain Medication:  Most commonly we prescribe Vicodin and Percocet for post-operative pain. Both of these medications contain a combination of acetaminophen (Tylenol) and a  narcotic to help control pain.   It takes between 30 and 45 minutes before pain medication starts to work. It is important to take your medication before your pain level gets too intense.   Nausea is a common side effect of many pain medications. You will want to eat something before taking your pain medicine to help prevent nausea.   If you are taking a prescription pain medication that contains acetaminophen, we recommend that you do not take additional over the counter acetaminophen (Tylenol).  Other pain relieving options:   Using a cold pack to ice the affected area a few times a day (15 to 20 minutes at a time) can help to relieve pain, reduce swelling and bruising.   Elevation of the affected area can also help to reduce pain and swelling.

## 2017-03-18 NOTE — Anesthesia Procedure Notes (Signed)
Procedure Name: LMA Insertion Date/Time: 03/18/2017 8:35 AM Performed by: Marilyne Haseley D Pre-anesthesia Checklist: Patient identified, Emergency Drugs available, Suction available and Patient being monitored Patient Re-evaluated:Patient Re-evaluated prior to inductionOxygen Delivery Method: Circle system utilized Preoxygenation: Pre-oxygenation with 100% oxygen Intubation Type: IV induction Ventilation: Mask ventilation without difficulty LMA: LMA inserted LMA Size: 4.0 Number of attempts: 1 Airway Equipment and Method: Bite block Placement Confirmation: positive ETCO2 Tube secured with: Tape Dental Injury: Teeth and Oropharynx as per pre-operative assessment

## 2017-03-19 ENCOUNTER — Encounter (HOSPITAL_BASED_OUTPATIENT_CLINIC_OR_DEPARTMENT_OTHER): Payer: Self-pay | Admitting: Orthopaedic Surgery

## 2017-03-19 ENCOUNTER — Encounter: Payer: Self-pay | Admitting: Family Medicine

## 2017-03-19 MED ORDER — FLUOCINONIDE 0.05 % EX OINT
TOPICAL_OINTMENT | Freq: Two times a day (BID) | CUTANEOUS | 3 refills | Status: DC
Start: 2017-03-19 — End: 2017-03-23

## 2017-03-21 ENCOUNTER — Other Ambulatory Visit: Payer: Self-pay | Admitting: Family Medicine

## 2017-03-23 MED ORDER — FLUOCINONIDE 0.05 % EX OINT: TOPICAL_OINTMENT | Freq: Two times a day (BID) | CUTANEOUS | 3 refills | Status: DC

## 2017-03-23 NOTE — Addendum Note (Signed)
Addended by: Sheral Flow on: 03/23/2017 07:59 AM   Modules accepted: Orders

## 2017-03-30 ENCOUNTER — Encounter: Payer: Self-pay | Admitting: Family Medicine

## 2017-04-02 ENCOUNTER — Ambulatory Visit (INDEPENDENT_AMBULATORY_CARE_PROVIDER_SITE_OTHER): Payer: Medicare Other | Admitting: Orthopaedic Surgery

## 2017-04-02 ENCOUNTER — Encounter (INDEPENDENT_AMBULATORY_CARE_PROVIDER_SITE_OTHER): Payer: Self-pay | Admitting: Orthopaedic Surgery

## 2017-04-02 DIAGNOSIS — M67432 Ganglion, left wrist: Secondary | ICD-10-CM

## 2017-04-02 NOTE — Progress Notes (Signed)
Patient is 2 weeks status post left wrist ganglion cyst removal. He is doing well. He endorses some mild soreness and discomfort with activity. Denies any constitutional symptoms. The incisions healed we removed the sutures today increase activity as tolerated follow up with me as needed. The pathology did show was a routine ganglion cyst.

## 2017-04-06 ENCOUNTER — Other Ambulatory Visit: Payer: Self-pay | Admitting: Infectious Disease

## 2017-04-27 ENCOUNTER — Encounter: Payer: Self-pay | Admitting: Family Medicine

## 2017-06-10 ENCOUNTER — Other Ambulatory Visit (HOSPITAL_COMMUNITY)
Admission: RE | Admit: 2017-06-10 | Discharge: 2017-06-10 | Disposition: A | Discharge status: Home / Self Care | Payer: Medicare Other | Source: Ambulatory Visit | Attending: Infectious Disease | Admitting: Infectious Disease

## 2017-06-10 ENCOUNTER — Other Ambulatory Visit: Payer: Medicare Other

## 2017-06-10 ENCOUNTER — Ambulatory Visit: Payer: Medicare Other

## 2017-06-10 DIAGNOSIS — B20 Human immunodeficiency virus [HIV] disease: Secondary | ICD-10-CM | POA: Diagnosis not present

## 2017-06-10 LAB — COMPLETE METABOLIC PANEL WITH GFR
ALT: 13 U/L (ref 9–46)
AST: 13 U/L (ref 10–35)
Albumin: 4.2 g/dL (ref 3.6–5.1)
Alkaline Phosphatase: 101 U/L (ref 40–115)
BUN: 15 mg/dL (ref 7–25)
CO2: 23 mmol/L (ref 20–31)
Calcium: 9.8 mg/dL (ref 8.6–10.3)
Chloride: 101 mmol/L (ref 98–110)
Creat: 1.11 mg/dL (ref 0.70–1.33)
GFR, Est African American: 84 mL/min (ref >=60)
GFR, Est Non African American: 72 mL/min (ref >=60)
Glucose, Bld: 98 mg/dL (ref 65–99)
Potassium: 4.3 mmol/L (ref 3.5–5.3)
Sodium: 136 mmol/L (ref 135–146)
Total Bilirubin: 0.4 mg/dL (ref 0.2–1.2)
Total Protein: 7.7 g/dL (ref 6.1–8.1)

## 2017-06-10 LAB — CBC WITH DIFFERENTIAL/PLATELET
Basophils Absolute: 98 cells/uL (ref 0–200)
Basophils Relative: 1 %
Eosinophils Absolute: 490 cells/uL (ref 15–500)
Eosinophils Relative: 5 %
HCT: 49.5 % (ref 38.5–50.0)
Hemoglobin: 16.9 g/dL (ref 13.2–17.1)
Lymphocytes Relative: 29 %
Lymphs Abs: 2842 cells/uL (ref 850–3900)
MCH: 32.8 pg (ref 27.0–33.0)
MCHC: 34.1 g/dL (ref 32.0–36.0)
MCV: 96.1 fL (ref 80.0–100.0)
MPV: 9.8 fL (ref 7.5–12.5)
Monocytes Absolute: 980 cells/uL — ABNORMAL HIGH (ref 200–950)
Monocytes Relative: 10 %
Neutro Abs: 5390 cells/uL (ref 1500–7800)
Neutrophils Relative %: 55 %
Platelets: 316 10*3/uL (ref 140–400)
RBC: 5.15 MIL/uL (ref 4.20–5.80)
RDW: 13.8 % (ref 11.0–15.0)
WBC: 9.8 10*3/uL (ref 3.8–10.8)

## 2017-06-11 LAB — T-HELPER CELL (CD4) - (RCID CLINIC ONLY)
CD4 % Helper T Cell: 20 % — ABNORMAL LOW (ref 33–55)
CD4 T Cell Abs: 670 /uL (ref 400–2700)

## 2017-06-11 LAB — URINE CYTOLOGY ANCILLARY ONLY
Chlamydia: NEGATIVE
Neisseria Gonorrhea: NEGATIVE

## 2017-06-12 LAB — HIV-1 RNA QUANT-NO REFLEX-BLD
HIV 1 RNA Quant: <20 copies/mL
HIV-1 RNA Quant, Log: <1.3 Log copies/mL

## 2017-06-23 ENCOUNTER — Encounter: Payer: Self-pay | Admitting: Infectious Disease

## 2017-06-29 ENCOUNTER — Ambulatory Visit: Payer: Medicare Other | Admitting: Family Medicine

## 2017-07-01 ENCOUNTER — Ambulatory Visit: Payer: Medicare Other | Admitting: Infectious Disease

## 2017-07-21 ENCOUNTER — Encounter: Payer: Self-pay | Admitting: Infectious Disease

## 2017-07-21 ENCOUNTER — Ambulatory Visit (INDEPENDENT_AMBULATORY_CARE_PROVIDER_SITE_OTHER): Payer: Medicare Other | Admitting: Infectious Disease

## 2017-07-21 VITALS — BP 124/82 | HR 86 | Temp 98.1°F | Wt 225.0 lb

## 2017-07-21 DIAGNOSIS — B20 Human immunodeficiency virus [HIV] disease: Secondary | ICD-10-CM | POA: Diagnosis not present

## 2017-07-21 DIAGNOSIS — F0630 Mood disorder due to known physiological condition, unspecified: Secondary | ICD-10-CM | POA: Diagnosis not present

## 2017-07-21 DIAGNOSIS — Z113 Encounter for screening for infections with a predominantly sexual mode of transmission: Secondary | ICD-10-CM

## 2017-07-21 DIAGNOSIS — Z79899 Other long term (current) drug therapy: Secondary | ICD-10-CM | POA: Diagnosis not present

## 2017-07-21 DIAGNOSIS — E782 Mixed hyperlipidemia: Secondary | ICD-10-CM

## 2017-07-21 NOTE — Progress Notes (Signed)
Chief complaint: Follow-up for HIV on medications.  Subjective:    Patient ID: Hunter Pratt, male    DOB: 1956-05-18, 61 y.o.   MRN: [id number]  HPI  61 y.o. male who is doing superbly well on his antiviral regimen, of intelence, Isentress both QDaily and Descovy once dailyi with undetectable viral load and health cd4 count.  (he is on this regimen after switch from Atripla was made in the context of hearing loss blamed on Sustiva. 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 Quentin Cornwall and time wanted him on 3 active drugs    Lab Results  Component Value Date   HIV1RNAQUANT <20 NOT DETECTED 06/10/2017   HIV1RNAQUANT <20 12/10/2016   HIV1RNAQUANT <20 11/19/2016    Lab Results  Component Value Date   CD4TABS 670 06/10/2017   CD4TABS 670 12/10/2016   CD4TABS 1,050 11/19/2016   Past Medical History:  Diagnosis Date  . Arthritis   . Constipation 01/28/2017  . DDD (degenerative disc disease), cervical   . Ganglion cyst 01/28/2017  . GERD (gastroesophageal reflux disease)   . Hearing loss    Bilateral  . HIV infection ([place])   . Hyperlipidemia   . Kidney stone   . OA (osteoarthritis) of knee   . PUD (peptic ulcer disease)   . Systolic dysfunction    EF 40-45%    Past Surgical History:  Procedure Laterality Date  . CERVICAL DISCECTOMY  1990 and 1991  . GANGLION CYST EXCISION Left 03/18/2017   Procedure: REMOVAL GANGLION OF LEFT WRIST, TENOLYSIS;  Surgeon: Leandrew Koyanagi, MD;  Location: [facility];  Service: Orthopedics;  Laterality: Left;    Family History  Problem Relation Age of Onset  . Hypertension Mother   . Osteoporosis Mother   . Osteoporosis Father   . Depression Father   . Hearing loss Father   . Osteoporosis Sister   . Arthritis Sister   . Hypertension Brother   . Hyperlipidemia Brother   . Depression Brother   . Colon cancer Neg Hx       Social History   Social History  . Marital status:  Divorced    Spouse name: N/A  . Number of children: N/A  . Years of education: N/A   Social History Main Topics  . Smoking status: Former Smoker    Types: Cigarettes  . Smokeless tobacco: Never Used  . Alcohol use No  . Drug use: No  . Sexual activity: No     Comment: refused condoms   Other Topics Concern  . None   Social History Narrative  . None    Allergies  Allergen Reactions  . Naproxen Sodium     REACTION: kidney failure  . Nsaids     REACTION: GI upset  . Other Rash    Black dye     Current Outpatient Prescriptions:  .  DESCOVY 200-25 MG tablet, TAKE 1 TABLET BY MOUTH DAILY, Disp: 30 tablet, Rfl: 4 .  fluocinonide ointment (LIDEX) 0.05 %, APPLY EXTERNALLY TO THE AFFECTED AREA TWICE DAILY, Disp: 30 g, Rfl: 0 .  fluocinonide ointment (LIDEX) 0.05 %, Apply topically 2 (two) times daily., Disp: 90 g, Rfl: 3 .  INTELENCE 200 MG TABS, Take 2 tablets (400 mg total) by mouth daily., Disp: 60 tablet, Rfl: 11 .  metoprolol succinate (TOPROL-XL) 25 MG 24 hr tablet, TAKE 1/2 TABLET BY MOUTH EVERY DAY AS NEEDED FOR BLOOD  PRESSURE, Disp: 45 tablet, Rfl: 3 .  omeprazole (PRILOSEC) 40 MG capsule, Take 1 capsule (40 mg total) by mouth daily., Disp: 90 capsule, Rfl: 3 .  pravastatin (PRAVACHOL) 20 MG tablet, Take 1 tablet (20 mg total) by mouth daily., Disp: 90 tablet, Rfl: 3 .  Raltegravir Potassium (ISENTRESS HD) 600 MG TABS, Take 1,200 mg by mouth daily., Disp: 60 tablet, Rfl: 11 .  traZODone (DESYREL) 50 MG tablet, Take 1 tablet (50 mg total) by mouth at bedtime as needed for sleep., Disp: 90 tablet, Rfl: 3 .  HYDROcodone-acetaminophen (NORCO) 5-325 MG tablet, Take 1 tablet by mouth every 6 (six) hours as needed. (Patient not taking: Reported on 07/21/2017), Disp: 40 tablet, Rfl: 0 .  promethazine (PHENERGAN) 25 MG tablet, Take 1 tablet (25 mg total) by mouth every 6 (six) hours as needed for nausea. (Patient not taking: Reported on 07/21/2017), Disp: 30 tablet, Rfl: 1  Review of  Systems  Constitutional: Negative for chills and fever.  HENT: Negative for congestion and sore throat.   Eyes: Negative for photophobia.  Respiratory: Negative for cough, shortness of breath and wheezing.   Cardiovascular: Negative for chest pain, palpitations and leg swelling.  Gastrointestinal: Negative for abdominal pain, blood in stool, constipation, diarrhea, nausea and vomiting.  Genitourinary: Negative for dysuria, flank pain and hematuria.  Musculoskeletal: Negative for back pain and myalgias.  Skin: Negative for rash.  Neurological: Negative for dizziness, weakness and headaches.  Hematological: Does not bruise/bleed easily.  Psychiatric/Behavioral: Negative for suicidal ideas.       Objective:   Physical Exam  Constitutional: He is oriented to person, place, and time. He appears well-developed and well-nourished. No distress.  HENT:  Head: Normocephalic and atraumatic.  Mouth/Throat: No oropharyngeal exudate.  Eyes: Conjunctivae and EOM are normal. No scleral icterus.  Neck: Normal range of motion. Neck supple.  Cardiovascular: Normal rate and regular rhythm.   Pulmonary/Chest: Effort normal. No respiratory distress. He has no wheezes.  Abdominal: He exhibits no distension.  Musculoskeletal: He exhibits no edema or tenderness.  Neurological: He is alert and oriented to person, place, and time. He exhibits normal muscle tone. Coordination normal.  Skin: Skin is warm and dry. No rash noted. He is not diaphoretic. No erythema. No pallor.  Psychiatric: He has a normal mood and affect. His behavior is normal. Judgment and thought content normal.     Ganglion cyst left wrist:      Assessment & Plan:   HIV: well controlled on  ISENTRESS HD 600mg  TWO PILLS ONCE DAILY with TWO INTELENCE PILLS ONCE daily and one Descovy once daily  And CONSIDER change to BIKTARVY in future which would be only 2 fully active drugs but with one with high barrier to R and he are to showed he  could suppress his virus with 2 active drugs when he was on Atripla.   HTN: BP much better controlled. Vitals:   07/21/17 1538  BP: 124/82  Pulse: 86  Temp: 98.1 F (36.7 C)  SpO2: 97%     Hyperlipidemia: followed by PCP

## 2017-08-06 ENCOUNTER — Other Ambulatory Visit: Payer: Self-pay | Admitting: Infectious Disease

## 2017-08-06 DIAGNOSIS — B20 Human immunodeficiency virus [HIV] disease: Secondary | ICD-10-CM

## 2017-09-03 ENCOUNTER — Other Ambulatory Visit: Payer: Self-pay | Admitting: Infectious Disease

## 2017-09-03 DIAGNOSIS — B20 Human immunodeficiency virus [HIV] disease: Secondary | ICD-10-CM

## 2017-09-21 DIAGNOSIS — Z23 Encounter for immunization: Secondary | ICD-10-CM | POA: Diagnosis not present

## 2017-09-27 IMAGING — CT CT ABD-PELV W/O CM
2 of 4 series · 16 of 46 positions shown, 18 images · non-contrast
Comparison: 05/02/2013

CLINICAL DATA: Left side abdomen and left flank pain on/off since
[DATE], microscopic hematuria, hx kidney stones. Pt states bladder
surg at age 9 due to urethra infection.

EXAM:
CT ABDOMEN AND PELVIS WITHOUT CONTRAST
TECHNIQUE: Multidetector CT imaging of the abdomen and pelvis was performed
following the standard protocol without IV contrast.

[Series 2: axial st · axial · 0.90mm/px · z∈[+978,+1393]mm · 13 of 93 slices shown, 15 images]
[im 5/93  soft-tissue]
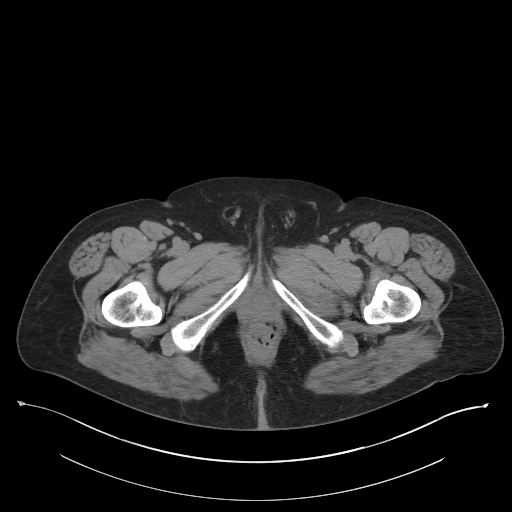
[im 5/93  bone]
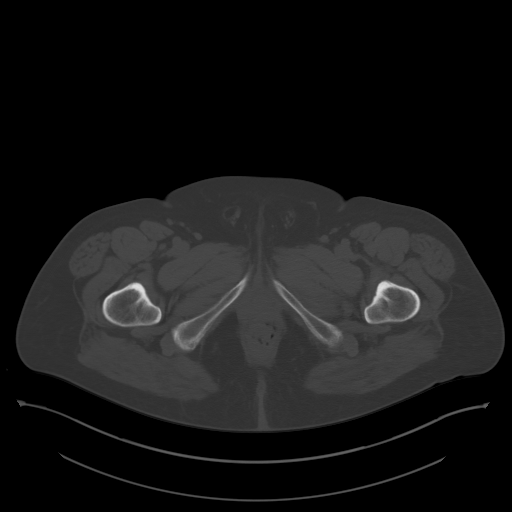
[im 13/93  soft-tissue]
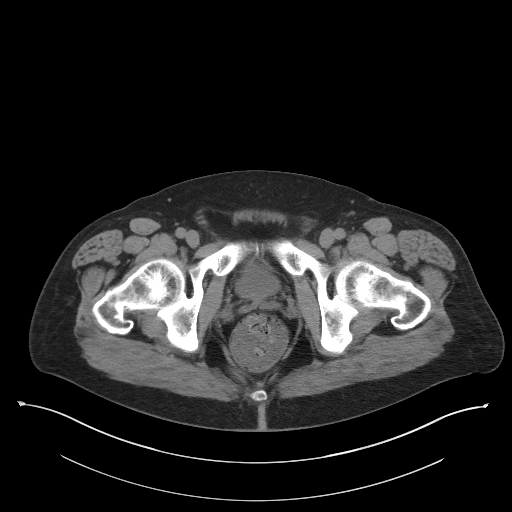
[im 21/93  soft-tissue]
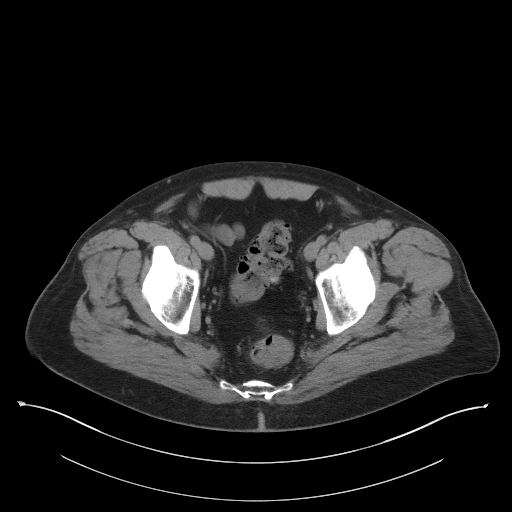
[im 26/93  soft-tissue]
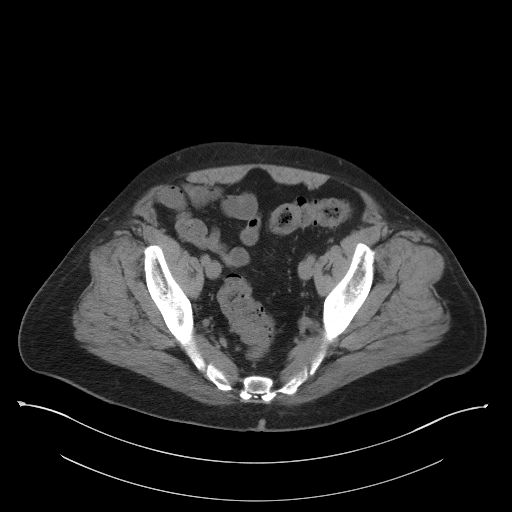
[im 34/93  soft-tissue]
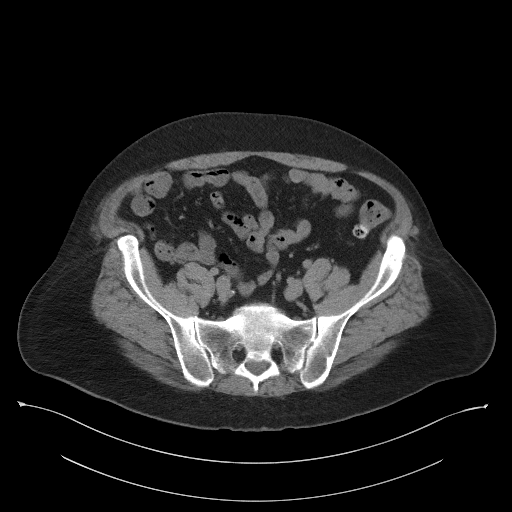
[im 38/93  soft-tissue]
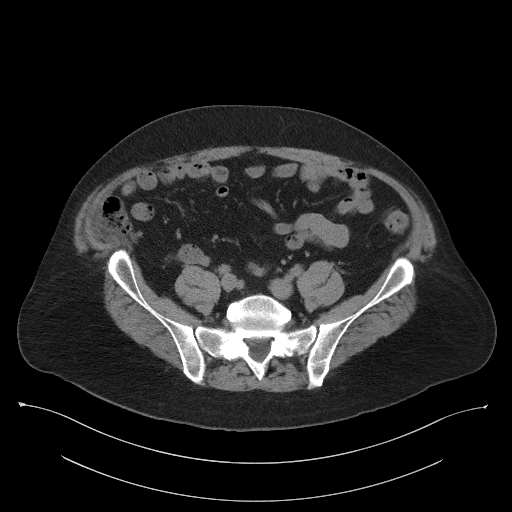
[im 47/93  soft-tissue]
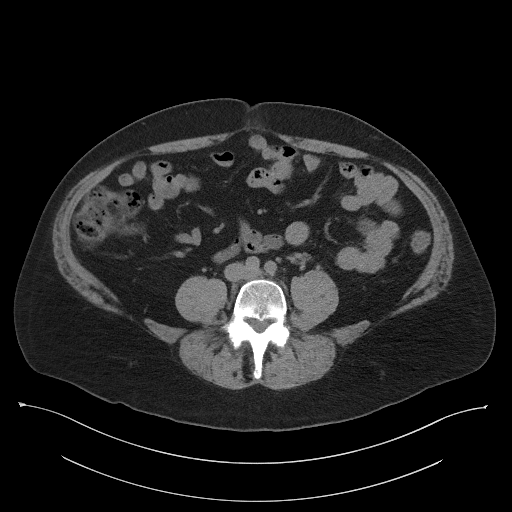
[im 55/93  soft-tissue]
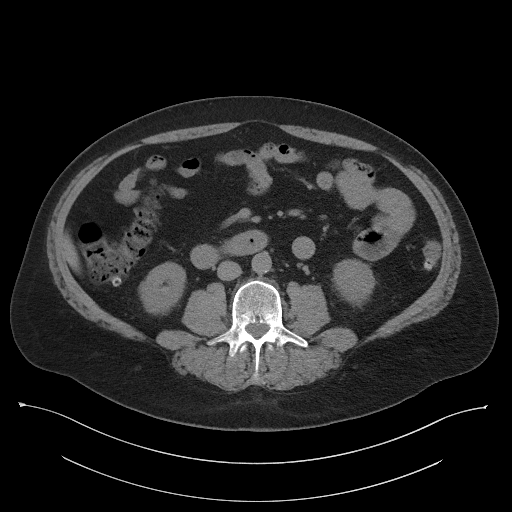
[im 59/93  soft-tissue]
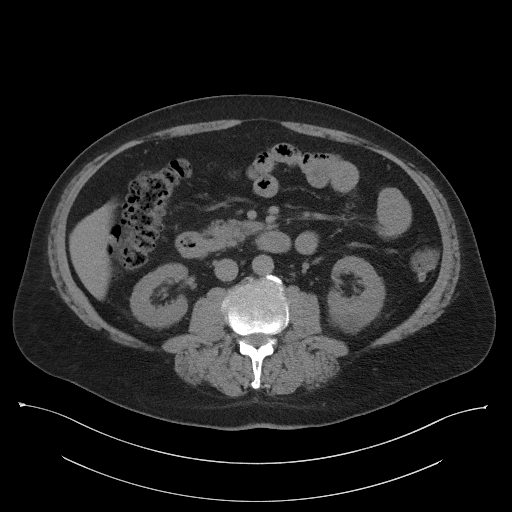
[im 59/93  bone]
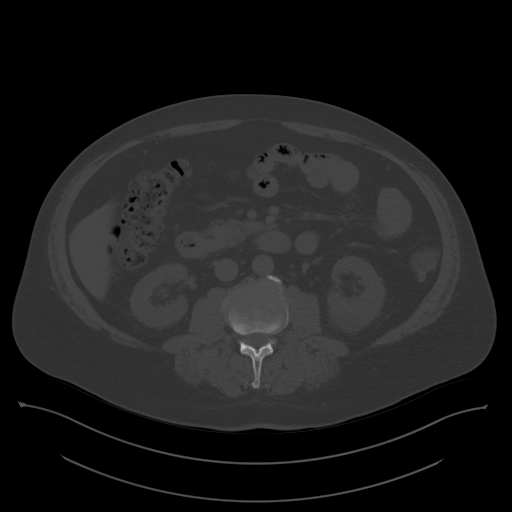
[im 67/93  soft-tissue]
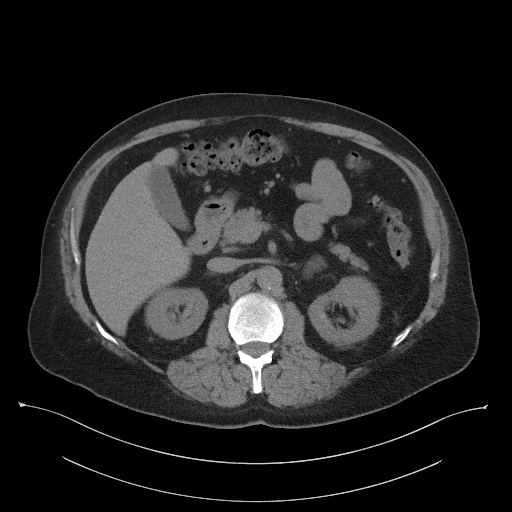
[im 72/93  soft-tissue]
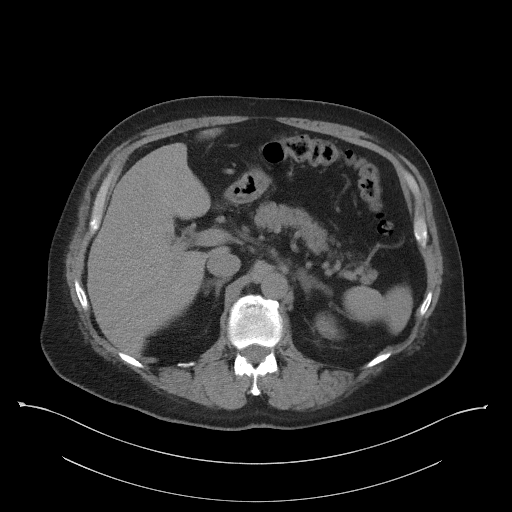
[im 80/93  soft-tissue]
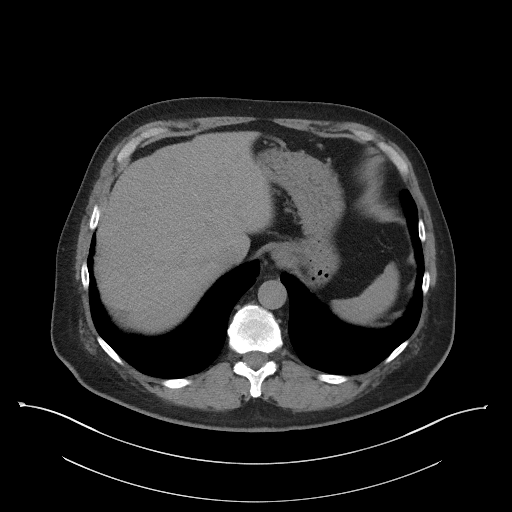
[im 88/93  soft-tissue]
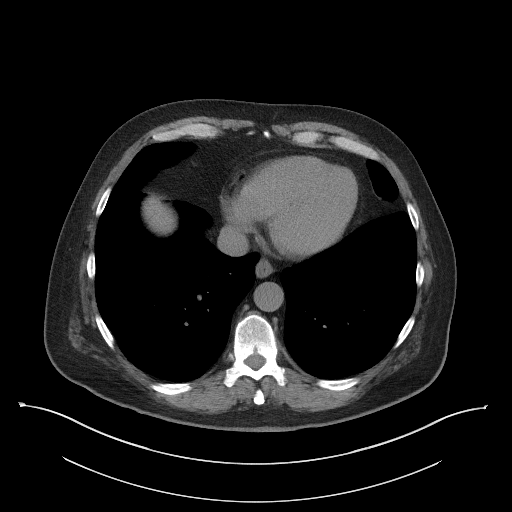

[Series 5: coronal st · coronal · 0.90mm/px · 3 of 112 slices shown]
[im 38/112  soft-tissue]
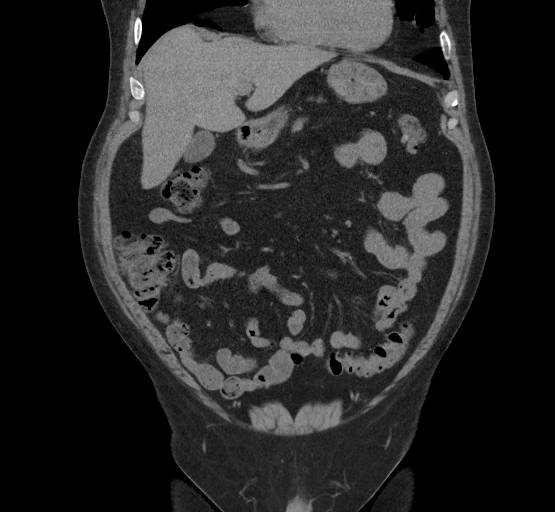
[im 50/112  soft-tissue]
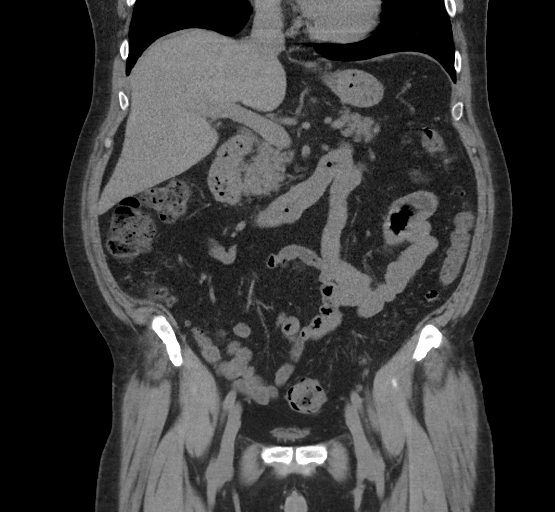
[im 62/112  soft-tissue]
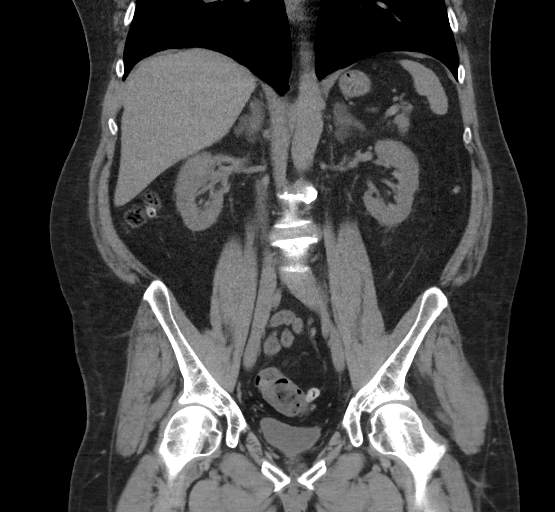

[16 of 46 positions shown; findings below may reference images not displayed]

FINDINGS: Lower chest: Stable linear subsegmental atelectasis or scar in the
lingula.

Hepatobiliary: Unremarkable

Pancreas: Unremarkable

Spleen: Unremarkable

Adrenals/Urinary Tract: Low-density fullness of both adrenal glands
similar to prior, small left adrenal adenoma.

Fluid density 2.2 by 3.5 cm left mid kidney lesion posteriorly, 0
Hounsfield units. Slightly bilobed appearance.

Stomach/Bowel: Scattered colonic diverticula. Appendix normal. Mild
prominence of colonic stool. No current active diverticulitis.

Vascular/Lymphatic: Mild aortoiliac atherosclerotic vascular
disease. Small upper abdominal lymph nodes are not pathologically
enlarged by size criteria.

Reproductive: Unremarkable

Other: No supplemental non-categorized findings.

Musculoskeletal: Lumbar spondylosis and degenerative disc disease
with suspected impingement at L3-4 and L4-5. Mild degenerative grade
1 retrolisthesis at L2- 3 and anterolisthesis at L4-5.

Small umbilical hernia contains adipose tissue.
IMPRESSION: 1. A specific cause for the patient's left flank pain is not seen.
There are scattered colonic diverticula more concentrated in the
descending and sigmoid colon.
2. Mild constipation.
3. Small left adrenal adenoma.
4. Fluid density left mid kidney lesion posteriorly, likely a cyst
or confluence of cysts. Enhancement characteristics are not studied
today.
5. Mild aortoiliac atherosclerotic vascular disease.
6. Lumbar spondylosis and degenerative disc disease causing
impingement at L3-4 and L4-5.

## 2017-10-30 ENCOUNTER — Other Ambulatory Visit: Payer: Self-pay | Admitting: Infectious Disease

## 2017-10-30 DIAGNOSIS — B20 Human immunodeficiency virus [HIV] disease: Secondary | ICD-10-CM

## 2017-12-02 ENCOUNTER — Other Ambulatory Visit: Payer: Self-pay | Admitting: Infectious Disease

## 2017-12-02 ENCOUNTER — Other Ambulatory Visit: Payer: Self-pay | Admitting: Family Medicine

## 2017-12-02 DIAGNOSIS — B20 Human immunodeficiency virus [HIV] disease: Secondary | ICD-10-CM

## 2017-12-07 ENCOUNTER — Other Ambulatory Visit: Payer: Medicare Other

## 2017-12-07 DIAGNOSIS — B20 Human immunodeficiency virus [HIV] disease: Secondary | ICD-10-CM

## 2017-12-07 DIAGNOSIS — Z79899 Other long term (current) drug therapy: Secondary | ICD-10-CM

## 2017-12-07 DIAGNOSIS — Z113 Encounter for screening for infections with a predominantly sexual mode of transmission: Secondary | ICD-10-CM

## 2017-12-07 DIAGNOSIS — E782 Mixed hyperlipidemia: Secondary | ICD-10-CM

## 2017-12-08 LAB — CBC WITH DIFFERENTIAL/PLATELET
Basophils Absolute: 174 cells/uL (ref 0–200)
Basophils Relative: 1.4 %
Eosinophils Absolute: 856 cells/uL — ABNORMAL HIGH (ref 15–500)
Eosinophils Relative: 6.9 %
HCT: 49.6 % (ref 38.5–50.0)
Hemoglobin: 17.8 g/dL — ABNORMAL HIGH (ref 13.2–17.1)
Lymphs Abs: 2802 cells/uL (ref 850–3900)
MCH: 33.2 pg — ABNORMAL HIGH (ref 27.0–33.0)
MCHC: 35.9 g/dL (ref 32.0–36.0)
MCV: 92.5 fL (ref 80.0–100.0)
MPV: 10.1 fL (ref 7.5–12.5)
Monocytes Relative: 8.2 %
Neutro Abs: 7552 cells/uL (ref 1500–7800)
Neutrophils Relative %: 60.9 %
Platelets: 316 10*3/uL (ref 140–400)
RBC: 5.36 10*6/uL (ref 4.20–5.80)
RDW: 13 % (ref 11.0–15.0)
Total Lymphocyte: 22.6 %
WBC mixed population: 1017 cells/uL — ABNORMAL HIGH (ref 200–950)
WBC: 12.4 10*3/uL — ABNORMAL HIGH (ref 3.8–10.8)

## 2017-12-08 LAB — COMPLETE METABOLIC PANEL WITH GFR
AG Ratio: 1.2 (calc) (ref 1.0–2.5)
ALT: 12 U/L (ref 9–46)
AST: 13 U/L (ref 10–35)
Albumin: 4.4 g/dL (ref 3.6–5.1)
Alkaline phosphatase (APISO): 113 U/L (ref 40–115)
BUN/Creatinine Ratio: 12 (calc) (ref 6–22)
BUN: 16 mg/dL (ref 7–25)
CO2: 29 mmol/L (ref 20–32)
Calcium: 9.9 mg/dL (ref 8.6–10.3)
Chloride: 104 mmol/L (ref 98–110)
Creat: 1.34 mg/dL — ABNORMAL HIGH (ref 0.70–1.25)
GFR, Est African American: 66 mL/min/{1.73_m2} (ref >=60)
GFR, Est Non African American: 57 mL/min/{1.73_m2} — ABNORMAL LOW (ref >=60)
Globulin: 3.6 g/dL (calc) (ref 1.9–3.7)
Glucose, Bld: 107 mg/dL — ABNORMAL HIGH (ref 65–99)
Potassium: 4.5 mmol/L (ref 3.5–5.3)
Sodium: 140 mmol/L (ref 135–146)
Total Bilirubin: 0.3 mg/dL (ref 0.2–1.2)
Total Protein: 8 g/dL (ref 6.1–8.1)

## 2017-12-08 LAB — LIPID PANEL
Cholesterol: 140 mg/dL (ref <200)
HDL: 38 mg/dL — ABNORMAL LOW (ref >40)
LDL Cholesterol (Calc): 78 mg/dL (calc)
Non-HDL Cholesterol (Calc): 102 mg/dL (calc) (ref <130)
Total CHOL/HDL Ratio: 3.7 (calc) (ref <5.0)
Triglycerides: 138 mg/dL (ref <150)

## 2017-12-08 LAB — T-HELPER CELL (CD4) - (RCID CLINIC ONLY)
CD4 % Helper T Cell: 20 % — ABNORMAL LOW (ref 33–55)
CD4 T Cell Abs: 560 /uL (ref 400–2700)

## 2017-12-08 LAB — RPR: RPR Ser Ql: NONREACTIVE

## 2017-12-09 ENCOUNTER — Other Ambulatory Visit: Payer: Self-pay | Admitting: Infectious Disease

## 2017-12-09 LAB — HIV-1 RNA QUANT-NO REFLEX-BLD
HIV 1 RNA Quant: <20 copies/mL — AB
HIV-1 RNA Quant, Log: <1.3 Log copies/mL — AB

## 2017-12-21 ENCOUNTER — Ambulatory Visit (INDEPENDENT_AMBULATORY_CARE_PROVIDER_SITE_OTHER): Payer: Medicare Other | Admitting: Infectious Disease

## 2017-12-21 ENCOUNTER — Encounter: Payer: Self-pay | Admitting: Infectious Disease

## 2017-12-21 VITALS — BP 147/93 | HR 81 | Temp 97.9°F | Ht 71.0 in | Wt 235.0 lb

## 2017-12-21 DIAGNOSIS — K219 Gastro-esophageal reflux disease without esophagitis: Secondary | ICD-10-CM

## 2017-12-21 DIAGNOSIS — B20 Human immunodeficiency virus [HIV] disease: Secondary | ICD-10-CM

## 2017-12-21 DIAGNOSIS — I10 Essential (primary) hypertension: Secondary | ICD-10-CM

## 2017-12-21 MED ORDER — BICTEGRAVIR-EMTRICITAB-TENOFOV 50-200-25 MG PO TABS: 1.0000 | ORAL_TABLET | Freq: Every day | ORAL | 11 refills | Status: DC

## 2017-12-21 NOTE — Progress Notes (Signed)
Chief complaint: Follow-up for HIV on medications.  Subjective:    Patient ID: Hunter Pratt, male    DOB: [birth year], 62 y.o.   MRN: [id number]  HPI  62  Year old male who is doing superbly well on his antiviral regimen, of intelence, Isentress both QDaily and Descovy once dailyi with undetectable viral load and health cd4 count.     Lab Results  Component Value Date   HIV1RNAQUANT <20 DETECTED (A) 12/07/2017   HIV1RNAQUANT <20 NOT DETECTED 06/10/2017   HIV1RNAQUANT <20 12/10/2016    Lab Results  Component Value Date   CD4TABS 560 12/07/2017   CD4TABS 670 06/10/2017   CD4TABS 670 12/10/2016    I reviewed [place];'s genotypes and we decided to simplify his regimen today  Past Medical History:  Diagnosis Date  . Arthritis   . Constipation 01/28/2017  . DDD (degenerative disc disease), cervical   . Ganglion cyst 01/28/2017  . GERD (gastroesophageal reflux disease)   . Hearing loss    Bilateral  . HIV infection ([place])   . Hyperlipidemia   . Kidney stone   . OA (osteoarthritis) of knee   . PUD (peptic ulcer disease)   . Systolic dysfunction    EF 40-45%    Past Surgical History:  Procedure Laterality Date  . CERVICAL DISCECTOMY  1990 and 1991  . GANGLION CYST EXCISION Left 03/18/2017   Procedure: REMOVAL GANGLION OF LEFT WRIST, TENOLYSIS;  Surgeon: Leandrew Koyanagi, MD;  Location: [facility];  Service: Orthopedics;  Laterality: Left;    Family History  Problem Relation Age of Onset  . Hypertension Mother   . Osteoporosis Mother   . Osteoporosis Father   . Depression Father   . Hearing loss Father   . Osteoporosis Sister   . Arthritis Sister   . Hypertension Brother   . Hyperlipidemia Brother   . Depression Brother   . Colon cancer Neg Hx       Social History   Socioeconomic History  . Marital status: Divorced    Spouse name: Not on file  . Number of children: Not on file  . Years of education: Not on file  . Highest education level: Not  on file  Social Needs  . Financial resource strain: Not on file  . Food insecurity - worry: Not on file  . Food insecurity - inability: Not on file  . Transportation needs - medical: Not on file  . Transportation needs - non-medical: Not on file  Occupational History  . Not on file  Tobacco Use  . Smoking status: Former Smoker    Types: Cigarettes  . Smokeless tobacco: Never Used  Substance and Sexual Activity  . Alcohol use: No  . Drug use: No  . Sexual activity: No    Comment: refused condoms  Other Topics Concern  . Not on file  Social History Narrative  . Not on file    Allergies  Allergen Reactions  . Naproxen Sodium     REACTION: kidney failure  . Nsaids     REACTION: GI upset  . Other Rash    Black dye     Current Outpatient Medications:  .  DESCOVY 200-25 MG tablet, TAKE 1 TABLET BY MOUTH DAILY, Disp: 30 tablet, Rfl: 3 .  fluocinonide ointment (LIDEX) 0.05 %, APPLY EXTERNALLY TO THE AFFECTED AREA TWICE DAILY, Disp: 30 g, Rfl: 0 .  fluocinonide ointment (LIDEX) 0.05 %, Apply topically 2 (two) times daily., Disp: 90  g, Rfl: 3 .  HYDROcodone-acetaminophen (NORCO) 5-325 MG tablet, Take 1 tablet by mouth every 6 (six) hours as needed. (Patient not taking: Reported on 07/21/2017), Disp: 40 tablet, Rfl: 0 .  INTELENCE 200 MG TABS, TAKE 2 TABLETS(400 MG) BY MOUTH DAILY, Disp: 60 tablet, Rfl: 3 .  ISENTRESS HD 600 MG TABS, TAKE 2 TABLETS BY MOUTH DAILY, Disp: 60 tablet, Rfl: 3 .  metoprolol succinate (TOPROL-XL) 25 MG 24 hr tablet, TAKE 1/2 TABLET BY MOUTH EVERY DAY AS NEEDED FOR BLOOD PRESSURE, Disp: 45 tablet, Rfl: 3 .  omeprazole (PRILOSEC) 40 MG capsule, Take 1 capsule (40 mg total) by mouth daily., Disp: 90 capsule, Rfl: 3 .  pravastatin (PRAVACHOL) 20 MG tablet, Take 1 tablet (20 mg total) by mouth daily., Disp: 90 tablet, Rfl: 3 .  promethazine (PHENERGAN) 25 MG tablet, Take 1 tablet (25 mg total) by mouth every 6 (six) hours as needed for nausea. (Patient not  taking: Reported on 07/21/2017), Disp: 30 tablet, Rfl: 1 .  Raltegravir Potassium (ISENTRESS HD) 600 MG TABS, Take 1,200 mg by mouth daily., Disp: 60 tablet, Rfl: 11 .  traZODone (DESYREL) 50 MG tablet, Take 1 tablet (50 mg total) by mouth at bedtime as needed for sleep., Disp: 90 tablet, Rfl: 3  Review of Systems  Constitutional: Negative for chills and fever.  HENT: Negative for congestion and sore throat.   Eyes: Negative for photophobia.  Respiratory: Negative for cough, shortness of breath and wheezing.   Cardiovascular: Negative for chest pain, palpitations and leg swelling.  Gastrointestinal: Negative for abdominal pain, blood in stool, constipation, diarrhea, nausea and vomiting.  Genitourinary: Negative for dysuria, flank pain and hematuria.  Musculoskeletal: Negative for back pain and myalgias.  Skin: Negative for rash.  Neurological: Negative for dizziness, weakness and headaches.  Hematological: Does not bruise/bleed easily.  Psychiatric/Behavioral: Negative for suicidal ideas.       Objective:   Physical Exam  Constitutional: He is oriented to person, place, and time. He appears well-developed and well-nourished. No distress.  HENT:  Head: Normocephalic and atraumatic.  Mouth/Throat: No oropharyngeal exudate.  Eyes: Conjunctivae and EOM are normal. No scleral icterus.  Neck: Normal range of motion. Neck supple.  Cardiovascular: Normal rate and regular rhythm.  Pulmonary/Chest: Effort normal. No respiratory distress. He has no wheezes.  Abdominal: Soft. He exhibits no distension.  Musculoskeletal: He exhibits no edema or tenderness.  Neurological: He is alert and oriented to person, place, and time. He exhibits normal muscle tone. Coordination normal.  Skin: Skin is warm and dry. No rash noted. He is not diaphoretic. No erythema. No pallor.  Psychiatric: He has a normal mood and affect. His behavior is normal. Judgment and thought content normal.  Nursing note and vitals  reviewed.    Ganglion cyst left wrist:      Assessment & Plan:   HIV: well controlled on  ISENTRESS HD 600mg  TWO PILLS ONCE DAILY with TWO INTELENCE PILLS ONCE daily and one Descovy once daily  We will simplify to [facility], recheck labs in one month and see me in 6 weeks   HTN on beta blocker There were no vitals filed for this visit.   Hyperlipidemia: followed by PCP  I spent greater than 25 minutes with the patient including greater than 50% of time in face to face counsel of the patient re his new ARV regimen, prior regimens and resistance mutations  and in coordination of his  care.

## 2017-12-21 NOTE — Patient Instructions (Signed)
I am going to simplify you to [facility]  Just one pill once daily  I want to have you come back and have labs checked in one month and see me in 6 weeks  The meds HAVE to still come from [facility]

## 2018-01-13 ENCOUNTER — Other Ambulatory Visit: Payer: Medicare Other

## 2018-01-13 DIAGNOSIS — B20 Human immunodeficiency virus [HIV] disease: Secondary | ICD-10-CM

## 2018-01-13 DIAGNOSIS — I10 Essential (primary) hypertension: Secondary | ICD-10-CM

## 2018-01-13 LAB — COMPLETE METABOLIC PANEL WITHOUT GFR
AG Ratio: 1.3 (calc) (ref 1.0–2.5)
ALT: 11 U/L (ref 9–46)
AST: 15 U/L (ref 10–35)
Albumin: 4.4 g/dL (ref 3.6–5.1)
Alkaline phosphatase (APISO): 100 U/L (ref 40–115)
BUN: 16 mg/dL (ref 7–25)
CO2: 24 mmol/L (ref 20–32)
Calcium: 9.8 mg/dL (ref 8.6–10.3)
Chloride: 105 mmol/L (ref 98–110)
Creat: 1.07 mg/dL (ref 0.70–1.25)
GFR, Est African American: 87 mL/min/1.73m2
GFR, Est Non African American: 75 mL/min/1.73m2
Globulin: 3.4 g/dL (ref 1.9–3.7)
Glucose, Bld: 115 mg/dL — ABNORMAL HIGH (ref 65–99)
Potassium: 4.3 mmol/L (ref 3.5–5.3)
Sodium: 137 mmol/L (ref 135–146)
Total Bilirubin: 0.3 mg/dL (ref 0.2–1.2)
Total Protein: 7.8 g/dL (ref 6.1–8.1)

## 2018-01-13 LAB — CBC WITH DIFFERENTIAL/PLATELET
Basophils Absolute: 141 cells/uL (ref 0–200)
Basophils Relative: 1.4 %
Eosinophils Absolute: 434 cells/uL (ref 15–500)
Eosinophils Relative: 4.3 %
HCT: 47.4 % (ref 38.5–50.0)
Hemoglobin: 16.8 g/dL (ref 13.2–17.1)
Lymphs Abs: 2697 cells/uL (ref 850–3900)
MCH: 32.6 pg (ref 27.0–33.0)
MCHC: 35.4 g/dL (ref 32.0–36.0)
MCV: 91.9 fL (ref 80.0–100.0)
MPV: 9.6 fL (ref 7.5–12.5)
Monocytes Relative: 9.9 %
Neutro Abs: 5828 cells/uL (ref 1500–7800)
Neutrophils Relative %: 57.7 %
Platelets: 321 10*3/uL (ref 140–400)
RBC: 5.16 10*6/uL (ref 4.20–5.80)
RDW: 12.9 % (ref 11.0–15.0)
Total Lymphocyte: 26.7 %
WBC mixed population: 1000 cells/uL — ABNORMAL HIGH (ref 200–950)
WBC: 10.1 10*3/uL (ref 3.8–10.8)

## 2018-01-14 LAB — T-HELPER CELL (CD4) - (RCID CLINIC ONLY)
CD4 % Helper T Cell: 24 % — ABNORMAL LOW (ref 33–55)
CD4 T Cell Abs: 730 /uL (ref 400–2700)

## 2018-01-15 LAB — HIV-1 RNA QUANT-NO REFLEX-BLD
HIV 1 RNA Quant: <20 copies/mL
HIV-1 RNA Quant, Log: <1.3 Log copies/mL

## 2018-01-20 ENCOUNTER — Encounter: Payer: Self-pay | Admitting: Infectious Disease

## 2018-01-27 ENCOUNTER — Encounter: Payer: Self-pay | Admitting: Infectious Disease

## 2018-01-27 ENCOUNTER — Ambulatory Visit (INDEPENDENT_AMBULATORY_CARE_PROVIDER_SITE_OTHER): Payer: Medicare Other | Admitting: Infectious Disease

## 2018-01-27 VITALS — BP 131/86 | HR 91 | Temp 97.6°F | Ht 71.0 in | Wt 235.0 lb

## 2018-01-27 DIAGNOSIS — Z79899 Other long term (current) drug therapy: Secondary | ICD-10-CM | POA: Diagnosis not present

## 2018-01-27 DIAGNOSIS — E782 Mixed hyperlipidemia: Secondary | ICD-10-CM

## 2018-01-27 DIAGNOSIS — D729 Disorder of white blood cells, unspecified: Secondary | ICD-10-CM | POA: Diagnosis not present

## 2018-01-27 DIAGNOSIS — Z113 Encounter for screening for infections with a predominantly sexual mode of transmission: Secondary | ICD-10-CM | POA: Diagnosis not present

## 2018-01-27 DIAGNOSIS — B20 Human immunodeficiency virus [HIV] disease: Secondary | ICD-10-CM

## 2018-01-27 HISTORY — DX: Disorder of white blood cells, unspecified: D72.9

## 2018-01-27 NOTE — Progress Notes (Signed)
Chief complaint: "crick in his neck"   Subjective:    Patient ID: Hunter Pratt, male    DOB: December 21, 1955, 62 y.o.   MRN: [id number]  HPI  62  Year old male who is doing superbly well on his antiviral regimen, of intelence, Isentress both QDaily and Descovy once dailyi with undetectable viral load and health cd4 count.     I reviewed [place];'s genotypes and we decided to simplify his regimen at last visit to [facility]  He has done well and maintained perfect virological suppression.   Lab Results  Component Value Date   HIV1RNAQUANT <20 NOT DETECTED 01/13/2018   HIV1RNAQUANT <20 DETECTED (A) 12/07/2017   HIV1RNAQUANT <20 NOT DETECTED 06/10/2017   He noticed a "crick in neck" first few weeks which was not likely related to meds. This has since resolved.  He also noted that labs showed elevation of abnormal WBC but this was barely out of range.  Past Medical History:  Diagnosis Date  . Arthritis   . Constipation 01/28/2017  . DDD (degenerative disc disease), cervical   . Ganglion cyst 01/28/2017  . GERD (gastroesophageal reflux disease)   . Hearing loss    Bilateral  . HIV infection ([place])   . Hyperlipidemia   . Kidney stone   . OA (osteoarthritis) of knee   . PUD (peptic ulcer disease)   . Systolic dysfunction    EF 40-45%    Past Surgical History:  Procedure Laterality Date  . CERVICAL DISCECTOMY  1990 and 1991  . GANGLION CYST EXCISION Left 03/18/2017   Procedure: REMOVAL GANGLION OF LEFT WRIST, TENOLYSIS;  Surgeon: Leandrew Koyanagi, MD;  Location: [facility];  Service: Orthopedics;  Laterality: Left;    Family History  Problem Relation Age of Onset  . Hypertension Mother   . Osteoporosis Mother   . Osteoporosis Father   . Depression Father   . Hearing loss Father   . Osteoporosis Sister   . Arthritis Sister   . Hypertension Brother   . Hyperlipidemia Brother   . Depression Brother   . Colon cancer Neg Hx       Social History    Socioeconomic History  . Marital status: Divorced    Spouse name: None  . Number of children: None  . Years of education: None  . Highest education level: None  Social Needs  . Financial resource strain: None  . Food insecurity - worry: None  . Food insecurity - inability: None  . Transportation needs - medical: None  . Transportation needs - non-medical: None  Occupational History  . None  Tobacco Use  . Smoking status: Former Smoker    Types: Cigarettes  . Smokeless tobacco: Never Used  Substance and Sexual Activity  . Alcohol use: No  . Drug use: No  . Sexual activity: No    Comment: refused condoms  Other Topics Concern  . None  Social History Narrative  . None    Allergies  Allergen Reactions  . Naproxen Sodium     REACTION: kidney failure  . Nsaids     REACTION: GI upset  . Other Rash    Black dye     Current Outpatient Medications:  .  bictegravir-emtricitabine-tenofovir AF (BIKTARVY) 50-200-25 MG TABS tablet, Take 1 tablet by mouth daily., Disp: 30 tablet, Rfl: 11 .  fluocinonide ointment (LIDEX) 0.05 %, APPLY EXTERNALLY TO THE AFFECTED AREA TWICE DAILY, Disp: 30 g, Rfl: 0 .  metoprolol succinate (TOPROL-XL)  25 MG 24 hr tablet, TAKE 1/2 TABLET BY MOUTH EVERY DAY AS NEEDED FOR BLOOD PRESSURE (Patient not taking: Reported on 01/27/2018), Disp: 45 tablet, Rfl: 3 .  omeprazole (PRILOSEC) 40 MG capsule, Take 1 capsule (40 mg total) by mouth daily. (Patient not taking: Reported on 01/27/2018), Disp: 90 capsule, Rfl: 3 .  pravastatin (PRAVACHOL) 20 MG tablet, Take 1 tablet (20 mg total) by mouth daily. (Patient not taking: Reported on 01/27/2018), Disp: 90 tablet, Rfl: 3 .  promethazine (PHENERGAN) 25 MG tablet, Take 1 tablet (25 mg total) by mouth every 6 (six) hours as needed for nausea. (Patient not taking: Reported on 12/21/2017), Disp: 30 tablet, Rfl: 1 .  traZODone (DESYREL) 50 MG tablet, Take 1 tablet (50 mg total) by mouth at bedtime as needed for sleep.  (Patient not taking: Reported on 01/27/2018), Disp: 90 tablet, Rfl: 3  Review of Systems  Constitutional: Negative for chills and fever.  HENT: Negative for congestion and sore throat.   Eyes: Negative for photophobia.  Respiratory: Negative for cough, shortness of breath and wheezing.   Cardiovascular: Negative for chest pain, palpitations and leg swelling.  Gastrointestinal: Negative for abdominal pain, blood in stool, constipation, diarrhea, nausea and vomiting.  Genitourinary: Negative for dysuria, flank pain and hematuria.  Musculoskeletal: Positive for myalgias. Negative for back pain.  Skin: Negative for rash.  Neurological: Negative for dizziness, weakness and headaches.  Hematological: Does not bruise/bleed easily.  Psychiatric/Behavioral: Negative for suicidal ideas.       Objective:   Physical Exam  Constitutional: He is oriented to person, place, and time. He appears well-developed and well-nourished. No distress.  HENT:  Head: Normocephalic and atraumatic.  Mouth/Throat: No oropharyngeal exudate.  Eyes: Conjunctivae and EOM are normal. No scleral icterus.  Neck: Normal range of motion. Neck supple.  Cardiovascular: Normal rate and regular rhythm.  Pulmonary/Chest: Effort normal. No respiratory distress. He has no wheezes.  Abdominal: Soft. He exhibits no distension.  Musculoskeletal: He exhibits no edema or tenderness.  Neurological: He is alert and oriented to person, place, and time. He exhibits normal muscle tone. Coordination normal.  Skin: Skin is warm and dry. No rash noted. He is not diaphoretic. No erythema. No pallor.  Psychiatric: He has a normal mood and affect. His behavior is normal. Judgment and thought content normal.  Nursing note and vitals reviewed.    Ganglion cyst left wrist:      Assessment & Plan:   HIV: continue  BIKTARVY, recheck labs in July when time to renew SPAP  HTN  Has been on beta blocker Vitals:   01/27/18 0933  BP: 131/86   Pulse: 91  Temp: 97.6 F (36.4 C)     Hyperlipidemia: followed by PCP  Abnormal WBC population: offered to recheck  CBC w diff today but he wants to have it checked when he sees PCP next month.  Neck ans shoulder pain: resolved and NOT likely related to meds

## 2018-02-01 ENCOUNTER — Ambulatory Visit (INDEPENDENT_AMBULATORY_CARE_PROVIDER_SITE_OTHER): Payer: Medicare HMO | Admitting: Family Medicine

## 2018-02-01 ENCOUNTER — Encounter: Payer: Self-pay | Admitting: Family Medicine

## 2018-02-01 ENCOUNTER — Other Ambulatory Visit: Payer: Self-pay

## 2018-02-01 VITALS — BP 122/68 | HR 90 | Temp 98.3°F | Resp 16 | Ht 71.0 in | Wt 233.0 lb

## 2018-02-01 DIAGNOSIS — G609 Hereditary and idiopathic neuropathy, unspecified: Secondary | ICD-10-CM | POA: Diagnosis not present

## 2018-02-01 DIAGNOSIS — K219 Gastro-esophageal reflux disease without esophagitis: Secondary | ICD-10-CM

## 2018-02-01 DIAGNOSIS — E119 Type 2 diabetes mellitus without complications: Secondary | ICD-10-CM

## 2018-02-01 DIAGNOSIS — B20 Human immunodeficiency virus [HIV] disease: Secondary | ICD-10-CM | POA: Diagnosis not present

## 2018-02-01 DIAGNOSIS — G4700 Insomnia, unspecified: Secondary | ICD-10-CM | POA: Diagnosis not present

## 2018-02-01 DIAGNOSIS — R69 Illness, unspecified: Secondary | ICD-10-CM | POA: Diagnosis not present

## 2018-02-01 DIAGNOSIS — E782 Mixed hyperlipidemia: Secondary | ICD-10-CM

## 2018-02-01 DIAGNOSIS — I10 Essential (primary) hypertension: Secondary | ICD-10-CM

## 2018-02-01 MED ORDER — FLUOCINONIDE 0.05 % EX OINT: TOPICAL_OINTMENT | CUTANEOUS | 0 refills | Status: DC

## 2018-02-01 MED ORDER — TRAZODONE HCL 50 MG PO TABS: 50.0000 mg | ORAL_TABLET | Freq: Every evening | ORAL | 3 refills | Status: DC | PRN

## 2018-02-01 MED ORDER — OMEPRAZOLE 20 MG PO CPDR: 20.0000 mg | DELAYED_RELEASE_CAPSULE | Freq: Every day | ORAL | 3 refills | Status: DC

## 2018-02-01 NOTE — Progress Notes (Signed)
   Subjective:    Patient ID: Hunter Pratt, male    DOB: 1956-09-26, 62 y.o.   MRN: [id number]  Patient presents for Follow-up  Pt here to f/u chronic medical problems. Last seen in Jan 2018   He does continue to follow witth ID for his HIV.He is on a new medication Biktarvy. He is at risk for Hepatitis B reocurrence and liver inflammation, WBC has been a little elevated. But told his HIV is undectable   HTN- BP have been 130-140/8-90 at ID clinic , was on metopolol    DM- fasting glucoses has been 107-140, last A1C 5.5% 1 year ago , needs new meter   Hyperlipidemia- no longer on cholesterol medication, lipids done in Jan TG 138/ LDL 78  GERD- taking omeprazole 20mg  daily prn   He continues to help take care of his mother which is stressful at times.  He is not sleeping well without the trazodone.  Sometimes even finds himself having anxiety attack moment especially when he gets frustrated or upset.  Felt like he did better with the trazodone.    Also sen by Dr. Erlinda Hong for removal of his ganglion cyst off his wrist     Review Of Systems:  GEN- denies fatigue, fever, weight loss,weakness, recent illness HEENT- denies eye drainage, change in vision, nasal discharge, CVS- denies chest pain, palpitations RESP- denies SOB, cough, wheeze ABD- denies N/V, change in stools, abd pain GU- denies dysuria, hematuria, dribbling, incontinence MSK- denies joint pain, muscle aches, injury Neuro- denies headache, dizziness, syncope, seizure activity       Objective:    BP 122/68   Pulse 90   Temp 98.3 F (36.8 C) (Oral)   Resp 16   Ht 5\' 11"  (1.803 m)   Wt 233 lb (105.7 kg)   SpO2 97%   BMI 32.50 kg/m  GEN- NAD, alert and oriented x3 HEENT- PERRL, EOMI, non injected sclera, pink conjunctiva, MMM, oropharynx clear Neck- Supple, no thyromegaly CVS- RRR, no murmur RESP-CTAB ABD-NABS,soft,NT,ND Psych- Normal affect and mood EXT- No edema Pulses- Radial, DP- 2+        Assessment &  Plan:      Problem List Items Addressed This Visit      Unprioritized   Hyperlipidemia   Insomnia - Primary    Restart trazodone       Human immunodeficiency virus (HIV) disease ([place])    HIV treatment per ID, reviewed labs at bedside      HTN (hypertension)    BP looks okay today, will continue to monitor off the metoprolol      Hereditary and idiopathic peripheral neuropathy    Currently stable with symptoms, more intermittant       Relevant Medications   traZODone (DESYREL) 50 MG tablet   GERD    Low dose PPI, asymptomatic       Relevant Medications   omeprazole (PRILOSEC) 20 MG capsule   Diabetes type 2, controlled ([place])    DM has been diet controlled, recheck A1C Urine micro, mild renal insuffiency       Relevant Orders   Hemoglobin A1c   HM DIABETES FOOT EXAM (Completed)   Microalbumin / creatinine urine ratio      Note: This dictation was prepared with Dragon dictation along with smaller phrase technology. Any transcriptional errors that result from this process are unintentional.

## 2018-02-01 NOTE — Patient Instructions (Addendum)
F/U schedule a physical  Call and figure out where to send the new meter  We will call with  Lab results  Restart the trazodone for sleep

## 2018-02-01 NOTE — Assessment & Plan Note (Signed)
HIV treatment per ID, reviewed labs at bedside

## 2018-02-01 NOTE — Assessment & Plan Note (Signed)
Restart trazodone

## 2018-02-01 NOTE — Assessment & Plan Note (Signed)
Currently stable with symptoms, more intermittant

## 2018-02-01 NOTE — Assessment & Plan Note (Signed)
Low dose PPI, asymptomatic

## 2018-02-01 NOTE — Assessment & Plan Note (Signed)
DM has been diet controlled, recheck A1C Urine micro, mild renal insuffiency

## 2018-02-01 NOTE — Assessment & Plan Note (Signed)
BP looks okay today, will continue to monitor off the metoprolol

## 2018-02-02 LAB — MICROALBUMIN / CREATININE URINE RATIO
Creatinine, Urine: 156 mg/dL (ref 20–320)
Microalb Creat Ratio: 17 mcg/mg creat (ref <30)
Microalb, Ur: 2.7 mg/dL

## 2018-02-02 LAB — HEMOGLOBIN A1C
Hgb A1c MFr Bld: 6.1 % of total Hgb — ABNORMAL HIGH (ref <5.7)
Mean Plasma Glucose: 128 (calc)
eAG (mmol/L): 7.1 (calc)

## 2018-02-04 ENCOUNTER — Encounter: Payer: Self-pay | Admitting: *Deleted

## 2018-04-22 ENCOUNTER — Encounter: Payer: Self-pay | Admitting: Gastroenterology

## 2018-05-11 ENCOUNTER — Ambulatory Visit (INDEPENDENT_AMBULATORY_CARE_PROVIDER_SITE_OTHER): Payer: Medicare HMO | Admitting: Family Medicine

## 2018-05-11 ENCOUNTER — Other Ambulatory Visit: Payer: Self-pay | Admitting: *Deleted

## 2018-05-11 ENCOUNTER — Encounter: Payer: Self-pay | Admitting: Family Medicine

## 2018-05-11 ENCOUNTER — Other Ambulatory Visit: Payer: Self-pay

## 2018-05-11 VITALS — BP 122/62 | HR 86 | Temp 98.2°F | Resp 16 | Ht 71.0 in | Wt 226.0 lb

## 2018-05-11 DIAGNOSIS — Z1211 Encounter for screening for malignant neoplasm of colon: Secondary | ICD-10-CM

## 2018-05-11 DIAGNOSIS — Z0000 Encounter for general adult medical examination without abnormal findings: Secondary | ICD-10-CM

## 2018-05-11 DIAGNOSIS — E119 Type 2 diabetes mellitus without complications: Secondary | ICD-10-CM

## 2018-05-11 DIAGNOSIS — K219 Gastro-esophageal reflux disease without esophagitis: Secondary | ICD-10-CM | POA: Diagnosis not present

## 2018-05-11 DIAGNOSIS — E782 Mixed hyperlipidemia: Secondary | ICD-10-CM

## 2018-05-11 DIAGNOSIS — M2550 Pain in unspecified joint: Secondary | ICD-10-CM | POA: Diagnosis not present

## 2018-05-11 DIAGNOSIS — I10 Essential (primary) hypertension: Secondary | ICD-10-CM | POA: Diagnosis not present

## 2018-05-11 DIAGNOSIS — Z125 Encounter for screening for malignant neoplasm of prostate: Secondary | ICD-10-CM | POA: Diagnosis not present

## 2018-05-11 MED ORDER — OMEPRAZOLE 20 MG PO CPDR: 20.0000 mg | DELAYED_RELEASE_CAPSULE | Freq: Every day | ORAL | 3 refills | Status: DC

## 2018-05-11 MED ORDER — TETANUS-DIPHTH-ACELL PERTUSSIS 5-2.5-18.5 LF-MCG/0.5 IM SUSP: 0.5000 mL | Freq: Once | INTRAMUSCULAR | 0 refills | Status: AC

## 2018-05-11 MED ORDER — ZOSTER VAC RECOMB ADJUVANTED 50 MCG/0.5ML IM SUSR: 0.5000 mL | Freq: Once | INTRAMUSCULAR | 0 refills | Status: AC

## 2018-05-11 NOTE — Telephone Encounter (Signed)
-----   Message from Alycia Rossetti, MD sent at 05/11/2018  3:07 PM EDT ----- Regarding: FW: Shingrix  Can you send shingrix to pharmacy, let pt know both TDAP and shingrix are there. Dr Tommy Medal said safe to take.    ----- Message ----- From: Tommy Medal, Lavell Islam, MD Sent: 05/11/2018   3:00 PM To: Alycia Rossetti, MD Subject: RE: Shingrix                                   Shingles vaccine fine to give to ppl like him thave have healhty CD4 count thx! ----- Message ----- From: Alycia Rossetti, MD Sent: 05/11/2018   1:28 PM To: Truman Hayward, MD Subject: Shingrix                                         Do you have any concerns with giving Mr. Salehi Shingrix since it is not a live vaccine?    I was not sure with is anti-viral if this would be an issue.    Also I will CC you on his bloodwork from today's physical.   [facility]

## 2018-05-11 NOTE — Patient Instructions (Addendum)
Use tylenol or topical rub Take prilosec once a day for 2-3 weeks  Continue other medications We will call with lab results   Referral to GI for colonoscopy Dr. Ardis Hughs  F/U 6 months

## 2018-05-11 NOTE — Progress Notes (Signed)
Subjective:   Patient presents for Medicare Annual/Subsequent preventive examination.   GERD- increased acid reflux symptoms and irritating stomach for past 2 weeks, states started after taking some ibuprofen. Later that evening had GI upset, no vomiting, but mild nausea, no change in bowels, mouth filled with saliva. Still gets some epigastric discomfort, but no pain with eating, took a prilosec the other day, has not been taking regulary   DM- dit controlled, last A1C 6.1%, LDL was 78 in Jan   Due for PSA for prostate cancer screening    HIV- followed by ID, has had elevated WBC, due for rpeat    Joint pain- gets joint pain, hands, knees, ankles, no swelling but pain mostly at night, has known OA. Takes tylenol  Review Past Medical/Family/Social: per EMR    Risk Factors  Current exercise habits: walks  Dietary issues discussed: yes  Cardiac risk factors: Hyperlipidemia   Depression Screen  (Note: if answer to either of the following is "Yes", a more complete depression screening is indicated)  Over the past two weeks, have you felt down, depressed or hopeless? No Over the past two weeks, have you felt little interest or pleasure in doing things? No Have you lost interest or pleasure in daily life? No Do you often feel hopeless? No Do you cry easily over simple problems? No   Activities of Daily Living  In your present state of health, do you have any difficulty performing the following activities?:  Driving? No  Managing money? No  Feeding yourself? No  Getting from bed to chair? No  Climbing a flight of stairs? No  Preparing food and eating?: No  Bathing or showering? No  Getting dressed: No  Getting to the toilet? No  Using the toilet:No  Moving around from place to place: No  In the past year have you fallen or had a near fall?:No  Are you sexually active? No  Do you have more than one partner? No   Hearing Difficulties: YES- Wears hearing aides  Do you often ask  people to speak up or repeat themselves? Yes  Do you experience ringing or noises in your ears? Yes Do you have difficulty understanding soft or whispered voices? Yes Do you feel that you have a problem with memory? No Do you often misplace items? No  Do you feel safe at home? Yes  Cognitive Testing  Alert? Yes Normal Appearance?Yes  Oriented to person? Yes Place? Yes  Time? Yes  Recall of three objects? Yes  Can perform simple calculations? Yes  Displays appropriate judgment?Yes  Can read the correct time from a watch face?Yes   List the Names of Other Physician/Practitioners you currently use:   ID- Dr. Tommy Medal  , orthopedics as needed   Screening Tests / Date Colonoscopy     Due per pt received letter             Shingrix - Due  Pneumonia- UTD Influenza Vaccine UTD Tetanus/tdap Due   ROS: GEN- denies fatigue, fever, weight loss,weakness, recent illness HEENT- denies eye drainage, change in vision, nasal discharge, CVS- denies chest pain, palpitations RESP- denies SOB, cough, wheeze ABD- denies N/V, change in stools, abd pain GU- denies dysuria, hematuria, dribbling, incontinence MSK- denies joint pain, muscle aches, injury Neuro- denies headache, dizziness, syncope, seizure activity  Physical: vitals reviewed  GEN- NAD, alert and oriented x3 HEENT- PERRL, EOMI, non injected sclera, pink conjunctiva, MMM, oropharynx clear, TM clear , canals clear, hearing aides bila  Neck- Supple, no thryomegaly, no bruit  CVS- RRR, no murmur RESP-CTAB ABD-NABS,soft, NT, ND  MSK- Fair ROM upper and lowr ext, no joint effusions  EXT- No edema Pulses- Radial, DP- 2+    Assessment:    Annual wellness medicare exam   Plan:    During the course of the visit the patient was educated and counseled about appropriate screening and preventive services including:  TDAP sent to pharmacy  PSA for prostate cancer screening  Colorectal cancer screening - referral to GI Shingles vaccine.  - I sent a message to his Infectious disease doctor, since Shingrix is not live vaccine he should be able to get this, unless there is concern with the anti-viral   Screen Neg for depression.   DM- diet controlled, recent A1C at goal  GERD- restart prilosec daily see how symptoms improve, if not may need EGD with colonoscopy   Joint pain- likely OA, will check RF, can use tylenol and topical rubs for joints    Diet review for nutrition referral? Yes ____ Not Indicated __x__  Patient Instructions (the written plan) was given to the patient.  Medicare Attestation  I have personally reviewed:  The patient's medical and social history  Their use of alcohol, tobacco or illicit drugs  Their current medications and supplements  The patient's functional ability including ADLs,fall risks, home safety risks, cognitive, and hearing and visual impairment  Diet and physical activities  Evidence for depression or mood disorders  The patient's weight, height, BMI, and visual acuity have been recorded in the chart. I have made referrals, counseling, and provided education to the patient based on review of the above and I have provided the patient with a written personalized care plan for preventive services.

## 2018-05-11 NOTE — Telephone Encounter (Signed)
Prescription sent to pharmacy. .   Call placed to patient and patient made aware.  

## 2018-05-12 LAB — COMPREHENSIVE METABOLIC PANEL
AG Ratio: 1.3 (calc) (ref 1.0–2.5)
ALT: 12 U/L (ref 9–46)
AST: 15 U/L (ref 10–35)
Albumin: 4.2 g/dL (ref 3.6–5.1)
Alkaline phosphatase (APISO): 121 U/L — ABNORMAL HIGH (ref 40–115)
BUN: 12 mg/dL (ref 7–25)
CO2: 26 mmol/L (ref 20–32)
Calcium: 9.6 mg/dL (ref 8.6–10.3)
Chloride: 101 mmol/L (ref 98–110)
Creat: 1.11 mg/dL (ref 0.70–1.25)
Globulin: 3.3 g/dL (calc) (ref 1.9–3.7)
Glucose, Bld: 98 mg/dL (ref 65–99)
Potassium: 4.3 mmol/L (ref 3.5–5.3)
Sodium: 137 mmol/L (ref 135–146)
Total Bilirubin: 0.4 mg/dL (ref 0.2–1.2)
Total Protein: 7.5 g/dL (ref 6.1–8.1)

## 2018-05-12 LAB — CBC WITH DIFFERENTIAL/PLATELET
Basophils Absolute: 112 cells/uL (ref 0–200)
Basophils Relative: 1.2 %
Eosinophils Absolute: 465 cells/uL (ref 15–500)
Eosinophils Relative: 5 %
HCT: 50.7 % — ABNORMAL HIGH (ref 38.5–50.0)
Hemoglobin: 17.8 g/dL — ABNORMAL HIGH (ref 13.2–17.1)
Lymphs Abs: 2176 cells/uL (ref 850–3900)
MCH: 32.4 pg (ref 27.0–33.0)
MCHC: 35.1 g/dL (ref 32.0–36.0)
MCV: 92.2 fL (ref 80.0–100.0)
MPV: 9.7 fL (ref 7.5–12.5)
Monocytes Relative: 11.2 %
Neutro Abs: 5506 cells/uL (ref 1500–7800)
Neutrophils Relative %: 59.2 %
Platelets: 334 10*3/uL (ref 140–400)
RBC: 5.5 10*6/uL (ref 4.20–5.80)
RDW: 12.9 % (ref 11.0–15.0)
Total Lymphocyte: 23.4 %
WBC mixed population: 1042 cells/uL — ABNORMAL HIGH (ref 200–950)
WBC: 9.3 10*3/uL (ref 3.8–10.8)

## 2018-05-12 LAB — RHEUMATOID FACTOR: Rhuematoid fact SerPl-aCnc: <14 IU/mL (ref <14)

## 2018-05-12 LAB — PSA: PSA: 0.2 ng/mL (ref <=4.0)

## 2018-05-13 ENCOUNTER — Encounter: Payer: Self-pay | Admitting: *Deleted

## 2018-06-02 ENCOUNTER — Encounter: Payer: Self-pay | Admitting: Gastroenterology

## 2018-06-07 ENCOUNTER — Encounter: Payer: Self-pay | Admitting: Physician Assistant

## 2018-06-07 ENCOUNTER — Other Ambulatory Visit: Payer: Self-pay

## 2018-06-07 ENCOUNTER — Ambulatory Visit (INDEPENDENT_AMBULATORY_CARE_PROVIDER_SITE_OTHER): Payer: Medicare HMO | Admitting: Physician Assistant

## 2018-06-07 VITALS — BP 118/74 | HR 88 | Temp 98.2°F | Resp 16 | Ht 71.0 in | Wt 223.8 lb

## 2018-06-07 DIAGNOSIS — B20 Human immunodeficiency virus [HIV] disease: Secondary | ICD-10-CM | POA: Diagnosis not present

## 2018-06-07 DIAGNOSIS — L03119 Cellulitis of unspecified part of limb: Secondary | ICD-10-CM

## 2018-06-07 DIAGNOSIS — L02419 Cutaneous abscess of limb, unspecified: Secondary | ICD-10-CM

## 2018-06-07 DIAGNOSIS — R69 Illness, unspecified: Secondary | ICD-10-CM | POA: Diagnosis not present

## 2018-06-07 MED ORDER — SULFAMETHOXAZOLE-TRIMETHOPRIM 800-160 MG PO TABS
1.0000 | ORAL_TABLET | Freq: Two times a day (BID) | ORAL | 0 refills | Status: DC
Start: 2018-06-07 — End: 2018-06-29

## 2018-06-07 MED ORDER — DOXYCYCLINE HYCLATE 100 MG PO TABS: 100.0000 mg | ORAL_TABLET | Freq: Two times a day (BID) | ORAL | 0 refills | Status: DC

## 2018-06-07 NOTE — Progress Notes (Signed)
Patient ID: Hunter Pratt MRN: [id number], DOB: 12-10-1955, 62 y.o. Date of Encounter: 06/07/2018, 3:40 PM    Chief Complaint:  Chief Complaint  Patient presents with  . cyst on back of leg    symptoms for 2-3 weeks      HPI: 62 y.o. year old male presents with above.  He reports that he gets these "cysts " from time to time and they usually will just go away on their own.  He started noticing this one about a month ago and that it worsened about 8 days ago.  States that it has stayed about the same for the past 8 days.  Does not think that it has burst or drained.  Decided he should come in and get it evaluated. Has had no fevers or chills. Notes that he does have HIV but virus load is undetectable. Has no other specific concerns to address today.     Home Meds:   Outpatient Medications Prior to Visit  Medication Sig Dispense Refill  . bictegravir-emtricitabine-tenofovir AF (BIKTARVY) 50-200-25 MG TABS tablet Take 1 tablet by mouth daily. 30 tablet 11  . fluocinonide ointment (LIDEX) 0.05 % APPLY EXTERNALLY TO THE AFFECTED AREA TWICE DAILY 30 g 0  . omeprazole (PRILOSEC) 20 MG capsule Take 1 capsule (20 mg total) by mouth daily. 30 capsule 3  . traZODone (DESYREL) 50 MG tablet Take 1 tablet (50 mg total) by mouth at bedtime as needed for sleep. 90 tablet 3   No facility-administered medications prior to visit.     Allergies:  Allergies  Allergen Reactions  . Naproxen Sodium     REACTION: kidney failure  . Nsaids     REACTION: GI upset  . Other Rash    Black dye      Review of Systems: See HPI for pertinent ROS. All other ROS negative.    Physical Exam: Blood pressure 118/74, pulse 88, temperature 98.2 F (36.8 C), temperature source Oral, resp. rate 16, height 5\' 11"  (1.803 m), weight 101.5 kg (223 lb 12.8 oz), SpO2 96 %., Body mass index is 31.21 kg/m. General: WNWD WM.  Appears in no acute distress. Neck: Supple. No thyromegaly. No lymphadenopathy. Lungs:  Clear bilaterally to auscultation without wheezes, rales, or rhonchi. Breathing is unlabored. Heart: Regular rhythm. No murmurs, rubs, or gallops. Msk:  Strength and tone normal for age. Extremities/Skin:  Posterior aspect of right thigh---- just inferior to gluteus/buttock--- There is ~ 1 inch diameter area of diffuse mild erythema---- There is ~ 1cm area of firmness--- non-fluctuant.  Even with palpation, there is no drainage from site.  Neuro: Alert and oriented X 3. Moves all extremities spontaneously. Gait is normal. CNII-XII grossly in tact. Psych:  Responds to questions appropriately with a normal affect.     ASSESSMENT AND PLAN:  62 y.o. year old male with  1. Cellulitis and abscess of leg 2. Human immunodeficiency virus (HIV) disease ([place]) Given concomitant HIV / immune deficiency, will treat with 2 abx--- both Bactrim and Doxy.  He is to start both antibiotics immediately and take as directed.   He is to monitor the site.   If site worsens then follow-up immediately.   Otherwise will also follow-up if does not resolve upon completion of antibiotics. - sulfamethoxazole-trimethoprim (BACTRIM DS,SEPTRA DS) 800-160 MG tablet; Take 1 tablet by mouth 2 (two) times daily.  Dispense: 20 tablet; Refill: 0 - doxycycline (VIBRA-TABS) 100 MG tablet; Take 1 tablet (100 mg total) by mouth 2 (two)  times daily.  Dispense: 20 tablet; Refill: 0  2. Human immunodeficiency virus (HIV) disease [facility])    Signed, [address] [place], [place], [facility] 06/07/2018 3:40 PM

## 2018-06-14 ENCOUNTER — Ambulatory Visit: Payer: Medicare HMO

## 2018-06-14 ENCOUNTER — Other Ambulatory Visit: Payer: Medicare HMO

## 2018-06-14 ENCOUNTER — Other Ambulatory Visit (HOSPITAL_COMMUNITY)
Admission: RE | Admit: 2018-06-14 | Discharge: 2018-06-14 | Disposition: A | Discharge status: Home / Self Care | Payer: Medicare HMO | Source: Ambulatory Visit | Attending: Infectious Disease | Admitting: Infectious Disease

## 2018-06-14 DIAGNOSIS — B20 Human immunodeficiency virus [HIV] disease: Secondary | ICD-10-CM | POA: Diagnosis not present

## 2018-06-14 DIAGNOSIS — Z113 Encounter for screening for infections with a predominantly sexual mode of transmission: Secondary | ICD-10-CM | POA: Insufficient documentation

## 2018-06-14 DIAGNOSIS — Z79899 Other long term (current) drug therapy: Secondary | ICD-10-CM

## 2018-06-14 DIAGNOSIS — R69 Illness, unspecified: Secondary | ICD-10-CM | POA: Diagnosis not present

## 2018-06-15 LAB — COMPLETE METABOLIC PANEL WITH GFR
AG Ratio: 1.1 (calc) (ref 1.0–2.5)
ALT: 10 U/L (ref 9–46)
AST: 14 U/L (ref 10–35)
Albumin: 4.3 g/dL (ref 3.6–5.1)
Alkaline phosphatase (APISO): 122 U/L — ABNORMAL HIGH (ref 40–115)
BUN/Creatinine Ratio: 11 (calc) (ref 6–22)
BUN: 14 mg/dL (ref 7–25)
CO2: 22 mmol/L (ref 20–32)
Calcium: 10 mg/dL (ref 8.6–10.3)
Chloride: 102 mmol/L (ref 98–110)
Creat: 1.31 mg/dL — ABNORMAL HIGH (ref 0.70–1.25)
GFR, Est African American: 68 mL/min/{1.73_m2} (ref >=60)
GFR, Est Non African American: 59 mL/min/{1.73_m2} — ABNORMAL LOW (ref >=60)
Globulin: 3.9 g/dL (calc) — ABNORMAL HIGH (ref 1.9–3.7)
Glucose, Bld: 110 mg/dL — ABNORMAL HIGH (ref 65–99)
Potassium: 4.6 mmol/L (ref 3.5–5.3)
Sodium: 134 mmol/L — ABNORMAL LOW (ref 135–146)
Total Bilirubin: 0.4 mg/dL (ref 0.2–1.2)
Total Protein: 8.2 g/dL — ABNORMAL HIGH (ref 6.1–8.1)

## 2018-06-15 LAB — T-HELPER CELL (CD4) - (RCID CLINIC ONLY)
CD4 % Helper T Cell: 22 % — ABNORMAL LOW (ref 33–55)
CD4 T Cell Abs: 570 /uL (ref 400–2700)

## 2018-06-15 LAB — LIPID PANEL
Cholesterol: 165 mg/dL (ref <200)
HDL: 38 mg/dL — ABNORMAL LOW (ref >40)
LDL Cholesterol (Calc): 99 mg/dL (calc)
Non-HDL Cholesterol (Calc): 127 mg/dL (calc) (ref <130)
Total CHOL/HDL Ratio: 4.3 (calc) (ref <5.0)
Triglycerides: 179 mg/dL — ABNORMAL HIGH (ref <150)

## 2018-06-15 LAB — RPR: RPR Ser Ql: NONREACTIVE

## 2018-06-15 LAB — CBC WITH DIFFERENTIAL/PLATELET
Basophils Absolute: 175 cells/uL (ref 0–200)
Basophils Relative: 1.9 %
Eosinophils Absolute: 432 cells/uL (ref 15–500)
Eosinophils Relative: 4.7 %
HCT: 53.2 % — ABNORMAL HIGH (ref 38.5–50.0)
Hemoglobin: 18.3 g/dL — ABNORMAL HIGH (ref 13.2–17.1)
Lymphs Abs: 2558 cells/uL (ref 850–3900)
MCH: 32 pg (ref 27.0–33.0)
MCHC: 34.4 g/dL (ref 32.0–36.0)
MCV: 93.2 fL (ref 80.0–100.0)
MPV: 9.4 fL (ref 7.5–12.5)
Monocytes Relative: 8.7 %
Neutro Abs: 5235 cells/uL (ref 1500–7800)
Neutrophils Relative %: 56.9 %
Platelets: 353 10*3/uL (ref 140–400)
RBC: 5.71 10*6/uL (ref 4.20–5.80)
RDW: 12.9 % (ref 11.0–15.0)
Total Lymphocyte: 27.8 %
WBC mixed population: 800 cells/uL (ref 200–950)
WBC: 9.2 10*3/uL (ref 3.8–10.8)

## 2018-06-15 LAB — URINE CYTOLOGY ANCILLARY ONLY
Chlamydia: NEGATIVE
Neisseria Gonorrhea: NEGATIVE

## 2018-06-15 LAB — MICROALBUMIN / CREATININE URINE RATIO
Creatinine, Urine: 110 mg/dL (ref 20–320)
Microalb Creat Ratio: 16 mcg/mg creat (ref <30)
Microalb, Ur: 1.8 mg/dL

## 2018-06-16 LAB — HIV-1 RNA QUANT-NO REFLEX-BLD
HIV 1 RNA Quant: <20 copies/mL
HIV-1 RNA Quant, Log: <1.3 Log copies/mL

## 2018-06-17 ENCOUNTER — Encounter: Payer: Self-pay | Admitting: Infectious Disease

## 2018-06-29 ENCOUNTER — Ambulatory Visit: Payer: Medicare HMO | Admitting: Family

## 2018-06-29 ENCOUNTER — Encounter: Payer: Self-pay | Admitting: Family

## 2018-06-29 VITALS — BP 158/103 | HR 80 | Temp 97.6°F | Ht 71.0 in | Wt 224.0 lb

## 2018-06-29 DIAGNOSIS — N644 Mastodynia: Secondary | ICD-10-CM

## 2018-06-29 DIAGNOSIS — B20 Human immunodeficiency virus [HIV] disease: Secondary | ICD-10-CM

## 2018-06-29 DIAGNOSIS — Z23 Encounter for immunization: Secondary | ICD-10-CM | POA: Diagnosis not present

## 2018-06-29 DIAGNOSIS — R591 Generalized enlarged lymph nodes: Secondary | ICD-10-CM

## 2018-06-29 DIAGNOSIS — R69 Illness, unspecified: Secondary | ICD-10-CM | POA: Diagnosis not present

## 2018-06-29 NOTE — Progress Notes (Signed)
Subjective:    Patient ID: Hunter Pratt, male    DOB: 28-Jan-1956, 62 y.o.   MRN: [id number]  Chief Complaint  Patient presents with  . HIV Positive/AIDS  . Breast Pain  . Lymph node swelling    HPI:  Hunter Pratt is a 62 y.o. male who presents today for routine follow up of his HIV disease.  1) HIV - Mr. Hunter Pratt was last seen in the office on 01/27/2018 by Dr. Drucilla Schmidt for routine follow-up of HIV disease.  He was noted to be well controlled with virological suppression and a stable CD4 count.  He was continued on Biktarvy at the time.   Mr. Hunter Pratt reports taking his medications as prescribed and denies adverse side effects.  He has missed no dosages and has no problems obtaining or taking his medications.  He continues to have [employer] and receives his medications from [facility] through delivery. Denies fevers, chills, night sweats, headaches, changes in vision, neck pain/stiffness, nausea, diarrhea, vomiting, lesions or rashes.  2.) Mastalgia - This a new problem associated symptom of chest tenderness has been going on for a couple weeks. No injury or trauma. Has noted a small little knot when he lies down and pushes in a certain way. No modifying factors or attempted treatments. Course of the symptoms is staying about the same. Denies changes in skin, texture, or nipple discharge.   3.) Lymphadenopathy - He has noted acute onset lymphadenopathy on the right side of his neck that has been going on for 4 days. Since initial onset the size of his lymph node has improved. There are no modifying factors or attempted treatments. Sometimes eating makes it worse. No fevers, chills or sweats recently. Was being treated Right side of neck has been going on for about 4 days. Thinks it has improved in size since he first noted it. Chewing makes it worse. No fevers, chills or sweats recently.     Allergies  Allergen Reactions  . Naproxen Sodium     REACTION: kidney failure  .  Nsaids     REACTION: GI upset  . Other Rash    Black dye      Outpatient Medications Prior to Visit  Medication Sig Dispense Refill  . bictegravir-emtricitabine-tenofovir AF (BIKTARVY) 50-200-25 MG TABS tablet Take 1 tablet by mouth daily. 30 tablet 11  . fluocinonide ointment (LIDEX) 0.05 % APPLY EXTERNALLY TO THE AFFECTED AREA TWICE DAILY 30 g 0  . omeprazole (PRILOSEC) 20 MG capsule Take 1 capsule (20 mg total) by mouth daily. 30 capsule 3  . traZODone (DESYREL) 50 MG tablet Take 1 tablet (50 mg total) by mouth at bedtime as needed for sleep. 90 tablet 3  . doxycycline (VIBRA-TABS) 100 MG tablet Take 1 tablet (100 mg total) by mouth 2 (two) times daily. (Patient not taking: Reported on 06/29/2018) 20 tablet 0  . sulfamethoxazole-trimethoprim (BACTRIM DS,SEPTRA DS) 800-160 MG tablet Take 1 tablet by mouth 2 (two) times daily. (Patient not taking: Reported on 06/29/2018) 20 tablet 0   No facility-administered medications prior to visit.      Past Medical History:  Diagnosis Date  . Abnormal white blood cell 01/27/2018  . Arthritis   . Constipation 01/28/2017  . DDD (degenerative disc disease), cervical   . Ganglion cyst 01/28/2017  . GERD (gastroesophageal reflux disease)   . Hearing loss    Bilateral  . HIV infection ([place])   . Hyperlipidemia   . Kidney stone   .  OA (osteoarthritis) of knee   . PUD (peptic ulcer disease)   . Systolic dysfunction    EF 40-45%     Past Surgical History:  Procedure Laterality Date  . CERVICAL DISCECTOMY  1990 and 1991  . GANGLION CYST EXCISION Left 03/18/2017   Procedure: REMOVAL GANGLION OF LEFT WRIST, TENOLYSIS;  Surgeon: Leandrew Koyanagi, MD;  Location: [facility];  Service: Orthopedics;  Laterality: Left;       Review of Systems  Constitutional: Negative for appetite change, chills, fatigue, fever and unexpected weight change.  Eyes: Negative for visual disturbance.  Respiratory: Negative for cough, chest tightness,  shortness of breath and wheezing.   Cardiovascular: Positive for chest pain (Left breast pain). Negative for leg swelling.  Gastrointestinal: Negative for abdominal pain, constipation, diarrhea, nausea and vomiting.  Genitourinary: Negative for dysuria, flank pain, frequency, genital sores, hematuria and urgency.  Skin: Negative for rash.  Allergic/Immunologic: Negative for immunocompromised state.  Neurological: Negative for dizziness and headaches.  Hematological: Positive for adenopathy.      Objective:    BP (!) 158/103   Pulse 80   Temp 97.6 F (36.4 C)   Ht 5\' 11"  (1.803 m)   Wt 224 lb (101.6 kg)   BMI 31.24 kg/m  Nursing note and vital signs reviewed.  Physical Exam  Constitutional: He is oriented to person, place, and time. He appears well-developed. No distress.  HENT:  Mouth/Throat: Oropharynx is clear and moist.  Eyes: Conjunctivae are normal.  Neck: Neck supple.  Cardiovascular: Normal rate, regular rhythm, normal heart sounds and intact distal pulses. Exam reveals no gallop and no friction rub.  No murmur heard. Pulmonary/Chest: Effort normal and breath sounds normal. No respiratory distress. He has no wheezes. He has no rales. He exhibits tenderness.  Abdominal: Soft. Bowel sounds are normal. There is no tenderness.  Lymphadenopathy:    He has cervical adenopathy (Right side tonsilar / deep cervical).  Neurological: He is alert and oriented to person, place, and time.  Skin: Skin is warm and dry. No rash noted.  Psychiatric: He has a normal mood and affect. His behavior is normal. Judgment and thought content normal.       Assessment & Plan:   Problem List Items Addressed This Visit      Immune and Lymphatic   Lymphadenopathy    New onset lymphadenopathy which appears previously imaged and likely related to his HIV. He has no fevers, chills or sweats currently. It has improved in size since initial onset. If it remains enlarged will consider additional  imaging.         Other   Human immunodeficiency virus (HIV) disease ([place]) - Primary    Mr. Arneson appears to have very well controlled HIV with a viral load that is undetectable and a CD4 count of 570. He has no adverse side effects and is adherent with his medication. No problems obtaining his medications and continues to be insured through [employer]. He has no signs/symptoms of opportunistic infection or progressing disease through history or physical exam. Prevnar updated today. Continue current dosage of Biktarvy. Plan for follow up in 6 months with blood work 1-2 weeks prior to his appointment.       Relevant Orders   Pneumococcal conjugate vaccine 13-valent IM (Completed)   T-helper cell (CD4)- ([facility] only)   HIV 1 RNA quant-no reflex-bld   CBC   RPR   Comprehensive metabolic panel   Lipid panel   Mastalgia  New onset mastaligia in the left breast with two pea sized firm, mobile masses located at the 12 o'clock and 1 o'clock positions. There is low suspicion for breast cancer however it cannot be ruled out. He does not have any red-flag symptoms. Discussed possibility of mammogram and ultrasound which patient would like to hold off at this time which I do think is reasonable. If his symptoms worsen or do not improve than we will pursue the imaging.        Other Visit Diagnoses    Need for pneumococcal vaccination       Relevant Orders   Pneumococcal conjugate vaccine 13-valent IM (Completed)       I have discontinued [employer]. Rylee's sulfamethoxazole-trimethoprim and doxycycline. I am also having him maintain his bictegravir-emtricitabine-tenofovir AF, traZODone, fluocinonide ointment, and omeprazole.    Follow-up: Return in about 6 months (around 12/30/2018), or if symptoms worsen or fail to improve.   Terri Piedra, MSN, FNP-C Nurse Practitioner [facility] Office phone: [phone] Pager: [place] number: [phone]

## 2018-06-29 NOTE — Assessment & Plan Note (Signed)
New onset mastaligia in the left breast with two pea sized firm, mobile masses located at the 12 o'clock and 1 o'clock positions. There is low suspicion for breast cancer however it cannot be ruled out. He does not have any red-flag symptoms. Discussed possibility of mammogram and ultrasound which patient would like to hold off at this time which I do think is reasonable. If his symptoms worsen or do not improve than we will pursue the imaging.

## 2018-06-29 NOTE — Assessment & Plan Note (Signed)
Hunter Pratt appears to have very well controlled HIV with a viral load that is undetectable and a CD4 count of 570. He has no adverse side effects and is adherent with his medication. No problems obtaining his medications and continues to be insured through [employer]. He has no signs/symptoms of opportunistic infection or progressing disease through history or physical exam. Prevnar updated today. Continue current dosage of Biktarvy. Plan for follow up in 6 months with blood work 1-2 weeks prior to his appointment.

## 2018-06-29 NOTE — Patient Instructions (Signed)
Nice to see you.  If the pain in your left breast does not improve we can order you an ultrasound to determine underlying tissue  If your neck swelling does not improve we can plan to schedule an ultrasound to ensure no further work up is needed.  Please continue to take your [place] as prescribed.  Plan for follow up in 6 months or sooner.  Please schedule a time to get your flu shot towards the beginning of September.

## 2018-06-29 NOTE — Assessment & Plan Note (Signed)
New onset lymphadenopathy which appears previously imaged and likely related to his HIV. He has no fevers, chills or sweats currently. It has improved in size since initial onset. If it remains enlarged will consider additional imaging.

## 2018-06-30 DIAGNOSIS — L309 Dermatitis, unspecified: Secondary | ICD-10-CM | POA: Diagnosis not present

## 2018-06-30 DIAGNOSIS — Z825 Family history of asthma and other chronic lower respiratory diseases: Secondary | ICD-10-CM | POA: Diagnosis not present

## 2018-06-30 DIAGNOSIS — Z833 Family history of diabetes mellitus: Secondary | ICD-10-CM | POA: Diagnosis not present

## 2018-06-30 DIAGNOSIS — E669 Obesity, unspecified: Secondary | ICD-10-CM | POA: Diagnosis not present

## 2018-06-30 DIAGNOSIS — K219 Gastro-esophageal reflux disease without esophagitis: Secondary | ICD-10-CM | POA: Diagnosis not present

## 2018-06-30 DIAGNOSIS — Z7722 Contact with and (suspected) exposure to environmental tobacco smoke (acute) (chronic): Secondary | ICD-10-CM | POA: Diagnosis not present

## 2018-06-30 DIAGNOSIS — Z8249 Family history of ischemic heart disease and other diseases of the circulatory system: Secondary | ICD-10-CM | POA: Diagnosis not present

## 2018-06-30 DIAGNOSIS — G4700 Insomnia, unspecified: Secondary | ICD-10-CM | POA: Diagnosis not present

## 2018-06-30 DIAGNOSIS — R69 Illness, unspecified: Secondary | ICD-10-CM | POA: Diagnosis not present

## 2018-06-30 DIAGNOSIS — Z6831 Body mass index (BMI) 31.0-31.9, adult: Secondary | ICD-10-CM | POA: Diagnosis not present

## 2018-07-30 ENCOUNTER — Ambulatory Visit (AMBULATORY_SURGERY_CENTER): Payer: Self-pay | Admitting: *Deleted

## 2018-07-30 ENCOUNTER — Encounter: Payer: Self-pay | Admitting: Gastroenterology

## 2018-07-30 VITALS — Ht 71.0 in | Wt 216.8 lb

## 2018-07-30 DIAGNOSIS — Z86010 Personal history of colonic polyps: Secondary | ICD-10-CM

## 2018-07-30 MED ORDER — PEG 3350-KCL-NA BICARB-NACL 420 G PO SOLR: 4000.0000 mL | Freq: Once | ORAL | 0 refills | Status: AC

## 2018-07-30 NOTE — Progress Notes (Signed)
No egg or soy allergy known to patient  No issues with past sedation with any surgeries  or procedures, no intubation problems  No diet pills per patient No home 02 use per patient  No blood thinners per patient  Pt denies issues with constipation  No A fib or A flutter  EMMI video sent to pt's e mail  

## 2018-08-04 ENCOUNTER — Ambulatory Visit: Payer: Medicare HMO | Admitting: Family Medicine

## 2018-08-04 DIAGNOSIS — N644 Mastodynia: Secondary | ICD-10-CM

## 2018-08-13 ENCOUNTER — Encounter: Payer: Self-pay | Admitting: Gastroenterology

## 2018-08-13 ENCOUNTER — Ambulatory Visit (AMBULATORY_SURGERY_CENTER): Payer: Medicare HMO | Admitting: Gastroenterology

## 2018-08-13 VITALS — BP 124/79 | HR 71 | Temp 98.9°F | Resp 13 | Ht 71.0 in | Wt 224.0 lb

## 2018-08-13 DIAGNOSIS — Z86010 Personal history of colonic polyps: Secondary | ICD-10-CM

## 2018-08-13 DIAGNOSIS — K219 Gastro-esophageal reflux disease without esophagitis: Secondary | ICD-10-CM | POA: Diagnosis not present

## 2018-08-13 DIAGNOSIS — D123 Benign neoplasm of transverse colon: Secondary | ICD-10-CM

## 2018-08-13 DIAGNOSIS — K5730 Diverticulosis of large intestine without perforation or abscess without bleeding: Secondary | ICD-10-CM

## 2018-08-13 DIAGNOSIS — D122 Benign neoplasm of ascending colon: Secondary | ICD-10-CM

## 2018-08-13 DIAGNOSIS — G4733 Obstructive sleep apnea (adult) (pediatric): Secondary | ICD-10-CM | POA: Diagnosis not present

## 2018-08-13 DIAGNOSIS — D124 Benign neoplasm of descending colon: Secondary | ICD-10-CM

## 2018-08-13 DIAGNOSIS — K635 Polyp of colon: Secondary | ICD-10-CM

## 2018-08-13 MED ORDER — SODIUM CHLORIDE 0.9 % IV SOLN: 500.0000 mL | Freq: Once | INTRAVENOUS | Status: DC

## 2018-08-13 NOTE — Progress Notes (Signed)
Report to PACU, RN, vss, BBS= Clear.  

## 2018-08-13 NOTE — Progress Notes (Signed)
Called to room to assist during endoscopic procedure.  Patient ID and intended procedure confirmed with present staff. Received instructions for my participation in the procedure from the performing physician.  

## 2018-08-13 NOTE — Op Note (Signed)
[facility] Patient Name: Hunter Pratt Procedure Date: 08/13/2018 9:36 AM MRN: [id number] Endoscopist: Milus Banister , MD Age: 62 Referring MD:  Date of Birth: 1956/10/25 Gender: Male Account #: [id number] Procedure:                Colonoscopy Indications:              High risk colon cancer surveillance: Personal                            history of colonic polyps; colonoscopy 2009 single                            subCM adenoma, colonoscopy 2014 two subCM adenomas Medicines:                Monitored Anesthesia Care Procedure:                Pre-Anesthesia Assessment:                           - Prior to the procedure, a History and Physical                            was performed, and patient medications and                            allergies were reviewed. The patient's tolerance of                            previous anesthesia was also reviewed. The risks                            and benefits of the procedure and the sedation                            options and risks were discussed with the patient.                            All questions were answered, and informed consent                            was obtained. Prior Anticoagulants: The patient has                            taken no previous anticoagulant or antiplatelet                            agents. ASA Grade Assessment: II - A patient with                            mild systemic disease. After reviewing the risks                            and benefits, the patient was deemed in  satisfactory condition to undergo the procedure.                           After obtaining informed consent, the colonoscope                            was passed under direct vision. Throughout the                            procedure, the patient's blood pressure, pulse, and                            oxygen saturations were monitored continuously. The                            Colonoscope was  introduced through the anus and                            advanced to the the cecum, identified by                            appendiceal orifice and ileocecal valve. The                            colonoscopy was performed without difficulty. The                            patient tolerated the procedure well. The quality                            of the bowel preparation was good. The terminal                            ileum, ileocecal valve, appendiceal orifice, and                            rectum and the ileocecal valve, appendiceal                            orifice, and rectum were photographed. Scope In: 9:41:27 AM Scope Out: 9:54:48 AM Scope Withdrawal Time: 0 hours 11 minutes 27 seconds  Total Procedure Duration: 0 hours 13 minutes 21 seconds  Findings:                 Six sessile polyps were found in the descending                            colon, transverse colon and ascending colon. The                            polyps were 2 to 6 mm in size. These polyps were                            removed with a cold snare. Resection and  retrieval                            were complete.                           Multiple small and large-mouthed diverticula were                            found in the entire colon.                           The exam was otherwise without abnormality on                            direct and retroflexion views. Complications:            No immediate complications. Estimated blood loss:                            None. Estimated Blood Loss:     Estimated blood loss: none. Impression:               - Six 2 to 6 mm polyps in the descending colon, in                            the transverse colon and in the ascending colon,                            removed with a cold snare. Resected and retrieved.                           - Diverticulosis in the entire examined colon.                           - The examination was otherwise normal on direct                             and retroflexion views. Recommendation:           - Patient has a contact number available for                            emergencies. The signs and symptoms of potential                            delayed complications were discussed with the                            patient. Return to normal activities tomorrow.                            Written discharge instructions were provided to the                            patient.                           -  Resume previous diet.                           - Continue present medications.                           You will receive a letter within 2-3 weeks with the                            pathology results and my final recommendations.                           If the polyp(s) is proven to be 'pre-cancerous' on                            pathology, you will need repeat colonoscopy in 3-5                            years. If the polyp(s) is NOT 'precancerous' on                            pathology then you should repeat colon cancer                            screening in 10 years with colonoscopy without need                            for colon cancer screening by any method prior to                            then (including stool testing). Milus Banister, MD 08/13/2018 9:57:12 AM This report has been signed electronically.

## 2018-08-13 NOTE — Patient Instructions (Signed)
**   Handouts given on polyps and diverticulosis **   YOU HAD AN ENDOSCOPIC PROCEDURE TODAY AT THE [facility]:   Refer to the procedure report that was given to you for any specific questions about what was found during the examination.  If the procedure report does not answer your questions, please call your gastroenterologist to clarify.  If you requested that your care partner not be given the details of your procedure findings, then the procedure report has been included in a sealed envelope for you to review at your convenience later.  YOU SHOULD EXPECT: Some feelings of bloating in the abdomen. Passage of more gas than usual.  Walking can help get rid of the air that was put into your GI tract during the procedure and reduce the bloating. If you had a lower endoscopy (such as a colonoscopy or flexible sigmoidoscopy) you may notice spotting of blood in your stool or on the toilet paper. If you underwent a bowel prep for your procedure, you may not have a normal bowel movement for a few days.  Please Note:  You might notice some irritation and congestion in your nose or some drainage.  This is from the oxygen used during your procedure.  There is no need for concern and it should clear up in a day or so.  SYMPTOMS TO REPORT IMMEDIATELY:   Following lower endoscopy (colonoscopy or flexible sigmoidoscopy):  Excessive amounts of blood in the stool  Significant tenderness or worsening of abdominal pains  Swelling of the abdomen that is new, acute  Fever of 100F or higher  For urgent or emergent issues, a gastroenterologist can be reached at any hour by calling [phone].   DIET:  We do recommend a small meal at first, but then you may proceed to your regular diet.  Drink plenty of fluids but you should avoid alcoholic beverages for 24 hours.  ACTIVITY:  You should plan to take it easy for the rest of today and you should NOT DRIVE or use heavy machinery until tomorrow (because  of the sedation medicines used during the test).    FOLLOW UP: Our staff will call the number listed on your records the next business day following your procedure to check on you and address any questions or concerns that you may have regarding the information given to you following your procedure. If we do not reach you, we will leave a message.  However, if you are feeling well and you are not experiencing any problems, there is no need to return our call.  We will assume that you have returned to your regular daily activities without incident.  If any biopsies were taken you will be contacted by phone or by letter within the next 1-3 weeks.  Please call us at [phone] if you have not heard about the biopsies in 3 weeks.    SIGNATURES/CONFIDENTIALITY: You and/or your care partner have signed paperwork which will be entered into your electronic medical record.  These signatures attest to the fact that that the information above on your After Visit Summary has been reviewed and is understood.  Full responsibility of the confidentiality of this discharge information lies with you and/or your care-partner. 

## 2018-08-13 NOTE — Progress Notes (Signed)
Pt's states no medical or surgical changes since previsit or office visit. 

## 2018-08-16 ENCOUNTER — Telehealth: Payer: Self-pay

## 2018-08-16 NOTE — Telephone Encounter (Signed)
  Follow up Call-  Call [facility] number 08/13/2018  Post procedure Call Tehilla Coffel phone  # [phone]  Permission to leave phone message Yes  Some recent data might be hidden     Patient questions:  Do you have a fever, pain , or abdominal swelling? No. Pain Score  0 *  Have you tolerated food without any problems? Yes.    Have you been able to return to your normal activities? Yes.    Do you have any questions about your discharge instructions: Diet   No. Medications  No. Follow up visit  No.  Do you have questions or concerns about your Care? No.  Actions: * If pain score is 4 or above: No action needed, pain <4.

## 2018-08-19 ENCOUNTER — Encounter: Payer: Self-pay | Admitting: Gastroenterology

## 2018-09-07 ENCOUNTER — Telehealth: Payer: Self-pay

## 2018-09-07 NOTE — Telephone Encounter (Signed)
Called patient to schedule an appointment with flu clinic. Left a message on patient's  [employer] phone asking to call back.  [place]

## 2018-09-13 ENCOUNTER — Encounter: Payer: Self-pay | Admitting: Family Medicine

## 2018-09-13 ENCOUNTER — Ambulatory Visit (INDEPENDENT_AMBULATORY_CARE_PROVIDER_SITE_OTHER): Payer: Medicare HMO | Admitting: Family Medicine

## 2018-09-13 VITALS — BP 130/80 | HR 70 | Temp 97.8°F | Resp 18 | Ht 71.0 in | Wt 220.0 lb

## 2018-09-13 DIAGNOSIS — M67432 Ganglion, left wrist: Secondary | ICD-10-CM

## 2018-09-13 DIAGNOSIS — L02419 Cutaneous abscess of limb, unspecified: Secondary | ICD-10-CM | POA: Diagnosis not present

## 2018-09-13 DIAGNOSIS — N644 Mastodynia: Secondary | ICD-10-CM | POA: Diagnosis not present

## 2018-09-13 DIAGNOSIS — Z23 Encounter for immunization: Secondary | ICD-10-CM

## 2018-09-13 DIAGNOSIS — L03119 Cellulitis of unspecified part of limb: Secondary | ICD-10-CM

## 2018-09-13 NOTE — Addendum Note (Signed)
Addended by: Shary Decamp B on: 09/13/2018 01:01 PM   Modules accepted: Orders

## 2018-09-13 NOTE — Progress Notes (Signed)
Subjective:    Patient ID: Hunter Pratt, male    DOB: 16-Dec-1955, 62 y.o.   MRN: [id number]  HPI  Patient has 3 separate concerns.  #1 patient has a history of a ganglion cyst on the first dorsal compartment of his left wrist just proximal to the anatomic snuffbox.  This is been surgically removed in the past however it is reoccurring.  It is approximately 1.5 cm in diameter.  It is freely mobile.  Is slightly tender to the touch and manipulation.  Previously he saw Dr. Tomie China (spelling?).  He is requesting a referral back to see him again to discuss surgical removal.  Second issue is left breast pain.  He states that his left breast has been tender and sore since mid summer.  He reports pain directly underneath the nipple.  The nipple is tender to palpation and manipulation.  I can appreciate no palpable abnormality in or around the nipple.  There is no redness swelling or visible deformity or fissure or crack or infection.  He reports feeling a lump in his left breast at approximately 5:00.  There is a nodular area there that appears to be normal breast tissue and artifact however he states that this is also sore.  It has been present now for 3 months or more.  #3, the patient was recently seen for an abscess in his upper posterior left thigh just below his gluteus.  The area there today shows a hyperpigmented patch of skin roughly the diameter of a $0.50 piece.  It is brownish to yellow in color.  It is slightly firm.  It is not indurated, tender, or fluctuant.  There is no warmth to the touch.  Patient states that had been much bigger and fluctuant.  He was treated for an abscess with antibiotics and it has improved dramatically however he wanted to recheck this area today.  He states that it originally swelled and developed a "abscess" after he had a colonoscopy.  It had not been there previously Past Medical History:  Diagnosis Date  . Abnormal white blood cell 01/27/2018  . Anemia    past hx   .  Arthritis   . Constipation 01/28/2017  . DDD (degenerative disc disease), cervical   . Ganglion cyst 01/28/2017  . GERD (gastroesophageal reflux disease)   . Hearing loss    Bilateral  . HIV infection ([place])   . Hyperlipidemia   . Kidney stone   . OA (osteoarthritis) of knee   . PUD (peptic ulcer disease)   . Sleep apnea    dx'd no current cpap use  . Systolic dysfunction    EF 40-45%   Past Surgical History:  Procedure Laterality Date  . CERVICAL DISCECTOMY  1990 and 1991  . COLONOSCOPY    . GANGLION CYST EXCISION Left 03/18/2017   Procedure: REMOVAL GANGLION OF LEFT WRIST, TENOLYSIS;  Surgeon: Leandrew Koyanagi, MD;  Location: [facility];  Service: Orthopedics;  Laterality: Left;  . POLYPECTOMY     Current Outpatient Medications on File Prior to Visit  Medication Sig Dispense Refill  . bictegravir-emtricitabine-tenofovir AF (BIKTARVY) 50-200-25 MG TABS tablet Take 1 tablet by mouth daily. 30 tablet 11  . fluocinonide ointment (LIDEX) 0.05 % APPLY EXTERNALLY TO THE AFFECTED AREA TWICE DAILY 30 g 0  . omeprazole (PRILOSEC) 20 MG capsule Take 1 capsule (20 mg total) by mouth daily. 30 capsule 3  . traZODone (DESYREL) 50 MG tablet Take 1 tablet (50 mg  total) by mouth at bedtime as needed for sleep. 90 tablet 3   No current facility-administered medications on file prior to visit.    Allergies  Allergen Reactions  . Naproxen Sodium     REACTION: kidney failure  . Nsaids     REACTION: GI upset  . Other Rash    Black dye   Social History   Socioeconomic History  . Marital status: Divorced    Spouse name: Not on file  . Number of children: Not on file  . Years of education: Not on file  . Highest education level: Not on file  Occupational History  . Not on file  Social Needs  . Financial resource strain: Not on file  . Food insecurity:    Worry: Not on file    Inability: Not on file  . Transportation needs:    Medical: Not on file    Non-medical: Not on  file  Tobacco Use  . Smoking status: Former Smoker    Types: Cigarettes  . Smokeless tobacco: Never Used  Substance and Sexual Activity  . Alcohol use: No  . Drug use: No  . Sexual activity: Never    Comment: refused condoms  Lifestyle  . Physical activity:    Days per week: Not on file    Minutes per session: Not on file  . Stress: Not on file  Relationships  . Social connections:    Talks on phone: Not on file    Gets together: Not on file    Attends religious service: Not on file    Active member of club or organization: Not on file    Attends meetings of clubs or organizations: Not on file    Relationship status: Not on file  . Intimate partner violence:    Fear of current or ex partner: Not on file    Emotionally abused: Not on file    Physically abused: Not on file    Forced sexual activity: Not on file  Other Topics Concern  . Not on file  Social History Narrative  . Not on file     Review of Systems  All other systems reviewed and are negative.      Objective:   Physical Exam  Cardiovascular: Normal rate and regular rhythm.  Pulmonary/Chest: Effort normal and breath sounds normal. Right breast exhibits no inverted nipple, no mass and no nipple discharge. Left breast exhibits tenderness. Left breast exhibits no inverted nipple, no mass, no nipple discharge and no skin change.    Musculoskeletal:       Left wrist: He exhibits decreased range of motion, tenderness and deformity. He exhibits no bony tenderness, no swelling and no effusion.       Left upper leg: He exhibits no tenderness and no swelling.       Legs: Vitals reviewed.         Assessment & Plan:  Mastalgia - Plan: MM Digital Diagnostic Bilat  Ganglion cyst of dorsum of left wrist  Cellulitis and abscess of leg  Nipple pain - Plan: MM Digital Diagnostic Bilat  I will refer the patient back to his orthopedic surgeon to discuss excision of the recurrent ganglion cyst.  I will schedule  the patient for a diagnostic mammogram and/or ultrasound given the persistent mastalgia nipple pain.  However on exam today, I can find no concerning feature or mass some for malignancy.  I tried to reassure the patient that I feel the nipples tenderness is likely due  to nerve irritation.  However I believe the patient would benefit from piece of mind as the pain has persisted now for 3 months.  I am uncertain if the patient had a small abscess versus an inflamed sebaceous cyst.  The skin on his upper posterior medial thigh is really firm and hypopigmented consistent with resolving fibrosis and inflammation.  There is no active cellulitis or abscess today.  Therefore no treatment is warranted.  Certainly nothing is present that requires incision and drainage.  Would monitor that area clinically.  If recurrent, will likely need incision and drainage.

## 2018-09-17 ENCOUNTER — Ambulatory Visit (INDEPENDENT_AMBULATORY_CARE_PROVIDER_SITE_OTHER): Payer: Medicare HMO | Admitting: Orthopaedic Surgery

## 2018-09-17 ENCOUNTER — Ambulatory Visit (INDEPENDENT_AMBULATORY_CARE_PROVIDER_SITE_OTHER): Payer: Medicare HMO

## 2018-09-17 ENCOUNTER — Ambulatory Visit (INDEPENDENT_AMBULATORY_CARE_PROVIDER_SITE_OTHER): Payer: Self-pay

## 2018-09-17 ENCOUNTER — Encounter (INDEPENDENT_AMBULATORY_CARE_PROVIDER_SITE_OTHER): Payer: Self-pay | Admitting: Orthopaedic Surgery

## 2018-09-17 DIAGNOSIS — M25561 Pain in right knee: Secondary | ICD-10-CM

## 2018-09-17 DIAGNOSIS — M67432 Ganglion, left wrist: Secondary | ICD-10-CM

## 2018-09-17 DIAGNOSIS — G8929 Other chronic pain: Secondary | ICD-10-CM

## 2018-09-17 DIAGNOSIS — M25562 Pain in left knee: Secondary | ICD-10-CM

## 2018-09-17 MED ORDER — LIDOCAINE HCL 1 % IJ SOLN
2.0000 mL | INTRAMUSCULAR | Status: AC | PRN
  Administered 2018-09-17: 2 mL

## 2018-09-17 MED ORDER — METHYLPREDNISOLONE ACETATE 40 MG/ML IJ SUSP
40.0000 mg | INTRAMUSCULAR | Status: AC | PRN
  Administered 2018-09-17: 40 mg via INTRA_ARTICULAR

## 2018-09-17 MED ORDER — BUPIVACAINE HCL 0.5 % IJ SOLN
2.0000 mL | INTRAMUSCULAR | Status: AC | PRN
  Administered 2018-09-17: 2 mL via INTRA_ARTICULAR

## 2018-09-17 NOTE — Progress Notes (Signed)
Office Visit Note   Patient: Hunter Pratt           Date of Birth: 1956-01-30           MRN: [id number] Visit Date: 09/17/2018              Requested by: Susy Frizzle, MD [address] [facility] [place], [place] [address] PCP: Alycia Rossetti, MD   Assessment & Plan: Visit Diagnoses:  1. Ganglion cyst of volar aspect of left wrist   2. Chronic pain of both knees     Plan: Impression is a symptomatic left wrist volar ganglion cyst and bilateral knee moderate osteoarthritis.  In terms of the cyst we will just keep an eye on it since it does not really bother him.  In terms of his knees I did give him bilateral knee injections today.  Patient tolerated this well.  Questions encouraged and answered.  Follow-up as needed.  Follow-Up Instructions: Return if symptoms worsen or fail to improve.   Orders:  Orders Placed This Encounter  Procedures  . XR Knee Complete 4 Views Left  . XR Knee Complete 4 Views Right   No orders of the defined types were placed in this encounter.     Procedures: Large Joint Inj: bilateral knee on 09/17/2018 9:27 AM Indications: pain Details: 22 G needle  Arthrogram: No  Medications (Right): 2 mL lidocaine 1 %; 2 mL bupivacaine 0.5 %; 40 mg methylPREDNISolone acetate 40 MG/ML Medications (Left): 2 mL lidocaine 1 %; 2 mL bupivacaine 0.5 %; 40 mg methylPREDNISolone acetate 40 MG/ML Outcome: tolerated well, no immediate complications Patient was prepped and draped in the usual sterile fashion.       Clinical Data: No additional findings.   Subjective: Chief Complaint  Patient presents with  . Left Wrist - Pain    Hunter Pratt is a 62 year old gentleman who comes in with 3 separate issues.  First 1 is left volar ganglion cyst.  We remove this about a year and a half ago now he has a recurrence of this but has much smaller and is localized to a small area.  This does not bother him he just mainly wants reassurance and get it checked out.  The  other 2 are bilateral knee pain with stiffness and increased symptoms with prolonged standing and increase activity.  Denies any numbness and tingling.  He has previously had cortisone injections in his knees with significant and prolonged relief.   Review of Systems  Constitutional: Negative.   All other systems reviewed and are negative.    Objective: Vital Signs: There were no vitals taken for this visit.  Physical Exam  Constitutional: He is oriented to person, place, and time. He appears well-developed and well-nourished.  HENT:  Head: Normocephalic and atraumatic.  Eyes: Pupils are equal, round, and reactive to light.  Neck: Neck supple.  Pulmonary/Chest: Effort normal.  Abdominal: Soft.  Musculoskeletal: Normal range of motion.  Neurological: He is alert and oriented to person, place, and time.  Skin: Skin is warm.  Psychiatric: He has a normal mood and affect. His behavior is normal. Judgment and thought content normal.  Nursing note and vitals reviewed.   Ortho Exam Left wrist exam shows a fully healed surgical scar.  There is a small volar ganglion cyst in the area of the previous surgical site.  There is no abnormal skin changes and this does not bother him.  Bilateral knee exam shows no joint  effusion.  Collaterals and cruciates are stable.  Normal range of motion. Specialty Comments:  No specialty comments available.  Imaging: Xr Knee Complete 4 Views Left  Result Date: 09/17/2018 Bilateral moderate osteoarthritis with periarticular spurring.  Xr Knee Complete 4 Views Right  Result Date: 09/17/2018 Bilateral moderate osteoarthritis with periarticular spurring.    PMFS History: Patient Active Problem List   Diagnosis Date Noted  . Mastalgia 06/29/2018  . Abnormal white blood cell 01/27/2018  . Tenosynovitis of left wrist 03/18/2017  . Constipation 01/28/2017  . Insomnia 03/03/2014  . Cervical spondylosis with radiculopathy 09/14/2013  . DDD  (degenerative disc disease), cervical 08/11/2013  . HTN (hypertension) 07/27/2013  . Lymphadenopathy 03/04/2013  . Eczematous dermatitis 11/14/2012  . Overweight 11/14/2012  . Systolic dysfunction 93/73/4287  . Diabetes type 2, controlled ([place]) 07/30/2012  . Lower extremity edema 06/09/2012  . OSA (obstructive sleep apnea) 06/09/2012  . DEGENERATIVE JOINT DISEASE, KNEES, BILATERAL 01/30/2009  . Hyperlipidemia 04/21/2008  . GERD 04/21/2008  . MOOD DISORDER IN CONDITIONS CLASSIFIED ELSEWHERE 02/28/2008  . Hereditary and idiopathic peripheral neuropathy 02/28/2008  . PUD 01/03/2008  . Human immunodeficiency virus (HIV) disease ([place]) 12/28/2006   Past Medical History:  Diagnosis Date  . Abnormal white blood cell 01/27/2018  . Anemia    past hx   . Arthritis   . Constipation 01/28/2017  . DDD (degenerative disc disease), cervical   . Ganglion cyst 01/28/2017  . GERD (gastroesophageal reflux disease)   . Hearing loss    Bilateral  . HIV infection ([place])   . Hyperlipidemia   . Kidney stone   . OA (osteoarthritis) of knee   . PUD (peptic ulcer disease)   . Sleep apnea    dx'd no current cpap use  . Systolic dysfunction    EF 40-45%    Family History  Problem Relation Age of Onset  . Hypertension Mother   . Osteoporosis Mother   . Osteoporosis Father   . Depression Father   . Hearing loss Father   . Colon polyps Father   . Osteoporosis Sister   . Arthritis Sister   . Hypertension Brother   . Hyperlipidemia Brother   . Depression Brother   . Colon cancer Neg Hx   . Rectal cancer Neg Hx   . Stomach cancer Neg Hx   . Esophageal cancer Neg Hx     Past Surgical History:  Procedure Laterality Date  . CERVICAL DISCECTOMY  1990 and 1991  . COLONOSCOPY    . GANGLION CYST EXCISION Left 03/18/2017   Procedure: REMOVAL GANGLION OF LEFT WRIST, TENOLYSIS;  Surgeon: Leandrew Koyanagi, MD;  Location: [facility];  Service: Orthopedics;  Laterality: Left;  . POLYPECTOMY      Social History   Occupational History  . Not on file  Tobacco Use  . Smoking status: Former Smoker    Types: Cigarettes  . Smokeless tobacco: Never Used  Substance and Sexual Activity  . Alcohol use: No  . Drug use: No  . Sexual activity: Never    Comment: refused condoms

## 2018-09-20 ENCOUNTER — Other Ambulatory Visit: Payer: Self-pay | Admitting: Family Medicine

## 2018-10-11 ENCOUNTER — Other Ambulatory Visit: Payer: Self-pay | Admitting: Infectious Disease

## 2018-10-11 DIAGNOSIS — I10 Essential (primary) hypertension: Secondary | ICD-10-CM

## 2018-11-08 ENCOUNTER — Other Ambulatory Visit: Payer: Self-pay | Admitting: Infectious Disease

## 2018-11-08 DIAGNOSIS — I10 Essential (primary) hypertension: Secondary | ICD-10-CM

## 2018-11-08 DIAGNOSIS — B20 Human immunodeficiency virus [HIV] disease: Secondary | ICD-10-CM

## 2018-12-03 ENCOUNTER — Other Ambulatory Visit: Payer: Self-pay | Admitting: Infectious Disease

## 2018-12-03 DIAGNOSIS — B20 Human immunodeficiency virus [HIV] disease: Secondary | ICD-10-CM

## 2018-12-16 ENCOUNTER — Other Ambulatory Visit: Payer: Medicare HMO

## 2018-12-16 ENCOUNTER — Ambulatory Visit: Payer: Medicare HMO

## 2018-12-16 DIAGNOSIS — Z79899 Other long term (current) drug therapy: Secondary | ICD-10-CM | POA: Diagnosis not present

## 2018-12-16 DIAGNOSIS — R69 Illness, unspecified: Secondary | ICD-10-CM | POA: Diagnosis not present

## 2018-12-16 DIAGNOSIS — G4733 Obstructive sleep apnea (adult) (pediatric): Secondary | ICD-10-CM | POA: Diagnosis not present

## 2018-12-16 DIAGNOSIS — I10 Essential (primary) hypertension: Secondary | ICD-10-CM | POA: Diagnosis not present

## 2018-12-16 DIAGNOSIS — R1084 Generalized abdominal pain: Secondary | ICD-10-CM | POA: Diagnosis not present

## 2018-12-16 DIAGNOSIS — B20 Human immunodeficiency virus [HIV] disease: Secondary | ICD-10-CM

## 2018-12-17 LAB — T-HELPER CELL (CD4) - (RCID CLINIC ONLY)
CD4 % Helper T Cell: 21 % — ABNORMAL LOW (ref 33–55)
CD4 T Cell Abs: 550 /uL (ref 400–2700)

## 2018-12-18 LAB — COMPREHENSIVE METABOLIC PANEL
AG Ratio: 1.2 (calc) (ref 1.0–2.5)
ALT: 15 U/L (ref 9–46)
AST: 16 U/L (ref 10–35)
Albumin: 4.3 g/dL (ref 3.6–5.1)
Alkaline phosphatase (APISO): 97 U/L (ref 40–115)
BUN: 14 mg/dL (ref 7–25)
CO2: 26 mmol/L (ref 20–32)
Calcium: 9.8 mg/dL (ref 8.6–10.3)
Chloride: 103 mmol/L (ref 98–110)
Creat: 1.15 mg/dL (ref 0.70–1.25)
Globulin: 3.5 g/dL (calc) (ref 1.9–3.7)
Glucose, Bld: 130 mg/dL — ABNORMAL HIGH (ref 65–99)
Potassium: 4.4 mmol/L (ref 3.5–5.3)
Sodium: 137 mmol/L (ref 135–146)
Total Bilirubin: 0.5 mg/dL (ref 0.2–1.2)
Total Protein: 7.8 g/dL (ref 6.1–8.1)

## 2018-12-18 LAB — HIV-1 RNA QUANT-NO REFLEX-BLD
HIV 1 RNA Quant: <20 copies/mL
HIV-1 RNA Quant, Log: <1.3 Log copies/mL

## 2018-12-18 LAB — CBC
HCT: 50.1 % — ABNORMAL HIGH (ref 38.5–50.0)
Hemoglobin: 18.1 g/dL — ABNORMAL HIGH (ref 13.2–17.1)
MCH: 33.7 pg — ABNORMAL HIGH (ref 27.0–33.0)
MCHC: 36.1 g/dL — ABNORMAL HIGH (ref 32.0–36.0)
MCV: 93.3 fL (ref 80.0–100.0)
MPV: 9.7 fL (ref 7.5–12.5)
Platelets: 321 10*3/uL (ref 140–400)
RBC: 5.37 10*6/uL (ref 4.20–5.80)
RDW: 13.1 % (ref 11.0–15.0)
WBC: 9.4 10*3/uL (ref 3.8–10.8)

## 2018-12-18 LAB — LIPID PANEL
Cholesterol: 203 mg/dL — ABNORMAL HIGH (ref <200)
HDL: 37 mg/dL — ABNORMAL LOW (ref >40)
LDL Cholesterol (Calc): 127 mg/dL (calc) — ABNORMAL HIGH
Non-HDL Cholesterol (Calc): 166 mg/dL (calc) — ABNORMAL HIGH (ref <130)
Total CHOL/HDL Ratio: 5.5 (calc) — ABNORMAL HIGH (ref <5.0)
Triglycerides: 254 mg/dL — ABNORMAL HIGH (ref <150)

## 2018-12-18 LAB — RPR: RPR Ser Ql: NONREACTIVE

## 2018-12-21 ENCOUNTER — Encounter: Payer: Self-pay | Admitting: Family Medicine

## 2018-12-21 ENCOUNTER — Other Ambulatory Visit: Payer: Self-pay

## 2018-12-21 ENCOUNTER — Ambulatory Visit (INDEPENDENT_AMBULATORY_CARE_PROVIDER_SITE_OTHER): Payer: Medicare HMO | Admitting: Family Medicine

## 2018-12-21 VITALS — BP 128/68 | HR 82 | Temp 98.5°F | Resp 14 | Ht 71.0 in | Wt 227.0 lb

## 2018-12-21 DIAGNOSIS — R1010 Upper abdominal pain, unspecified: Secondary | ICD-10-CM

## 2018-12-21 DIAGNOSIS — L02415 Cutaneous abscess of right lower limb: Secondary | ICD-10-CM | POA: Diagnosis not present

## 2018-12-21 MED ORDER — OMEPRAZOLE 20 MG PO CPDR: DELAYED_RELEASE_CAPSULE | ORAL | 3 refills | Status: DC

## 2018-12-21 MED ORDER — SULFAMETHOXAZOLE-TRIMETHOPRIM 800-160 MG PO TABS: 1.0000 | ORAL_TABLET | Freq: Two times a day (BID) | ORAL | 0 refills | Status: DC

## 2018-12-21 NOTE — Progress Notes (Signed)
   Subjective:    Patient ID: Hunter Pratt, male    DOB: 12-27-1955, 63 y.o.   MRN: [id number]  Patient presents for Cyst to Leg (upper back of R Thigh- not draining- has been treated in past for area with ABTx) and ABD Pain (x days- has achy stomach)  Pt here has constant nausea irritating upset stomach for past few days, no reflux symptoms  taking prilosec 20mg  once a day. He has cut out some different foods and a fiber supplement which he feels may be causing his symptoms No diarrhea,no blood in stool Had labs done last week reviewed at bedside- HIV undetectable  Skin- right leg quarter size abscess with erythema, minimal fluctance,no induration,mild TTP, has history of abscess     Review Of Systems:  GEN- denies fatigue, fever, weight loss,weakness, recent illness HEENT- denies eye drainage, change in vision, nasal discharge, CVS- denies chest pain, palpitations RESP- denies SOB, cough, wheeze ABD- denies N/V, change in stools, abd pain GU- denies dysuria, hematuria, dribbling, incontinence MSK- denies joint pain, muscle aches, injury Neuro- denies headache, dizziness, syncope, seizure activity       Objective:    BP 128/68   Pulse 82   Temp 98.5 F (36.9 C) (Oral)   Resp 14   Ht 5\' 11"  (1.803 m)   Wt 227 lb (103 kg)   SpO2 98%   BMI 31.66 kg/m  GEN- NAD, alert and oriented x3 HEENT- PERRL, EOMI, non injected sclera, pink conjunctiva, MMM, oropharynx clear Neck- Supple, no LAD CVS- RRR, no murmur RESP-CTAB ABD-NABS,soft,NT,ND Skin- right post thigh quarter size abscess, mild warmth and erythema, scab in center, mild TTP EXT- No edema Pulses- Radial 2+        Assessment & Plan:      Problem List Items Addressed This Visit    None    Visit Diagnoses    Abscess of right leg    -  Primary   minimal fluctance, will not drainge, start bactrim, warm compresses, not improved will open up   Pain of upper abdomen       benign exam, he is cutting out some  foods he feel is irritatiing , normal labs, increase PPI to 20mg  BID omeprazole      Note: This dictation was prepared with Dragon dictation along with smaller phrase technology. Any transcriptional errors that result from this process are unintentional.

## 2018-12-21 NOTE — Patient Instructions (Addendum)
F/U Mid June for Physical

## 2018-12-24 ENCOUNTER — Telehealth: Payer: Self-pay | Admitting: *Deleted

## 2018-12-24 NOTE — Telephone Encounter (Signed)
Received request from pharmacy for PA on Omeprazole.   PA submitted.   Dx: K21.9- GERD.

## 2018-12-24 NOTE — Telephone Encounter (Signed)
[employer] has not yet replied to your PA request. You may close this dialog, return to your dashboard, and perform other tasks.  To check for an update later, open this request again from your dashboard.  If [employer] has not replied to your request within 24 hours please contact [employer] at [phone] 

## 2018-12-27 MED ORDER — OMEPRAZOLE 20 MG PO CPDR: DELAYED_RELEASE_CAPSULE | ORAL | 3 refills | Status: DC

## 2018-12-27 NOTE — Telephone Encounter (Signed)
Received PA determination.   PA approved 12/01/2018- 12/01/2019.  Pharmacy made aware.

## 2018-12-30 ENCOUNTER — Other Ambulatory Visit: Payer: Self-pay | Admitting: Infectious Disease

## 2018-12-30 ENCOUNTER — Encounter: Payer: Self-pay | Admitting: Infectious Disease

## 2018-12-30 ENCOUNTER — Ambulatory Visit: Payer: Medicare HMO | Admitting: Infectious Disease

## 2018-12-30 VITALS — BP 123/86 | HR 89 | Temp 97.7°F | Wt 226.0 lb

## 2018-12-30 DIAGNOSIS — I10 Essential (primary) hypertension: Secondary | ICD-10-CM

## 2018-12-30 DIAGNOSIS — R69 Illness, unspecified: Secondary | ICD-10-CM | POA: Diagnosis not present

## 2018-12-30 DIAGNOSIS — D751 Secondary polycythemia: Secondary | ICD-10-CM | POA: Diagnosis not present

## 2018-12-30 DIAGNOSIS — R1084 Generalized abdominal pain: Secondary | ICD-10-CM | POA: Diagnosis not present

## 2018-12-30 DIAGNOSIS — B20 Human immunodeficiency virus [HIV] disease: Secondary | ICD-10-CM | POA: Diagnosis not present

## 2018-12-30 DIAGNOSIS — G4733 Obstructive sleep apnea (adult) (pediatric): Secondary | ICD-10-CM | POA: Diagnosis not present

## 2018-12-30 DIAGNOSIS — Z79899 Other long term (current) drug therapy: Secondary | ICD-10-CM

## 2018-12-30 HISTORY — DX: Generalized abdominal pain: R10.84

## 2018-12-30 NOTE — Progress Notes (Signed)
Needs to get plugged back to him with pulmonary sleep medicine I cannot find since studies he is not using his CPAP machine

## 2018-12-30 NOTE — Progress Notes (Signed)
Chief complaint: Diffuse abdominal pain and bloating  Subjective:    Patient ID: Hunter Pratt, male    DOB: 1956-09-14, 63 y.o.   MRN: [id number]  HPI  63  Year old male who is doing superbly well on his antiviral regimen, of intelence, Isentress both QDaily and Descovy --> BIKTARVY\  He has done well and maintained perfect virological suppression.   He comes in complaining of abdominal pain is been going on for several months.  He states this had happened after he had had a colonoscopy where follow-ups were found.  He was instructed to start taking fiber pills which she has been doing which now has been causing bloating and abdominal pain.  He never had backed off the fiber.  He did see his primary care physician recently.  He is feeling a little bit better as he had a large bowel movement yesterday but still has diffuse abdominal pain on exam.  I have encouraged him to see his GI doctor for further recommendation and work-up.    Past Medical History:  Diagnosis Date  . Abnormal white blood cell 01/27/2018  . Anemia    past hx   . Arthritis   . Constipation 01/28/2017  . DDD (degenerative disc disease), cervical   . Ganglion cyst 01/28/2017  . GERD (gastroesophageal reflux disease)   . Hearing loss    Bilateral  . HIV infection ([place])   . Hyperlipidemia   . Kidney stone   . OA (osteoarthritis) of knee   . PUD (peptic ulcer disease)   . Sleep apnea    dx'd no current cpap use  . Systolic dysfunction    EF 40-45%    Past Surgical History:  Procedure Laterality Date  . CERVICAL DISCECTOMY  1990 and 1991  . COLONOSCOPY    . GANGLION CYST EXCISION Left 03/18/2017   Procedure: REMOVAL GANGLION OF LEFT WRIST, TENOLYSIS;  Surgeon: Leandrew Koyanagi, MD;  Location: [facility];  Service: Orthopedics;  Laterality: Left;  . POLYPECTOMY      Family History  Problem Relation Age of Onset  . Hypertension Mother   . Osteoporosis Mother   . Osteoporosis Father   .  Depression Father   . Hearing loss Father   . Colon polyps Father   . Osteoporosis Sister   . Arthritis Sister   . Hypertension Brother   . Hyperlipidemia Brother   . Depression Brother   . Colon cancer Neg Hx   . Rectal cancer Neg Hx   . Stomach cancer Neg Hx   . Esophageal cancer Neg Hx       Social History   Socioeconomic History  . Marital status: Divorced    Spouse name: Not on file  . Number of children: Not on file  . Years of education: Not on file  . Highest education level: Not on file  Occupational History  . Not on file  Social Needs  . Financial resource strain: Not on file  . Food insecurity:    Worry: Not on file    Inability: Not on file  . Transportation needs:    Medical: Not on file    Non-medical: Not on file  Tobacco Use  . Smoking status: Former Smoker    Types: Cigarettes  . Smokeless tobacco: Never Used  Substance and Sexual Activity  . Alcohol use: No  . Drug use: No  . Sexual activity: Never    Comment: refused condoms  Lifestyle  .  Physical activity:    Days per week: Not on file    Minutes per session: Not on file  . Stress: Not on file  Relationships  . Social connections:    Talks on phone: Not on file    Gets together: Not on file    Attends religious service: Not on file    Active member of club or organization: Not on file    Attends meetings of clubs or organizations: Not on file    Relationship status: Not on file  Other Topics Concern  . Not on file  Social History Narrative  . Not on file    Allergies  Allergen Reactions  . Naproxen Sodium     REACTION: kidney failure  . Nsaids     REACTION: GI upset  . Other Rash    Black dye     Current Outpatient Medications:  .  BIKTARVY 50-200-25 MG TABS tablet, TAKE 1 TABLET BY MOUTH DAILY, Disp: 30 tablet, Rfl: 2 .  fluocinonide ointment (LIDEX) 0.05 %, APPLY EXTERNALLY TO THE AFFECTED AREA TWICE DAILY, Disp: 30 g, Rfl: 0 .  omeprazole (PRILOSEC) 20 MG capsule, Take  1 tablet twice a day, Disp: 60 capsule, Rfl: 3 .  sulfamethoxazole-trimethoprim (BACTRIM DS,SEPTRA DS) 800-160 MG tablet, Take 1 tablet by mouth 2 (two) times daily., Disp: 20 tablet, Rfl: 0 .  traZODone (DESYREL) 50 MG tablet, Take 1 tablet (50 mg total) by mouth at bedtime as needed for sleep., Disp: 90 tablet, Rfl: 3  Review of Systems  Constitutional: Negative for chills and fever.  HENT: Negative for congestion and sore throat.   Eyes: Negative for photophobia.  Respiratory: Negative for cough, shortness of breath and wheezing.   Cardiovascular: Negative for chest pain, palpitations and leg swelling.  Gastrointestinal: Positive for abdominal pain. Negative for blood in stool, constipation, diarrhea, nausea and vomiting.  Genitourinary: Negative for dysuria, flank pain and hematuria.  Musculoskeletal: Negative for back pain and myalgias.  Skin: Negative for rash.  Neurological: Negative for dizziness, weakness and headaches.  Hematological: Does not bruise/bleed easily.  Psychiatric/Behavioral: Negative for suicidal ideas.       Objective:   Physical Exam  Constitutional: He is oriented to person, place, and time. He appears well-developed and well-nourished. No distress.  HENT:  Head: Normocephalic and atraumatic.  Mouth/Throat: No oropharyngeal exudate.  Eyes: Conjunctivae and EOM are normal. No scleral icterus.  Neck: Normal range of motion. Neck supple.  Cardiovascular: Normal rate, regular rhythm and normal heart sounds. Exam reveals no gallop and no friction rub.  No murmur heard. Pulmonary/Chest: Effort normal and breath sounds normal. No respiratory distress. He has no wheezes. He has no rales.  Abdominal: Soft. Bowel sounds are normal. He exhibits no distension and no mass. There is abdominal tenderness. There is no rebound and no guarding.  Musculoskeletal:        General: No tenderness or edema.  Neurological: He is alert and oriented to person, place, and time. He  exhibits normal muscle tone. Coordination normal.  Skin: Skin is warm and dry. No rash noted. He is not diaphoretic. No erythema. No pallor.  Psychiatric: He has a normal mood and affect. His behavior is normal. Judgment and thought content normal.  Nursing note and vitals reviewed.    Assessment & Plan:   HIV: continue  BIKTARVY, return to clinic in 6 months time.  HTN  Has been on beta blocker Vitals:   12/30/18 0938  BP: 123/86  Pulse: 89  Temp: 97.7 F (36.5 C)     Hyperlipidemia: followed by PCP, DL is elevated he told me he does not have diabetes and morbid he has gained 10 pounds I wonder about that.  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  He said he was provided with a machine but the machine took up the entire room and it heated up his room he dislikes it.  He really does need to get back to using a sleep apnea BiPAP or CPAP machine and I have asked him to go and see his pulmonary sleep medicine doctor.  Untreated obstructive sleep apnea: I again emphasized how important it is for this to be treated.  I spent greater than 25 minutes with the patient including greater than 50% of time in face to face counsel of the patient wearing the need to get reengaged with his pulmonary sleep medicine physician to get him up properly fitting of CPAP or BiPAP to treat his obstructive sleep apnea, and the consequences of not doing so including worsening polycythemia with risk of stroke, continued hypoxemia potential daytime somnolence and heart failure so need to get back in with primary care and GI and in coordination of his care.

## 2019-01-10 ENCOUNTER — Encounter: Payer: Self-pay | Admitting: Infectious Disease

## 2019-01-14 ENCOUNTER — Ambulatory Visit (HOSPITAL_COMMUNITY)
Admission: EM | Admit: 2019-01-14 | Discharge: 2019-01-14 | Disposition: A | Discharge status: Home / Self Care | Payer: Medicare HMO | Attending: Family Medicine | Admitting: Family Medicine

## 2019-01-14 ENCOUNTER — Encounter (HOSPITAL_COMMUNITY): Payer: Self-pay

## 2019-01-14 DIAGNOSIS — L0291 Cutaneous abscess, unspecified: Secondary | ICD-10-CM | POA: Insufficient documentation

## 2019-01-14 DIAGNOSIS — L02415 Cutaneous abscess of right lower limb: Secondary | ICD-10-CM

## 2019-01-14 MED ORDER — DOXYCYCLINE HYCLATE 100 MG PO TABS: 100.0000 mg | ORAL_TABLET | Freq: Two times a day (BID) | ORAL | 0 refills | Status: DC

## 2019-01-14 NOTE — ED Provider Notes (Signed)
[place]    CSN: [id number] Arrival date & time: 01/14/19  2130     History   Chief Complaint Chief Complaint  Patient presents with  . Abscess    HPI Hunter Pratt is a 63 y.o. male.   New patient to Urgent Care.  Reports ongoing abscess to right posterior medial thigh. Has been treated with antibiotics in the past with no relief. Has been soaking in hot water. Reports no previous I&D on site.     Past Medical History:  Diagnosis Date  . Abnormal white blood cell 01/27/2018  . Anemia    past hx   . Arthritis   . Constipation 01/28/2017  . DDD (degenerative disc disease), cervical   . Diffuse abdominal pain 12/30/2018  . Ganglion cyst 01/28/2017  . GERD (gastroesophageal reflux disease)   . Hearing loss    Bilateral  . HIV infection ([place])   . Hyperlipidemia   . Kidney stone   . OA (osteoarthritis) of knee   . PUD (peptic ulcer disease)   . Sleep apnea    dx'd no current cpap use  . Systolic dysfunction    EF 40-45%    Patient Active Problem List   Diagnosis Date Noted  . Diffuse abdominal pain 12/30/2018  . Mastalgia 06/29/2018  . Abnormal white blood cell 01/27/2018  . Tenosynovitis of left wrist 03/18/2017  . Constipation 01/28/2017  . Insomnia 03/03/2014  . Cervical spondylosis with radiculopathy 09/14/2013  . DDD (degenerative disc disease), cervical 08/11/2013  . HTN (hypertension) 07/27/2013  . Lymphadenopathy 03/04/2013  . Eczematous dermatitis 11/14/2012  . Overweight 11/14/2012  . Systolic dysfunction 86/57/8469  . Diabetes type 2, controlled ([place]) 07/30/2012  . Lower extremity edema 06/09/2012  . OSA (obstructive sleep apnea) 06/09/2012  . DEGENERATIVE JOINT DISEASE, KNEES, BILATERAL 01/30/2009  . Secondary polycythemia 05/22/2008  . Hyperlipidemia 04/21/2008  . GERD 04/21/2008  . MOOD DISORDER IN CONDITIONS CLASSIFIED ELSEWHERE 02/28/2008  . Hereditary and idiopathic peripheral neuropathy 02/28/2008  . PUD 01/03/2008  .  Human immunodeficiency virus (HIV) disease ([place]) 12/28/2006    Past Surgical History:  Procedure Laterality Date  . CERVICAL DISCECTOMY  1990 and 1991  . COLONOSCOPY    . GANGLION CYST EXCISION Left 03/18/2017   Procedure: REMOVAL GANGLION OF LEFT WRIST, TENOLYSIS;  Surgeon: Leandrew Koyanagi, MD;  Location: [facility];  Service: Orthopedics;  Laterality: Left;  . POLYPECTOMY      OB History   No obstetric history on file.      Home Medications    Prior to Admission medications   Medication Sig Start Date End Date Taking? Authorizing Provider  BIKTARVY 50-200-25 MG TABS tablet TAKE 1 TABLET BY MOUTH DAILY 12/03/18   Tommy Medal, Lavell Islam, MD  doxycycline (VIBRA-TABS) 100 MG tablet Take 1 tablet (100 mg total) by mouth 2 (two) times daily. 01/14/19   Robyn Haber, MD  fluocinonide ointment (LIDEX) 0.05 % APPLY EXTERNALLY TO THE AFFECTED AREA TWICE DAILY 02/01/18   Alycia Rossetti, MD  omeprazole (PRILOSEC) 20 MG capsule Take 1 tablet twice a day 12/27/18   Alycia Rossetti, MD  traZODone (DESYREL) 50 MG tablet Take 1 tablet (50 mg total) by mouth at bedtime as needed for sleep. 02/01/18   Alycia Rossetti, MD    Family History Family History  Problem Relation Age of Onset  . Hypertension Mother   . Osteoporosis Mother   . Osteoporosis Father   . Depression Father   .  Hearing loss Father   . Colon polyps Father   . Osteoporosis Sister   . Arthritis Sister   . Hypertension Brother   . Hyperlipidemia Brother   . Depression Brother   . Colon cancer Neg Hx   . Rectal cancer Neg Hx   . Stomach cancer Neg Hx   . Esophageal cancer Neg Hx     Social History Social History   Tobacco Use  . Smoking status: Former Smoker    Types: Cigarettes  . Smokeless tobacco: Never Used  Substance Use Topics  . Alcohol use: No  . Drug use: No     Allergies   Naproxen sodium; Nsaids; and Other   Review of Systems Review of Systems  Musculoskeletal: Negative for  arthralgias.  Skin:       Swollen, red are as described in HPI      Physical Exam Triage Vital Signs ED Triage Vitals  Enc Vitals Group     BP 01/14/19 0813 (!) 128/95     Pulse Rate 01/14/19 0813 84     Resp 01/14/19 0813 14     Temp 01/14/19 0813 (!) 97.5 F (36.4 C)     Temp Source 01/14/19 0813 Oral     SpO2 01/14/19 0813 91 %     Weight --      Height --      Head Circumference --      Peak Flow --      Pain Score 01/14/19 0815 9     Pain Loc --      Pain Edu? --      Excl. in [place]? --    No data found.  Updated Vital Signs BP (!) 128/95 (BP Location: Left Arm)   Pulse 84   Temp (!) 97.5 F (36.4 C) (Oral)   Resp 14   SpO2 91%    Physical Exam Vitals signs and nursing note reviewed.  Constitutional:      Appearance: Normal appearance.  HENT:     Mouth/Throat:     Mouth: Mucous membranes are moist.  Cardiovascular:     Rate and Rhythm: Normal rate and regular rhythm.  Pulmonary:     Effort: Pulmonary effort is normal.  Skin:    General: Skin is warm and dry.     Findings: Abscess present.          Comments: 6 cm red, raised area to right inner thigh, surrounding localized cellulitis, 3 cm fluctuant area in center of abscess   Neurological:     Mental Status: He is alert.      UC Treatments / Results  Labs (all labs ordered are listed, but only abnormal results are displayed) Labs Reviewed  AEROBIC/ANAEROBIC CULTURE (SURGICAL/DEEP WOUND)    EKG None  Radiology No results found.  Procedures Incision and Drainage Date/Time: 01/14/2019 8:56 AM Performed by: Robyn Haber, MD Authorized by: Robyn Haber, MD   Consent:    Consent obtained:  Verbal   Consent given by:  Patient   Risks discussed:  Bleeding, incomplete drainage, pain and damage to other organs   Alternatives discussed:  No treatment, delayed treatment, alternative treatment and observation Location:    Type:  Abscess   Location:  Lower extremity   Lower extremity  location:  Leg   Leg location:  R upper leg Pre-procedure details:    Skin preparation:  Antiseptic wash and Betadine Anesthesia (see MAR for exact dosages):    Anesthesia method:  Local infiltration  Local anesthetic:  Lidocaine 2% w/o epi Procedure type:    Complexity:  Complex Procedure details:    Needle aspiration: no     Incision types:  Single straight (2 cm)   Incision depth:  Dermal   Scalpel blade:  11   Wound management:  Probed and deloculated   Drainage:  Purulent   Drainage amount:  Moderate   Wound treatment:  Wound left open   Packing materials:  1/4 in gauze   Amount 1/4":  6 cm Post-procedure details:    Patient tolerance of procedure:  Tolerated well, no immediate complications   (including critical care time)  Medications Ordered in UC Medications - No data to display  Initial Impression / Assessment and Plan / UC Course  I have reviewed the triage vital signs and the nursing notes.  Pertinent labs & imaging results that were available during my care of the patient were reviewed by me and considered in my medical decision making (see chart for details).    Final Clinical Impressions(s) / UC Diagnoses   Final diagnoses:  Abscess     Discharge Instructions     Please return on Sunday for recheck   ED Prescriptions    Medication Sig Dispense Auth. Provider   doxycycline (VIBRA-TABS) 100 MG tablet Take 1 tablet (100 mg total) by mouth 2 (two) times daily. 20 tablet Robyn Haber, MD     Controlled Substance Prescriptions [place] Controlled Substance Registry consulted? Not Applicable   Robyn Haber, MD 01/14/19 [phone]

## 2019-01-14 NOTE — ED Triage Notes (Signed)
Pt presents with recurrent abscess on back of right leg.

## 2019-01-14 NOTE — Discharge Instructions (Addendum)
Please return on Sunday for recheck

## 2019-01-16 ENCOUNTER — Encounter (HOSPITAL_COMMUNITY): Payer: Self-pay | Admitting: Emergency Medicine

## 2019-01-16 ENCOUNTER — Ambulatory Visit (HOSPITAL_COMMUNITY)
Admission: EM | Admit: 2019-01-16 | Discharge: 2019-01-16 | Disposition: A | Discharge status: Home / Self Care | Payer: Medicare HMO | Attending: Family Medicine | Admitting: Family Medicine

## 2019-01-16 DIAGNOSIS — Z5189 Encounter for other specified aftercare: Secondary | ICD-10-CM

## 2019-01-16 DIAGNOSIS — L02415 Cutaneous abscess of right lower limb: Secondary | ICD-10-CM

## 2019-01-16 LAB — AEROBIC CULTURE W GRAM STAIN (SUPERFICIAL SPECIMEN): Culture: NO GROWTH

## 2019-01-16 NOTE — Discharge Instructions (Addendum)
Packing removed without difficulty Apply warm compresses 3-4x daily for 10-15 minutes Wash site daily with warm water and mild soap Keep covered to avoid friction Continue with antibiotic as prescribed and to completion Follow up here or with PCP if symptoms persists Return or go to the ED if you have any new or worsening symptoms fever, chills, nausea, vomiting, increased redness, swelling, drainage, pain, etc..Marland Kitchen

## 2019-01-16 NOTE — ED Triage Notes (Signed)
Pt states he was here on 2/14 for abscess and given antibiotics and told to return for wound check.

## 2019-01-16 NOTE — ED Provider Notes (Signed)
[place]   [id number] 01/16/19 Arrival Time: 85   CC: Wound check  SUBJECTIVE:  Hunter Pratt is a 63 y.o. male who presents for wound check following I&D to RT posterior medial thigh on 01/14/19.  Reports abscess since July of 2019.  Was started on multiple rounds of antibiotics by PCP without relief.  Was prescribed doxycycline following I&D.  Wound culture did not show bacteria.  Denies fever, chills, nausea, vomiting, erythema, swelling, drainage.    ROS: As per HPI.  Past Medical History:  Diagnosis Date  . Abnormal white blood cell 01/27/2018  . Anemia    past hx   . Arthritis   . Constipation 01/28/2017  . DDD (degenerative disc disease), cervical   . Diffuse abdominal pain 12/30/2018  . Ganglion cyst 01/28/2017  . GERD (gastroesophageal reflux disease)   . Hearing loss    Bilateral  . HIV infection ([place])   . Hyperlipidemia   . Kidney stone   . OA (osteoarthritis) of knee   . PUD (peptic ulcer disease)   . Sleep apnea    dx'd no current cpap use  . Systolic dysfunction    EF 40-45%   Past Surgical History:  Procedure Laterality Date  . CERVICAL DISCECTOMY  1990 and 1991  . COLONOSCOPY    . GANGLION CYST EXCISION Left 03/18/2017   Procedure: REMOVAL GANGLION OF LEFT WRIST, TENOLYSIS;  Surgeon: Leandrew Koyanagi, MD;  Location: [facility];  Service: Orthopedics;  Laterality: Left;  . POLYPECTOMY     Allergies  Allergen Reactions  . Naproxen Sodium     REACTION: kidney failure  . Nsaids     REACTION: GI upset  . Other Rash    Black dye   No current facility-administered medications on file prior to encounter.    Current Outpatient Medications on File Prior to Encounter  Medication Sig Dispense Refill  . BIKTARVY 50-200-25 MG TABS tablet TAKE 1 TABLET BY MOUTH DAILY 30 tablet 2  . doxycycline (VIBRA-TABS) 100 MG tablet Take 1 tablet (100 mg total) by mouth 2 (two) times daily. 20 tablet 0  . fluocinonide ointment (LIDEX) 0.05 % APPLY  EXTERNALLY TO THE AFFECTED AREA TWICE DAILY 30 g 0  . omeprazole (PRILOSEC) 20 MG capsule Take 1 tablet twice a day 60 capsule 3  . traZODone (DESYREL) 50 MG tablet Take 1 tablet (50 mg total) by mouth at bedtime as needed for sleep. 90 tablet 3   Social History   Socioeconomic History  . Marital status: Divorced    Spouse name: Not on file  . Number of children: Not on file  . Years of education: Not on file  . Highest education level: Not on file  Occupational History  . Not on file  Social Needs  . Financial resource strain: Not on file  . Food insecurity:    Worry: Not on file    Inability: Not on file  . Transportation needs:    Medical: Not on file    Non-medical: Not on file  Tobacco Use  . Smoking status: Former Smoker    Types: Cigarettes  . Smokeless tobacco: Never Used  Substance and Sexual Activity  . Alcohol use: No  . Drug use: No  . Sexual activity: Never    Comment: refused condoms  Lifestyle  . Physical activity:    Days per week: Not on file    Minutes per session: Not on file  . Stress: Not on file  Relationships  . Social connections:    Talks on phone: Not on file    Gets together: Not on file    Attends religious service: Not on file    Active member of club or organization: Not on file    Attends meetings of clubs or organizations: Not on file    Relationship status: Not on file  . Intimate partner violence:    Fear of current or ex partner: Not on file    Emotionally abused: Not on file    Physically abused: Not on file    Forced sexual activity: Not on file  Other Topics Concern  . Not on file  Social History Narrative  . Not on file   Family History  Problem Relation Age of Onset  . Hypertension Mother   . Osteoporosis Mother   . Osteoporosis Father   . Depression Father   . Hearing loss Father   . Colon polyps Father   . Osteoporosis Sister   . Arthritis Sister   . Hypertension Brother   . Hyperlipidemia Brother   .  Depression Brother   . Colon cancer Neg Hx   . Rectal cancer Neg Hx   . Stomach cancer Neg Hx   . Esophageal cancer Neg Hx     OBJECTIVE:  Vitals:   01/16/19 1034  BP: (!) 149/85  Pulse: 91  Resp: 18  Temp: 98.1 F (36.7 C)  SpO2: 99%     General appearance: alert; no distress Skin: 1.5 cm wound to RT posterior medial thigh following incision and drainage; no obvious drainage, mild surrounding induration and erythema; packing removed without difficulty (see picture below) Psychological: alert and cooperative; normal mood and affect      ASSESSMENT & PLAN:  1. Wound check, abscess     Packing removed without difficulty Apply warm compresses 3-4x daily for 10-15 minutes Wash site daily with warm water and mild soap Keep covered to avoid friction Continue with antibiotic as prescribed and to completion Follow up here or with PCP if symptoms persists Return or go to the ED if you have any new or worsening symptoms fever, chills, nausea, vomiting, increased redness, swelling, drainage, pain, etc...  Reviewed expectations re: course of current medical issues. Questions answered. Outlined signs and symptoms indicating need for more acute intervention. Patient verbalized understanding. After Visit Summary given.          Lestine Box, PA-C 01/16/19 1601

## 2019-01-16 NOTE — ED Notes (Signed)
Dressing supplies provided with wound care instructions.  Pt verbalized understanding.

## 2019-01-17 ENCOUNTER — Other Ambulatory Visit: Payer: Self-pay | Admitting: Family Medicine

## 2019-02-17 ENCOUNTER — Other Ambulatory Visit: Payer: Self-pay | Admitting: Infectious Disease

## 2019-02-17 DIAGNOSIS — B20 Human immunodeficiency virus [HIV] disease: Secondary | ICD-10-CM

## 2019-05-20 ENCOUNTER — Ambulatory Visit (INDEPENDENT_AMBULATORY_CARE_PROVIDER_SITE_OTHER): Payer: Medicare HMO | Admitting: Family Medicine

## 2019-05-20 ENCOUNTER — Encounter: Payer: Self-pay | Admitting: Family Medicine

## 2019-05-20 ENCOUNTER — Other Ambulatory Visit: Payer: Self-pay

## 2019-05-20 VITALS — BP 122/86 | HR 84 | Temp 98.3°F | Resp 14 | Ht 71.0 in | Wt 231.0 lb

## 2019-05-20 DIAGNOSIS — Z0000 Encounter for general adult medical examination without abnormal findings: Secondary | ICD-10-CM

## 2019-05-20 DIAGNOSIS — D489 Neoplasm of uncertain behavior, unspecified: Secondary | ICD-10-CM | POA: Diagnosis not present

## 2019-05-20 DIAGNOSIS — E782 Mixed hyperlipidemia: Secondary | ICD-10-CM

## 2019-05-20 DIAGNOSIS — E669 Obesity, unspecified: Secondary | ICD-10-CM | POA: Diagnosis not present

## 2019-05-20 DIAGNOSIS — E119 Type 2 diabetes mellitus without complications: Secondary | ICD-10-CM

## 2019-05-20 DIAGNOSIS — Z0001 Encounter for general adult medical examination with abnormal findings: Secondary | ICD-10-CM

## 2019-05-20 DIAGNOSIS — I10 Essential (primary) hypertension: Secondary | ICD-10-CM

## 2019-05-20 DIAGNOSIS — G4700 Insomnia, unspecified: Secondary | ICD-10-CM

## 2019-05-20 NOTE — Patient Instructions (Signed)
We will call with results F/U 4 months  

## 2019-05-20 NOTE — Progress Notes (Signed)
Subjective:   Patient presents for Medicare Annual/Subsequent preventive examination.     He has a spot on right thigh that wont heal for months, it does randomly bleed, cant not remember if it was a bug bite first   Review Past Medical/Family/Social: Per EMR   Risk Factors  Current exercise habits: walks/outdoor work Dietary issues discussed: yes  Cardiac risk factors: Obesity (BMI >= 30 kg/m2). DM  Depression Screen  (Note: if answer to either of the following is "Yes", a more complete depression screening is indicated)  Over the past two weeks, have you felt down, depressed or hopeless? No Over the past two weeks, have you felt little interest or pleasure in doing things? No Have you lost interest or pleasure in daily life? No Do you often feel hopeless? No Do you cry easily over simple problems? No   Activities of Daily Living  In your present state of health, do you have any difficulty performing the following activities?:  Driving? No  Managing money? No  Feeding yourself? No  Getting from bed to chair? No  Climbing a flight of stairs? No  Preparing food and eating?: No  Bathing or showering? No  Getting dressed: No  Getting to the toilet? No  Using the toilet:No  Moving around from place to place: No  In the past year have you fallen or had a near fall?:No  Are you sexually active? No  Do you have more than one partner? No   Hearing Difficulties: Yes hearing aides Do you often ask people to speak up or repeat themselves? yes Do you experience ringing or noises in your ears? No Do you have difficulty understanding soft or whispered voices? Yes  Do you feel that you have a problem with memory? No Do you often misplace items? No  Do you feel safe at home? Yes  Cognitive Testing  Alert? Yes Normal Appearance?Yes  Oriented to person? Yes Place? Yes  Time? Yes  Recall of three objects? Yes  Can perform simple calculations? Yes  Displays appropriate judgment?Yes   Can read the correct time from a watch face?Yes    List the Names of Other Physician/Practitioners you currently use:   ID- Dr. Tommy Medal  , orthopedics as needed   Screening Tests / Date Colonoscopy    UTD          Shingrix - Declines Pneumonia- UTD Influenza Vaccine UTD Tetanus/tdap utd  ROS: GEN- denies fatigue, fever, weight loss,weakness, recent illness HEENT- denies eye drainage, change in vision, nasal discharge, CVS- denies chest pain, palpitations RESP- denies SOB, cough, wheeze ABD- denies N/V, change in stools, abd pain GU- denies dysuria, hematuria, dribbling, incontinence MSK- denies joint pain, muscle aches, injury Neuro- denies headache, dizziness, syncope, seizure activity  Physical: vitals reviewed  GEN- NAD, alert and oriented x3 HEENT- PERRL, EOMI, non injected sclera, pink conjunctiva, MMM, oropharynx clear, TM clear , canals clear, hearing aides bila  Neck- Supple, no thryomegaly, no bruit  CVS- RRR, no murmur RESP-CTAB ABD-NABS,soft, NT, ND  Skin- lateral right thigh, mild erythematous papule, NT, no drainage- size eraser head EXT- No edema Pulses- Radial, DP- 2+  Procedure-Shave biopsy Procedure explained to patient questions answered benefits and risks discussed verbal consent obtained. Antiseptic-betadine Anesthesia-lidocaine 1%with epi Razor used to shave lesion off, placed in specimen container  Minimal blood loss, drysol used  due to persistent oozing Patient tolerated procedure well Bandage applied    Assessment:    Annual wellness medicare exam  Plan:    During the course of the visit the patient was educated and counseled about appropriate screening and preventive services including:   Prevention UTD- declines shingles vaccine  Neoplasm of skin- biopsy done, sent for pathology   DM- recheck A1C, diet controlled , goal less than 7%  HTn well contrlled   continues with ID clinic for his HIV    Chronic insomnia maintained  on trazodone    audit c/fall/drepssion screen negative   given handout on advanced directives    Needs PSA screening done   Diet review for nutrition referral? Yes ____ Not Indicated __x__  Patient Instructions (the written plan) was given to the patient.  Medicare Attestation  I have personally reviewed:  The patient's medical and social history  Their use of alcohol, tobacco or illicit drugs  Their current medications and supplements  The patient's functional ability including ADLs,fall risks, home safety risks, cognitive, and hearing and visual impairment  Diet and physical activities  Evidence for depression or mood disorders  The patient's weight, height, BMI, and visual acuity have been recorded in the chart. I have made referrals, counseling, and provided education to the patient based on review of the above and I have provided the patient with a written personalized care plan for preventive services.

## 2019-05-21 LAB — COMPREHENSIVE METABOLIC PANEL
AG Ratio: 1.3 (calc) (ref 1.0–2.5)
ALT: 12 U/L (ref 9–46)
AST: 16 U/L (ref 10–35)
Albumin: 4.4 g/dL (ref 3.6–5.1)
Alkaline phosphatase (APISO): 101 U/L (ref 35–144)
BUN: 12 mg/dL (ref 7–25)
CO2: 24 mmol/L (ref 20–32)
Calcium: 9.9 mg/dL (ref 8.6–10.3)
Chloride: 100 mmol/L (ref 98–110)
Creat: 1.19 mg/dL (ref 0.70–1.25)
Globulin: 3.5 g/dL (calc) (ref 1.9–3.7)
Glucose, Bld: 100 mg/dL — ABNORMAL HIGH (ref 65–99)
Potassium: 4.9 mmol/L (ref 3.5–5.3)
Sodium: 137 mmol/L (ref 135–146)
Total Bilirubin: 0.4 mg/dL (ref 0.2–1.2)
Total Protein: 7.9 g/dL (ref 6.1–8.1)

## 2019-05-21 LAB — HEMOGLOBIN A1C
Hgb A1c MFr Bld: 6.1 % of total Hgb — ABNORMAL HIGH (ref <5.7)
Mean Plasma Glucose: 128 (calc)
eAG (mmol/L): 7.1 (calc)

## 2019-05-21 LAB — CBC WITH DIFFERENTIAL/PLATELET
Absolute Monocytes: 722 cells/uL (ref 200–950)
Basophils Absolute: 146 cells/uL (ref 0–200)
Basophils Relative: 1.7 %
Eosinophils Absolute: 464 cells/uL (ref 15–500)
Eosinophils Relative: 5.4 %
HCT: 52.5 % — ABNORMAL HIGH (ref 38.5–50.0)
Hemoglobin: 18.3 g/dL — ABNORMAL HIGH (ref 13.2–17.1)
Lymphs Abs: 2735 cells/uL (ref 850–3900)
MCH: 33.5 pg — ABNORMAL HIGH (ref 27.0–33.0)
MCHC: 34.9 g/dL (ref 32.0–36.0)
MCV: 96.2 fL (ref 80.0–100.0)
MPV: 10.2 fL (ref 7.5–12.5)
Monocytes Relative: 8.4 %
Neutro Abs: 4532 cells/uL (ref 1500–7800)
Neutrophils Relative %: 52.7 %
Platelets: 330 10*3/uL (ref 140–400)
RBC: 5.46 10*6/uL (ref 4.20–5.80)
RDW: 12.9 % (ref 11.0–15.0)
Total Lymphocyte: 31.8 %
WBC: 8.6 10*3/uL (ref 3.8–10.8)

## 2019-05-21 LAB — LIPID PANEL
Cholesterol: 181 mg/dL (ref <200)
HDL: 40 mg/dL (ref >=40)
LDL Cholesterol (Calc): 110 mg/dL (calc) — ABNORMAL HIGH
Non-HDL Cholesterol (Calc): 141 mg/dL (calc) — ABNORMAL HIGH (ref <130)
Total CHOL/HDL Ratio: 4.5 (calc) (ref <5.0)
Triglycerides: 192 mg/dL — ABNORMAL HIGH (ref <150)

## 2019-05-22 ENCOUNTER — Encounter: Payer: Self-pay | Admitting: Family Medicine

## 2019-05-22 DIAGNOSIS — E66811 Obesity, class 1: Secondary | ICD-10-CM | POA: Insufficient documentation

## 2019-05-22 DIAGNOSIS — E669 Obesity, unspecified: Secondary | ICD-10-CM | POA: Insufficient documentation

## 2019-05-24 LAB — TISSUE SPECIMEN

## 2019-05-24 LAB — PATHOLOGY REPORT

## 2019-06-16 ENCOUNTER — Ambulatory Visit: Payer: Medicare HMO

## 2019-06-16 ENCOUNTER — Other Ambulatory Visit: Payer: Self-pay

## 2019-06-16 ENCOUNTER — Other Ambulatory Visit: Payer: Medicare HMO

## 2019-06-16 DIAGNOSIS — B20 Human immunodeficiency virus [HIV] disease: Secondary | ICD-10-CM

## 2019-06-16 DIAGNOSIS — Z79899 Other long term (current) drug therapy: Secondary | ICD-10-CM | POA: Diagnosis not present

## 2019-06-16 DIAGNOSIS — R69 Illness, unspecified: Secondary | ICD-10-CM | POA: Diagnosis not present

## 2019-06-17 LAB — T-HELPER CELL (CD4) - (RCID CLINIC ONLY)
CD4 % Helper T Cell: 30 % — ABNORMAL LOW (ref 33–65)
CD4 T Cell Abs: 614 /uL (ref 400–1790)

## 2019-06-22 LAB — COMPLETE METABOLIC PANEL WITH GFR
AG Ratio: 1.2 (calc) (ref 1.0–2.5)
ALT: 13 U/L (ref 9–46)
AST: 16 U/L (ref 10–35)
Albumin: 4.3 g/dL (ref 3.6–5.1)
Alkaline phosphatase (APISO): 97 U/L (ref 35–144)
BUN: 13 mg/dL (ref 7–25)
CO2: 24 mmol/L (ref 20–32)
Calcium: 10.1 mg/dL (ref 8.6–10.3)
Chloride: 101 mmol/L (ref 98–110)
Creat: 1.12 mg/dL (ref 0.70–1.25)
GFR, Est African American: 82 mL/min/{1.73_m2} (ref >=60)
GFR, Est Non African American: 71 mL/min/{1.73_m2} (ref >=60)
Globulin: 3.5 g/dL (calc) (ref 1.9–3.7)
Glucose, Bld: 110 mg/dL — ABNORMAL HIGH (ref 65–99)
Potassium: 4.5 mmol/L (ref 3.5–5.3)
Sodium: 137 mmol/L (ref 135–146)
Total Bilirubin: 0.5 mg/dL (ref 0.2–1.2)
Total Protein: 7.8 g/dL (ref 6.1–8.1)

## 2019-06-22 LAB — LIPID PANEL
Cholesterol: 192 mg/dL (ref <200)
HDL: 40 mg/dL (ref >=40)
LDL Cholesterol (Calc): 120 mg/dL (calc) — ABNORMAL HIGH
Non-HDL Cholesterol (Calc): 152 mg/dL (calc) — ABNORMAL HIGH (ref <130)
Total CHOL/HDL Ratio: 4.8 (calc) (ref <5.0)
Triglycerides: 199 mg/dL — ABNORMAL HIGH (ref <150)

## 2019-06-22 LAB — CBC WITH DIFFERENTIAL/PLATELET
Absolute Monocytes: 806 cells/uL (ref 200–950)
Basophils Absolute: 134 cells/uL (ref 0–200)
Basophils Relative: 1.4 %
Eosinophils Absolute: 374 cells/uL (ref 15–500)
Eosinophils Relative: 3.9 %
HCT: 52.3 % — ABNORMAL HIGH (ref 38.5–50.0)
Hemoglobin: 18.2 g/dL — ABNORMAL HIGH (ref 13.2–17.1)
Lymphs Abs: 2141 cells/uL (ref 850–3900)
MCH: 33 pg (ref 27.0–33.0)
MCHC: 34.8 g/dL (ref 32.0–36.0)
MCV: 94.7 fL (ref 80.0–100.0)
MPV: 9.7 fL (ref 7.5–12.5)
Monocytes Relative: 8.4 %
Neutro Abs: 6144 cells/uL (ref 1500–7800)
Neutrophils Relative %: 64 %
Platelets: 335 10*3/uL (ref 140–400)
RBC: 5.52 10*6/uL (ref 4.20–5.80)
RDW: 12.4 % (ref 11.0–15.0)
Total Lymphocyte: 22.3 %
WBC: 9.6 10*3/uL (ref 3.8–10.8)

## 2019-06-22 LAB — RPR: RPR Ser Ql: NONREACTIVE

## 2019-06-22 LAB — HIV-1 RNA QUANT-NO REFLEX-BLD
HIV 1 RNA Quant: <20 copies/mL
HIV-1 RNA Quant, Log: <1.3 Log copies/mL

## 2019-06-30 ENCOUNTER — Encounter: Payer: Self-pay | Admitting: Infectious Disease

## 2019-06-30 ENCOUNTER — Other Ambulatory Visit: Payer: Self-pay

## 2019-06-30 ENCOUNTER — Ambulatory Visit: Payer: Medicare HMO | Admitting: Infectious Disease

## 2019-06-30 VITALS — BP 136/91 | HR 90 | Temp 97.9°F

## 2019-06-30 DIAGNOSIS — Z6838 Body mass index (BMI) 38.0-38.9, adult: Secondary | ICD-10-CM | POA: Diagnosis not present

## 2019-06-30 DIAGNOSIS — R69 Illness, unspecified: Secondary | ICD-10-CM | POA: Diagnosis not present

## 2019-06-30 DIAGNOSIS — D751 Secondary polycythemia: Secondary | ICD-10-CM | POA: Diagnosis not present

## 2019-06-30 DIAGNOSIS — B20 Human immunodeficiency virus [HIV] disease: Secondary | ICD-10-CM

## 2019-06-30 DIAGNOSIS — E119 Type 2 diabetes mellitus without complications: Secondary | ICD-10-CM | POA: Diagnosis not present

## 2019-06-30 DIAGNOSIS — I10 Essential (primary) hypertension: Secondary | ICD-10-CM

## 2019-06-30 DIAGNOSIS — Z113 Encounter for screening for infections with a predominantly sexual mode of transmission: Secondary | ICD-10-CM | POA: Diagnosis not present

## 2019-06-30 DIAGNOSIS — G4733 Obstructive sleep apnea (adult) (pediatric): Secondary | ICD-10-CM | POA: Diagnosis not present

## 2019-06-30 DIAGNOSIS — E669 Obesity, unspecified: Secondary | ICD-10-CM

## 2019-06-30 HISTORY — DX: Obesity, unspecified: E66.9

## 2019-06-30 NOTE — Progress Notes (Signed)
Chief complaint: No acute complaints  Subjective:    Patient ID: Hunter Pratt, adult    DOB: 1956/07/25, 63 y.o.   MRN: [id number]  HPI  63  Year old male who is doing superbly well on his antiviral regimen, of intelence, Isentress both QDaily and Descovy --> BIKTARVY\  He has done well and maintained perfect virological suppression.   Is doing very well and has been sheltering in place at home.  He has no complaints today.   Past Medical History:  Diagnosis Date  . Abnormal white blood cell 01/27/2018  . Anemia    past hx   . Arthritis   . Constipation 01/28/2017  . DDD (degenerative disc disease), cervical   . Diffuse abdominal pain 12/30/2018  . Ganglion cyst 01/28/2017  . GERD (gastroesophageal reflux disease)   . Hearing loss    Bilateral  . HIV infection ([place])   . Hyperlipidemia   . Kidney stone   . OA (osteoarthritis) of knee   . PUD (peptic ulcer disease)   . Sleep apnea    dx'd no current cpap use  . Systolic dysfunction    EF 40-45%    Past Surgical History:  Procedure Laterality Date  . CERVICAL DISCECTOMY  1990 and 1991  . COLONOSCOPY    . GANGLION CYST EXCISION Left 03/18/2017   Procedure: REMOVAL GANGLION OF LEFT WRIST, TENOLYSIS;  Surgeon: Leandrew Koyanagi, MD;  Location: [facility];  Service: Orthopedics;  Laterality: Left;  . POLYPECTOMY      Family History  Problem Relation Age of Onset  . Hypertension Mother   . Osteoporosis Mother   . Osteoporosis Father   . Depression Father   . Hearing loss Father   . Colon polyps Father   . Osteoporosis Sister   . Arthritis Sister   . Hypertension Brother   . Hyperlipidemia Brother   . Depression Brother   . Colon cancer Neg Hx   . Rectal cancer Neg Hx   . Stomach cancer Neg Hx   . Esophageal cancer Neg Hx       Social History   Socioeconomic History  . Marital status: Divorced    Spouse name: Not on file  . Number of children: Not on file  . Years of education: Not on file   . Highest education level: Not on file  Occupational History  . Not on file  Social Needs  . Financial resource strain: Not on file  . Food insecurity    Worry: Not on file    Inability: Not on file  . Transportation needs    Medical: Not on file    Non-medical: Not on file  Tobacco Use  . Smoking status: Former Smoker    Types: Cigarettes  . Smokeless tobacco: Never Used  Substance and Sexual Activity  . Alcohol use: No  . Drug use: No  . Sexual activity: Never    Comment: refused condoms  Lifestyle  . Physical activity    Days per week: Not on file    Minutes per session: Not on file  . Stress: Not on file  Relationships  . Social Herbalist on phone: Not on file    Gets together: Not on file    Attends religious service: Not on file    Active member of club or organization: Not on file    Attends meetings of clubs or organizations: Not on file    Relationship status:  Not on file  Other Topics Concern  . Not on file  Social History Narrative  . Not on file    Allergies  Allergen Reactions  . Naproxen Sodium     REACTION: kidney failure  . Nsaids     REACTION: GI upset  . Other Rash    Black dye     Current Outpatient Medications:  .  BIKTARVY 50-200-25 MG TABS tablet, TAKE 1 TABLET BY MOUTH DAILY, Disp: 30 tablet, Rfl: 5 .  fluocinonide ointment (LIDEX) 0.05 %, APPLY EXTERNALLY TO THE AFFECTED AREA TWICE DAILY, Disp: 30 g, Rfl: 0 .  omeprazole (PRILOSEC) 20 MG capsule, Take 1 tablet twice a day, Disp: 60 capsule, Rfl: 3 .  traZODone (DESYREL) 50 MG tablet, TAKE 1 TABLET(50 MG) BY MOUTH AT BEDTIME AS NEEDED FOR SLEEP, Disp: 90 tablet, Rfl: 3  Review of Systems  Constitutional: Negative for chills and fever.  HENT: Negative for congestion and sore throat.   Eyes: Negative for photophobia.  Respiratory: Negative for cough, shortness of breath and wheezing.   Cardiovascular: Negative for chest pain, palpitations and leg swelling.   Gastrointestinal: Negative for abdominal pain, blood in stool, constipation, diarrhea, nausea and vomiting.  Genitourinary: Negative for dysuria, flank pain and hematuria.  Musculoskeletal: Negative for back pain and myalgias.  Skin: Negative for rash.  Neurological: Negative for dizziness, weakness and headaches.  Hematological: Does not bruise/bleed easily.  Psychiatric/Behavioral: Negative for agitation, confusion, decreased concentration, dysphoric mood, hallucinations and suicidal ideas.       Objective:   Physical Exam  Constitutional: He is oriented to person, place, and time. He appears well-developed and well-nourished. No distress.  HENT:  Head: Normocephalic and atraumatic.  Mouth/Throat: No oropharyngeal exudate.  Eyes: Conjunctivae and EOM are normal. No scleral icterus.  Neck: Normal range of motion. Neck supple.  Cardiovascular: Normal rate, regular rhythm and normal heart sounds.  Pulmonary/Chest: Effort normal and breath sounds normal. He has no wheezes. He has no rales.  Abdominal: Soft. Bowel sounds are normal. He exhibits no distension.  Musculoskeletal:        General: No tenderness or edema.  Neurological: He is alert and oriented to person, place, and time. He exhibits normal muscle tone. Coordination normal.  Skin: Skin is warm and dry. No rash noted. He is not diaphoretic. No erythema. No pallor.  Psychiatric: He has a normal mood and affect. His behavior is normal. Judgment and thought content normal.  Nursing note and vitals reviewed.  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  Hopefully this time will get scheduled although the coronavirus 3235 could certainly complicate things.  Obesity weight loss would be very helpful for his obstructive sleep apnea his diabetes and also reducing his risk for novel coronavirus 2019

## 2019-07-25 ENCOUNTER — Other Ambulatory Visit: Payer: Self-pay | Admitting: Infectious Disease

## 2019-07-25 DIAGNOSIS — B20 Human immunodeficiency virus [HIV] disease: Secondary | ICD-10-CM

## 2019-08-01 ENCOUNTER — Other Ambulatory Visit (HOSPITAL_BASED_OUTPATIENT_CLINIC_OR_DEPARTMENT_OTHER): Payer: Self-pay

## 2019-08-01 DIAGNOSIS — D751 Secondary polycythemia: Secondary | ICD-10-CM

## 2019-08-01 DIAGNOSIS — G4733 Obstructive sleep apnea (adult) (pediatric): Secondary | ICD-10-CM

## 2019-08-31 ENCOUNTER — Other Ambulatory Visit: Payer: Self-pay | Admitting: Family Medicine

## 2019-09-06 DIAGNOSIS — R69 Illness, unspecified: Secondary | ICD-10-CM | POA: Diagnosis not present

## 2019-11-15 ENCOUNTER — Other Ambulatory Visit: Payer: Self-pay

## 2019-11-15 ENCOUNTER — Ambulatory Visit: Payer: Medicare HMO | Attending: Internal Medicine

## 2019-11-15 DIAGNOSIS — Z20822 Contact with and (suspected) exposure to covid-19: Secondary | ICD-10-CM

## 2019-11-15 DIAGNOSIS — Z20828 Contact with and (suspected) exposure to other viral communicable diseases: Secondary | ICD-10-CM | POA: Diagnosis not present

## 2019-11-16 LAB — NOVEL CORONAVIRUS, NAA: SARS-CoV-2, NAA: NOT DETECTED

## 2019-11-17 DIAGNOSIS — Z7722 Contact with and (suspected) exposure to environmental tobacco smoke (acute) (chronic): Secondary | ICD-10-CM | POA: Diagnosis not present

## 2019-11-17 DIAGNOSIS — E669 Obesity, unspecified: Secondary | ICD-10-CM | POA: Diagnosis not present

## 2019-11-17 DIAGNOSIS — Z20828 Contact with and (suspected) exposure to other viral communicable diseases: Secondary | ICD-10-CM | POA: Diagnosis not present

## 2019-11-17 DIAGNOSIS — K5900 Constipation, unspecified: Secondary | ICD-10-CM | POA: Diagnosis not present

## 2019-11-17 DIAGNOSIS — K219 Gastro-esophageal reflux disease without esophagitis: Secondary | ICD-10-CM | POA: Diagnosis not present

## 2019-11-17 DIAGNOSIS — G4700 Insomnia, unspecified: Secondary | ICD-10-CM | POA: Diagnosis not present

## 2019-11-17 DIAGNOSIS — E1140 Type 2 diabetes mellitus with diabetic neuropathy, unspecified: Secondary | ICD-10-CM | POA: Diagnosis not present

## 2019-11-17 DIAGNOSIS — E11620 Type 2 diabetes mellitus with diabetic dermatitis: Secondary | ICD-10-CM | POA: Diagnosis not present

## 2019-11-17 DIAGNOSIS — R69 Illness, unspecified: Secondary | ICD-10-CM | POA: Diagnosis not present

## 2019-12-14 NOTE — Addendum Note (Signed)
Addended by: Dolan Amen D on: 12/14/2019 11:15 AM   Modules accepted: Orders

## 2019-12-14 NOTE — Addendum Note (Signed)
Addended by: Dolan Amen D on: 12/14/2019 11:14 AM   Modules accepted: Orders

## 2019-12-14 NOTE — Addendum Note (Signed)
Addended by: Dolan Amen D on: 12/14/2019 11:16 AM   Modules accepted: Orders

## 2019-12-15 ENCOUNTER — Other Ambulatory Visit: Payer: Medicare HMO

## 2019-12-15 ENCOUNTER — Other Ambulatory Visit: Payer: Self-pay

## 2019-12-15 DIAGNOSIS — G4733 Obstructive sleep apnea (adult) (pediatric): Secondary | ICD-10-CM

## 2019-12-15 DIAGNOSIS — Z113 Encounter for screening for infections with a predominantly sexual mode of transmission: Secondary | ICD-10-CM | POA: Diagnosis not present

## 2019-12-15 DIAGNOSIS — E119 Type 2 diabetes mellitus without complications: Secondary | ICD-10-CM

## 2019-12-15 DIAGNOSIS — B20 Human immunodeficiency virus [HIV] disease: Secondary | ICD-10-CM

## 2019-12-15 DIAGNOSIS — I10 Essential (primary) hypertension: Secondary | ICD-10-CM | POA: Diagnosis not present

## 2019-12-15 DIAGNOSIS — D751 Secondary polycythemia: Secondary | ICD-10-CM

## 2019-12-15 DIAGNOSIS — R69 Illness, unspecified: Secondary | ICD-10-CM | POA: Diagnosis not present

## 2019-12-16 LAB — T-HELPER CELL (CD4) - (RCID CLINIC ONLY)
CD4 % Helper T Cell: 30 % — ABNORMAL LOW (ref 33–65)
CD4 T Cell Abs: 713 /uL (ref 400–1790)

## 2019-12-21 LAB — CBC WITH DIFFERENTIAL/PLATELET
Absolute Monocytes: 819 cells/uL (ref 200–950)
Basophils Absolute: 187 cells/uL (ref 0–200)
Basophils Relative: 2.1 %
Eosinophils Absolute: 409 cells/uL (ref 15–500)
Eosinophils Relative: 4.6 %
HCT: 53.8 % — ABNORMAL HIGH (ref 38.5–50.0)
Hemoglobin: 18.7 g/dL — ABNORMAL HIGH (ref 13.2–17.1)
Lymphs Abs: 2492 cells/uL (ref 850–3900)
MCH: 32.5 pg (ref 27.0–33.0)
MCHC: 34.8 g/dL (ref 32.0–36.0)
MCV: 93.6 fL (ref 80.0–100.0)
MPV: 9.7 fL (ref 7.5–12.5)
Monocytes Relative: 9.2 %
Neutro Abs: 4993 cells/uL (ref 1500–7800)
Neutrophils Relative %: 56.1 %
Platelets: 284 10*3/uL (ref 140–400)
RBC: 5.75 10*6/uL (ref 4.20–5.80)
RDW: 13 % (ref 11.0–15.0)
Total Lymphocyte: 28 %
WBC: 8.9 10*3/uL (ref 3.8–10.8)

## 2019-12-21 LAB — HIV-1 RNA QUANT-NO REFLEX-BLD
HIV 1 RNA Quant: <20 copies/mL — AB
HIV-1 RNA Quant, Log: <1.3 Log copies/mL — AB

## 2019-12-21 LAB — COMPLETE METABOLIC PANEL WITH GFR
AG Ratio: 1.2 (calc) (ref 1.0–2.5)
ALT: 12 U/L (ref 9–46)
AST: 14 U/L (ref 10–35)
Albumin: 4.1 g/dL (ref 3.6–5.1)
Alkaline phosphatase (APISO): 98 U/L (ref 35–144)
BUN: 15 mg/dL (ref 7–25)
CO2: 26 mmol/L (ref 20–32)
Calcium: 10 mg/dL (ref 8.6–10.3)
Chloride: 103 mmol/L (ref 98–110)
Creat: 1.18 mg/dL (ref 0.70–1.25)
GFR, Est African American: 76 mL/min/{1.73_m2} (ref >=60)
GFR, Est Non African American: 66 mL/min/{1.73_m2} (ref >=60)
Globulin: 3.3 g/dL (calc) (ref 1.9–3.7)
Glucose, Bld: 108 mg/dL — ABNORMAL HIGH (ref 65–99)
Potassium: 4.4 mmol/L (ref 3.5–5.3)
Sodium: 137 mmol/L (ref 135–146)
Total Bilirubin: 0.5 mg/dL (ref 0.2–1.2)
Total Protein: 7.4 g/dL (ref 6.1–8.1)

## 2019-12-21 LAB — LIPID PANEL
Cholesterol: 197 mg/dL (ref <200)
HDL: 38 mg/dL — ABNORMAL LOW (ref >=40)
LDL Cholesterol (Calc): 123 mg/dL (calc) — ABNORMAL HIGH
Non-HDL Cholesterol (Calc): 159 mg/dL (calc) — ABNORMAL HIGH (ref <130)
Total CHOL/HDL Ratio: 5.2 (calc) — ABNORMAL HIGH (ref <5.0)
Triglycerides: 237 mg/dL — ABNORMAL HIGH (ref <150)

## 2019-12-21 LAB — RPR: RPR Ser Ql: NONREACTIVE

## 2019-12-25 ENCOUNTER — Other Ambulatory Visit: Payer: Self-pay | Admitting: Family Medicine

## 2019-12-28 ENCOUNTER — Ambulatory Visit: Payer: Medicare HMO | Admitting: Infectious Disease

## 2019-12-28 ENCOUNTER — Encounter: Payer: Self-pay | Admitting: Infectious Disease

## 2019-12-28 ENCOUNTER — Ambulatory Visit: Payer: Medicare HMO

## 2019-12-28 ENCOUNTER — Other Ambulatory Visit: Payer: Self-pay

## 2019-12-28 VITALS — BP 147/89 | HR 85 | Temp 85.0°F | Ht 71.0 in | Wt 217.0 lb

## 2019-12-28 DIAGNOSIS — R69 Illness, unspecified: Secondary | ICD-10-CM | POA: Diagnosis not present

## 2019-12-28 DIAGNOSIS — Z6838 Body mass index (BMI) 38.0-38.9, adult: Secondary | ICD-10-CM

## 2019-12-28 DIAGNOSIS — E782 Mixed hyperlipidemia: Secondary | ICD-10-CM | POA: Diagnosis not present

## 2019-12-28 DIAGNOSIS — B20 Human immunodeficiency virus [HIV] disease: Secondary | ICD-10-CM

## 2019-12-28 DIAGNOSIS — I10 Essential (primary) hypertension: Secondary | ICD-10-CM | POA: Diagnosis not present

## 2019-12-28 DIAGNOSIS — G4733 Obstructive sleep apnea (adult) (pediatric): Secondary | ICD-10-CM | POA: Diagnosis not present

## 2019-12-28 DIAGNOSIS — R1032 Left lower quadrant pain: Secondary | ICD-10-CM

## 2019-12-28 HISTORY — DX: Left lower quadrant pain: R10.32

## 2019-12-28 LAB — COMPLETE METABOLIC PANEL WITH GFR
AG Ratio: 1.3 (calc) (ref 1.0–2.5)
ALT: 7 U/L — ABNORMAL LOW (ref 9–46)
AST: 10 U/L (ref 10–35)
Albumin: 4.3 g/dL (ref 3.6–5.1)
Alkaline phosphatase (APISO): 107 U/L (ref 35–144)
BUN: 15 mg/dL (ref 7–25)
CO2: 25 mmol/L (ref 20–32)
Calcium: 9.8 mg/dL (ref 8.6–10.3)
Chloride: 102 mmol/L (ref 98–110)
Creat: 1.07 mg/dL (ref 0.70–1.25)
GFR, Est African American: 86 mL/min/{1.73_m2} (ref >=60)
GFR, Est Non African American: 74 mL/min/{1.73_m2} (ref >=60)
Globulin: 3.2 g/dL (calc) (ref 1.9–3.7)
Glucose, Bld: 104 mg/dL — ABNORMAL HIGH (ref 65–99)
Potassium: 4.4 mmol/L (ref 3.5–5.3)
Sodium: 136 mmol/L (ref 135–146)
Total Bilirubin: 0.5 mg/dL (ref 0.2–1.2)
Total Protein: 7.5 g/dL (ref 6.1–8.1)

## 2019-12-28 LAB — CBC WITH DIFFERENTIAL/PLATELET
Absolute Monocytes: 1362 cells/uL — ABNORMAL HIGH (ref 200–950)
Basophils Absolute: 144 cells/uL (ref 0–200)
Basophils Relative: 1.1 %
Eosinophils Absolute: 341 cells/uL (ref 15–500)
Eosinophils Relative: 2.6 %
HCT: 53.1 % — ABNORMAL HIGH (ref 38.5–50.0)
Hemoglobin: 18.5 g/dL — ABNORMAL HIGH (ref 13.2–17.1)
Lymphs Abs: 2594 cells/uL (ref 850–3900)
MCH: 32.7 pg (ref 27.0–33.0)
MCHC: 34.8 g/dL (ref 32.0–36.0)
MCV: 94 fL (ref 80.0–100.0)
MPV: 9.8 fL (ref 7.5–12.5)
Monocytes Relative: 10.4 %
Neutro Abs: 8659 cells/uL — ABNORMAL HIGH (ref 1500–7800)
Neutrophils Relative %: 66.1 %
Platelets: 370 10*3/uL (ref 140–400)
RBC: 5.65 10*6/uL (ref 4.20–5.80)
RDW: 12.6 % (ref 11.0–15.0)
Total Lymphocyte: 19.8 %
WBC: 13.1 10*3/uL — ABNORMAL HIGH (ref 3.8–10.8)

## 2019-12-28 LAB — AMYLASE: Amylase: 53 U/L (ref 21–101)

## 2019-12-28 LAB — LIPASE: Lipase: 23 U/L (ref 7–60)

## 2019-12-28 NOTE — Progress Notes (Signed)
Chief complaint: Complaining of left-sided lower quadrant pain  Subjective:    Patient ID: Hunter Pratt, adult    DOB: 03/27/1956, 64 y.o.   MRN: [id number]  HPI  64  Year old male who is doing superbly well on his antiviral regimen, of intelence, Isentress both QDaily and Descovy --> BIKTARVY\  He has done well and maintained perfect virological suppression.  Jovel is had pain in his left upper and lower quadrant for the last 5 days.  He does not have fevers or chills nausea or vomiting pain is made worse by deep breathing and when he lies on his right side.  It is not made worse by walking.  He rates it as a 5 out of 10 in severity and is sharp at times.  He is concerned that might be diverticulitis.  He does not drink alcohol he has not had gallstones in the past but he has family members who have.    Past Medical History:  Diagnosis Date  . Abnormal white blood cell 01/27/2018  . Anemia    past hx   . Arthritis   . Constipation 01/28/2017  . DDD (degenerative disc disease), cervical   . Diffuse abdominal pain 12/30/2018  . Ganglion cyst 01/28/2017  . GERD (gastroesophageal reflux disease)   . Hearing loss    Bilateral  . HIV infection ([place])   . Hyperlipidemia   . Kidney stone   . OA (osteoarthritis) of knee   . Obesity 06/30/2019  . PUD (peptic ulcer disease)   . Sleep apnea    dx'd no current cpap use  . Systolic dysfunction    EF 40-45%    Past Surgical History:  Procedure Laterality Date  . CERVICAL DISCECTOMY  1990 and 1991  . COLONOSCOPY    . GANGLION CYST EXCISION Left 03/18/2017   Procedure: REMOVAL GANGLION OF LEFT WRIST, TENOLYSIS;  Surgeon: Leandrew Koyanagi, MD;  Location: [facility];  Service: Orthopedics;  Laterality: Left;  . POLYPECTOMY      Family History  Problem Relation Age of Onset  . Hypertension Mother   . Osteoporosis Mother   . Osteoporosis Father   . Depression Father   . Hearing loss Father   . Colon polyps Father   .  Osteoporosis Sister   . Arthritis Sister   . Hypertension Brother   . Hyperlipidemia Brother   . Depression Brother   . Colon cancer Neg Hx   . Rectal cancer Neg Hx   . Stomach cancer Neg Hx   . Esophageal cancer Neg Hx       Social History   Socioeconomic History  . Marital status: Divorced    Spouse name: Not on file  . Number of children: Not on file  . Years of education: Not on file  . Highest education level: Not on file  Occupational History  . Not on file  Tobacco Use  . Smoking status: Former Smoker    Types: Cigarettes  . Smokeless tobacco: Never Used  Substance and Sexual Activity  . Alcohol use: No  . Drug use: No  . Sexual activity: Never    Comment: refused condoms  Other Topics Concern  . Not on file  Social History Narrative  . Not on file   Social Determinants of Health   Financial Resource Strain:   . Difficulty of Paying Living Expenses: Not on file  Food Insecurity:   . Worried About Charity fundraiser in  the Last Year: Not on file  . Ran Out of Food in the Last Year: Not on file  Transportation Needs:   . Lack of Transportation (Medical): Not on file  . Lack of Transportation (Non-Medical): Not on file  Physical Activity:   . Days of Exercise per Week: Not on file  . Minutes of Exercise per Session: Not on file  Stress:   . Feeling of Stress : Not on file  Social Connections:   . Frequency of Communication with Friends and Family: Not on file  . Frequency of Social Gatherings with Friends and Family: Not on file  . Attends Religious Services: Not on file  . Active Member of Clubs or Organizations: Not on file  . Attends Archivist Meetings: Not on file  . Marital Status: Not on file    Allergies  Allergen Reactions  . Naproxen Sodium     REACTION: kidney failure  . Nsaids     REACTION: GI upset  . Other Rash    Black dye     Current Outpatient Medications:  .  BIKTARVY 50-200-25 MG TABS tablet, TAKE 1 TABLET BY  MOUTH DAILY, Disp: 30 tablet, Rfl: 5 .  fluocinonide ointment (LIDEX) 0.05 %, APPLY EXTERNALLY TO THE AFFECTED AREA TWICE DAILY, Disp: 30 g, Rfl: 0 .  omeprazole (PRILOSEC) 20 MG capsule, TAKE 1 CAPSULE BY MOUTH TWICE DAILY, Disp: 60 capsule, Rfl: 3 .  traZODone (DESYREL) 50 MG tablet, TAKE 1 TABLET(50 MG) BY MOUTH AT BEDTIME AS NEEDED FOR SLEEP, Disp: 90 tablet, Rfl: 3  Review of Systems  Constitutional: Negative for chills and fever.  HENT: Negative for congestion and sore throat.   Eyes: Negative for photophobia.  Respiratory: Negative for cough, shortness of breath and wheezing.   Cardiovascular: Negative for chest pain, palpitations and leg swelling.  Gastrointestinal: Positive for abdominal pain and constipation. Negative for blood in stool, diarrhea, nausea, rectal pain and vomiting.  Genitourinary: Negative for dysuria, flank pain and hematuria.  Musculoskeletal: Negative for back pain and myalgias.  Skin: Negative for rash.  Neurological: Negative for dizziness, weakness and headaches.  Hematological: Does not bruise/bleed easily.  Psychiatric/Behavioral: Negative for agitation, confusion, decreased concentration, dysphoric mood, hallucinations and suicidal ideas.       Objective:   Physical Exam  Constitutional: He is oriented to person, place, and time. He appears well-developed and well-nourished. No distress.  HENT:  Head: Normocephalic and atraumatic.  Mouth/Throat: No oropharyngeal exudate.  Eyes: Conjunctivae and EOM are normal. No scleral icterus.  Cardiovascular: Normal rate, regular rhythm and normal heart sounds.  Pulmonary/Chest: Effort normal and breath sounds normal. He has no wheezes. He has no rales.  Abdominal: Soft. Bowel sounds are normal. He exhibits no distension. There is abdominal tenderness in the left upper quadrant and left lower quadrant. There is no rigidity, no rebound and no guarding.  Musculoskeletal:        General: No tenderness or edema.      Cervical back: Normal range of motion and neck supple.  Neurological: He is alert and oriented to person, place, and time. He exhibits normal muscle tone. Coordination normal.  Skin: Skin is warm and dry. No rash noted. He is not diaphoretic. No erythema. No pallor.  Psychiatric: He has a normal mood and affect. His behavior is normal. Judgment and thought content normal.  Nursing note and vitals reviewed.   Assessment & Plan:   Left upper and lower quadrant pain: I doubt that he has  diverticulitis and that he has no fevers but I will check a CBC and a CMP.  Also check lipase and amylase in case this is a gallstone pancreatitis flare.  If pain does not improve may need to get a CT scanvs having go to ER  HIV: continue  BIKTARVY, return to clinic in 6 months t in July for renewal of his SPAP  HTN well-controlled There were no vitals filed for this visit.   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

## 2019-12-30 ENCOUNTER — Other Ambulatory Visit: Payer: Self-pay | Admitting: Infectious Disease

## 2019-12-30 DIAGNOSIS — B20 Human immunodeficiency virus [HIV] disease: Secondary | ICD-10-CM

## 2020-01-04 ENCOUNTER — Ambulatory Visit (INDEPENDENT_AMBULATORY_CARE_PROVIDER_SITE_OTHER): Payer: Medicare HMO | Admitting: Family Medicine

## 2020-01-04 ENCOUNTER — Other Ambulatory Visit: Payer: Self-pay

## 2020-01-04 ENCOUNTER — Encounter: Payer: Self-pay | Admitting: Family Medicine

## 2020-01-04 VITALS — BP 138/82 | HR 78 | Temp 98.2°F | Resp 14 | Ht 71.0 in | Wt 217.0 lb

## 2020-01-04 DIAGNOSIS — R1310 Dysphagia, unspecified: Secondary | ICD-10-CM | POA: Diagnosis not present

## 2020-01-04 DIAGNOSIS — K5792 Diverticulitis of intestine, part unspecified, without perforation or abscess without bleeding: Secondary | ICD-10-CM

## 2020-01-04 DIAGNOSIS — E119 Type 2 diabetes mellitus without complications: Secondary | ICD-10-CM | POA: Diagnosis not present

## 2020-01-04 DIAGNOSIS — I10 Essential (primary) hypertension: Secondary | ICD-10-CM

## 2020-01-04 DIAGNOSIS — K219 Gastro-esophageal reflux disease without esophagitis: Secondary | ICD-10-CM

## 2020-01-04 DIAGNOSIS — R252 Cramp and spasm: Secondary | ICD-10-CM | POA: Diagnosis not present

## 2020-01-04 DIAGNOSIS — E669 Obesity, unspecified: Secondary | ICD-10-CM

## 2020-01-04 MED ORDER — FLUOCINONIDE 0.05 % EX OINT: TOPICAL_OINTMENT | CUTANEOUS | 1 refills | Status: AC

## 2020-01-04 MED ORDER — METRONIDAZOLE 500 MG PO TABS: 500.0000 mg | ORAL_TABLET | Freq: Three times a day (TID) | ORAL | 0 refills | Status: DC

## 2020-01-04 MED ORDER — CIPROFLOXACIN HCL 500 MG PO TABS: 500.0000 mg | ORAL_TABLET | Freq: Two times a day (BID) | ORAL | 0 refills | Status: DC

## 2020-01-04 NOTE — Assessment & Plan Note (Signed)
Blood pressure looks okay in the office.  We will have him monitor his blood pressure at home.  His blood pressure is standing more than 140/90 at home we will restart his medication.

## 2020-01-04 NOTE — Progress Notes (Signed)
Subjective:    Patient ID: Hunter Pratt, adult    DOB: [birth year], 64 y.o.   MRN: [id number]  Patient presents for Follow-up (is fasting- needs labs), L Sided Abd Pain (x12 days- possible constipation/ dierticulitis), and Referral (GI- swallowing difficulty- [place]) Patient here to follow-up chronic medical problems.  He is also here with left lower quadrant abdominal pain.  He did have elevated white blood cell count a week ago at 13.1 amylase and lipase were negative having some difficulties with his bowels as well.  Sometimes he had not have a bowel movement at all but felt a lot of gas.  He did take over-the-counter laxative which helped him have a bowel movement and there was some improvement in the discomfort.  He still has some pain on the left lower quadrant.  He was not prescribed any medications by infectious disease. He denies any urinary symptoms.  Diabetes mellitus last A1c 8.1% in January 2023 diet controlled.  Hyperlipidemia recent lipid panel performed by his infectious disease doctor.  His LDL had increased currently at 123.  Triglycerides were high at 237 He is now on fish oil 2000mg  BID, red yeast rice   Polycythemia his hemoglobin has been 18 the past few checks.  If concern for sleep apnea he has been referred to sleep specialist he has appointment coming up.  HTN-blood pressure was mildly elevated at his last infectious disease appointment.  He has not been checking at home.  In the past he was on metoprolol no current medications.  He has some difficullty with swallowing  foods, feels like food gets hung and he will start . He gets hiccups He is on prilosec 20mg  twice daily already.  He would like to be referred back to his gastroenterologist to evaluate his swallowing  Also been getting severe leg cramps.  Worse at nighttime.    Review Of Systems:  GEN- denies fatigue, fever, weight loss,weakness, recent illness HEENT- denies eye drainage, change in vision, nasal  discharge, CVS- denies chest pain, palpitations RESP- denies SOB, cough, wheeze ABD- denies N/V, change in stools, abd pain GU- denies dysuria, hematuria, dribbling, incontinence MSK- denies joint pain, muscle aches, injury Neuro- denies headache, dizziness, syncope, seizure activity       Objective:    BP 138/82   Pulse 78   Temp 98.2 F (36.8 C) (Temporal)   Resp 14   Ht 5\' 11"  (1.803 m)   Wt 217 lb (98.4 kg)   SpO2 96%   BMI 30.27 kg/m  GEN- NAD, alert and oriented x3 HEENT- PERRL, EOMI, non injected sclera, pink conjunctiva, MMM, oropharynx clear Neck- Supple,  CVS- RRR, no murmur RESP-CTAB ABD-NABS,soft, mild tenderness to palpation left lower quadrant no rebound no guarding no CVA tenderness EXT- No edema Pulses- Radial, DP- 2+        Assessment & Plan:      Problem List Items Addressed This Visit      Unprioritized   Diabetes type 2, controlled ([place])    Check A1c.      Relevant Orders   CBC with Differential/Platelet   Hemoglobin A1c   GERD - Primary    Chronic reflux issues as well as dysphagia with different foods.  We will get him set up with GI.  He may have stricture.      Relevant Orders   Ambulatory referral to Gastroenterology   HTN (hypertension)    Blood pressure looks okay in the office.  We will have him  monitor his blood pressure at home.  His blood pressure is standing more than 140/90 at home we will restart his medication.       Other Visit Diagnoses    Leg cramps       check mag level   Relevant Orders   Magnesium   Dysphagia, unspecified type       Relevant Orders   Ambulatory referral to Gastroenterology   Diverticulitis       Will treat for presumptive diverticulitis based one exam, elevated WBC, known diverticular disease. Cipro/flagyl given, hold on imaging      Note: This dictation was prepared with Dragon dictation along with smaller phrase technology. Any transcriptional errors that result from this process are  unintentional.

## 2020-01-04 NOTE — Assessment & Plan Note (Signed)
Chronic reflux issues as well as dysphagia with different foods.  We will get him set up with GI.  He may have stricture.

## 2020-01-04 NOTE — Patient Instructions (Addendum)
Referral to Dr. Ardis Hughs  Cipro and flagyl for diverticulitis  We will call with lab results Increase your fluids  F/U 4 months for Physical  End of June     COVID Vaccination Information As of right now, we will not be giving COVID-19 vaccines here in our office. It is too many storage and administrating regulations that our office is not equipped to provide at this time.    You can go online at [email or web address]   That website will give you information on all counties.    If not here are the numbers you can call. [place] or [place] [phone] opt.2 [facility] Department [phone]   You can also find information on [email or web address] or call the state's COVID-19 information phone number at 211.

## 2020-01-04 NOTE — Assessment & Plan Note (Signed)
Check A1c. 

## 2020-01-05 LAB — CBC WITH DIFFERENTIAL/PLATELET
Absolute Monocytes: 1198 cells/uL — ABNORMAL HIGH (ref 200–950)
Basophils Absolute: 149 cells/uL (ref 0–200)
Basophils Relative: 1.5 %
Eosinophils Absolute: 228 cells/uL (ref 15–500)
Eosinophils Relative: 2.3 %
HCT: 50.5 % — ABNORMAL HIGH (ref 38.5–50.0)
Hemoglobin: 17.5 g/dL — ABNORMAL HIGH (ref 13.2–17.1)
Lymphs Abs: 1891 cells/uL (ref 850–3900)
MCH: 32.4 pg (ref 27.0–33.0)
MCHC: 34.7 g/dL (ref 32.0–36.0)
MCV: 93.5 fL (ref 80.0–100.0)
MPV: 9.5 fL (ref 7.5–12.5)
Monocytes Relative: 12.1 %
Neutro Abs: 6435 cells/uL (ref 1500–7800)
Neutrophils Relative %: 65 %
Platelets: 430 10*3/uL — ABNORMAL HIGH (ref 140–400)
RBC: 5.4 10*6/uL (ref 4.20–5.80)
RDW: 12.5 % (ref 11.0–15.0)
Total Lymphocyte: 19.1 %
WBC: 9.9 10*3/uL (ref 3.8–10.8)

## 2020-01-05 LAB — MAGNESIUM: Magnesium: 2.1 mg/dL (ref 1.5–2.5)

## 2020-01-05 LAB — HEMOGLOBIN A1C
Hgb A1c MFr Bld: 5.8 % of total Hgb — ABNORMAL HIGH (ref <5.7)
Mean Plasma Glucose: 120 (calc)
eAG (mmol/L): 6.6 (calc)

## 2020-01-12 ENCOUNTER — Other Ambulatory Visit: Payer: Self-pay

## 2020-01-12 ENCOUNTER — Ambulatory Visit: Payer: Medicare HMO | Admitting: Pulmonary Disease

## 2020-01-12 ENCOUNTER — Telehealth: Payer: Self-pay | Admitting: Pulmonary Disease

## 2020-01-12 ENCOUNTER — Ambulatory Visit (INDEPENDENT_AMBULATORY_CARE_PROVIDER_SITE_OTHER): Payer: Medicare HMO

## 2020-01-12 ENCOUNTER — Encounter: Payer: Self-pay | Admitting: Pulmonary Disease

## 2020-01-12 VITALS — BP 120/78 | HR 82 | Temp 97.8°F | Ht 71.0 in | Wt 218.4 lb

## 2020-01-12 DIAGNOSIS — Z87891 Personal history of nicotine dependence: Secondary | ICD-10-CM

## 2020-01-12 DIAGNOSIS — G4733 Obstructive sleep apnea (adult) (pediatric): Secondary | ICD-10-CM | POA: Diagnosis not present

## 2020-01-12 DIAGNOSIS — R0600 Dyspnea, unspecified: Secondary | ICD-10-CM | POA: Diagnosis not present

## 2020-01-12 NOTE — Telephone Encounter (Signed)
Will send to other DME in network w/ pt's insurance.

## 2020-01-12 NOTE — Patient Instructions (Addendum)
Insomnia Polycythemia Prior history of obstructive sleep apnea  Overnight oximetry-check for low oxygen levels at night Breathing study to check for lung disease-past history of smoking Chest x-ray  Behavioral modifications -Strict bedtime and wake up time -Try to wake up about the same time every day, example 7 AM, try and be in bed for at most 8 hours -No daytime naps  We will refer you cognitive behavioral therapy-may help address some of the problems of not being able to fall asleep  We can consider a sleep aid to help you fall asleep following starting seeing a psychotherapist  Graded regular exercises will also help your sleep quality  Follow-up visit in 2 months   Sleep Apnea Sleep apnea is a condition in which breathing pauses or becomes shallow during sleep. Episodes of sleep apnea usually last 10 seconds or longer, and they may occur as many as 20 times an hour. Sleep apnea disrupts your sleep and keeps your body from getting the rest that it needs. This condition can increase your risk of certain health problems, including:  Heart attack.  Stroke.  Obesity.  Diabetes.  Heart failure.  Irregular heartbeat. What are the causes? There are three kinds of sleep apnea:  Obstructive sleep apnea. This kind is caused by a blocked or collapsed airway.  Central sleep apnea. This kind happens when the part of the brain that controls breathing does not send the correct signals to the muscles that control breathing.  Mixed sleep apnea. This is a combination of obstructive and central sleep apnea. The most common cause of this condition is a collapsed or blocked airway. An airway can collapse or become blocked if:  Your throat muscles are abnormally relaxed.  Your tongue and tonsils are larger than normal.  You are overweight.  Your airway is smaller than normal. What increases the risk? You are more likely to develop this condition if you:  Are  overweight.  Smoke.  Have a smaller than normal airway.  Are elderly.  Are male.  Drink alcohol.  Take sedatives or tranquilizers.  Have a family history of sleep apnea. What are the signs or symptoms? Symptoms of this condition include:  Trouble staying asleep.  Daytime sleepiness and tiredness.  Irritability.  Loud snoring.  Morning headaches.  Trouble concentrating.  Forgetfulness.  Decreased interest in sex.  Unexplained sleepiness.  Mood swings.  Personality changes.  Feelings of depression.  Waking up often during the night to urinate.  Dry mouth.  Sore throat. How is this diagnosed? This condition may be diagnosed with:  A medical history.  A physical exam.  A series of tests that are done while you are sleeping (sleep study). These tests are usually done in a sleep lab, but they may also be done at home. How is this treated? Treatment for this condition aims to restore normal breathing and to ease symptoms during sleep. It may involve managing health issues that can affect breathing, such as high blood pressure or obesity. Treatment may include:  Sleeping on your side.  Using a decongestant if you have nasal congestion.  Avoiding the use of depressants, including alcohol, sedatives, and narcotics.  Losing weight if you are overweight.  Making changes to your diet.  Quitting smoking.  Using a device to open your airway while you sleep, such as: ? An oral appliance. This is a custom-made mouthpiece that shifts your lower jaw forward. ? A continuous positive airway pressure (CPAP) device. This device blows air through a  mask when you breathe out (exhale). ? A nasal expiratory positive airway pressure (EPAP) device. This device has valves that you put into each nostril. ? A bi-level positive airway pressure (BPAP) device. This device blows air through a mask when you breathe in (inhale) and breathe out (exhale).  Having surgery if other  treatments do not work. During surgery, excess tissue is removed to create a wider airway. It is important to get treatment for sleep apnea. Without treatment, this condition can lead to:  High blood pressure.  Coronary artery disease.  In men, an inability to achieve or maintain an erection (impotence).  Reduced thinking abilities. Follow these instructions at home: Lifestyle  Make any lifestyle changes that your health care provider recommends.  Eat a healthy, well-balanced diet.  Take steps to lose weight if you are overweight.  Avoid using depressants, including alcohol, sedatives, and narcotics.  Do not use any products that contain nicotine or tobacco, such as cigarettes, e-cigarettes, and chewing tobacco. If you need help quitting, ask your health care provider. General instructions  Take over-the-counter and prescription medicines only as told by your health care provider.  If you were given a device to open your airway while you sleep, use it only as told by your health care provider.  If you are having surgery, make sure to tell your health care provider you have sleep apnea. You may need to bring your device with you.  Keep all follow-up visits as told by your health care provider. This is important. Contact a health care provider if:  The device that you received to open your airway during sleep is uncomfortable or does not seem to be working.  Your symptoms do not improve.  Your symptoms get worse. Get help right away if:  You develop: ? Chest pain. ? Shortness of breath. ? Discomfort in your back, arms, or stomach.  You have: ? Trouble speaking. ? Weakness on one side of your body. ? Drooping in your face. These symptoms may represent a serious problem that is an emergency. Do not wait to see if the symptoms will go away. Get medical help right away. Call your local emergency services (911 in the [place] Do not drive yourself to the  hospital. Summary  Sleep apnea is a condition in which breathing pauses or becomes shallow during sleep.  The most common cause is a collapsed or blocked airway.  The goal of treatment is to restore normal breathing and to ease symptoms during sleep. This information is not intended to replace advice given to you by your health care provider. Make sure you discuss any questions you have with your health care provider. Document Revised: 05/04/2019 Document Reviewed: 07/13/2018 Elsevier Patient Education  [place].

## 2020-01-12 NOTE — Progress Notes (Signed)
Subjective:    Patient ID: Hunter Pratt, adult    DOB: 11/24/1956, 65 y.o.   MRN: [id number]  Patient with concern for obstructive sleep apnea  Stated he had sleep study performed in 2010 He recollects not being able to sleep at all during that study but he was told that he has obstructive sleep apnea He was started on CPAP at the time-used it for 1 year and just never got used to using it  Concern for sleep disordered breathing currently stems from having polycythemia  Denies any history of lung disease Smoked in the past, quit between [id number] and [id number] Illicit drug use in the past Is HIV positive-followed by Dr. Tommy Medal  Admits to snoring Denies dryness of his mouth Denies headaches in the mornings Feels his memory is fine  Admits to not being able to get his mind to shut down at night Usually tries to go to bed about 11 PM Takes him about an hour plus to fall asleep About 2 awakenings during the night Wakes up between 5-9 AM in the morning    Past Medical History:  Diagnosis Date  . Abnormal white blood cell 01/27/2018  . Anemia    past hx   . Arthritis   . Constipation 01/28/2017  . DDD (degenerative disc disease), cervical   . Diffuse abdominal pain 12/30/2018  . Ganglion cyst 01/28/2017  . GERD (gastroesophageal reflux disease)   . Hearing loss    Bilateral  . HIV infection ([place])   . Hyperlipidemia   . Kidney stone   . LLQ pain 12/28/2019  . OA (osteoarthritis) of knee   . Obesity 06/30/2019  . PUD (peptic ulcer disease)   . Sleep apnea    dx'd no current cpap use  . Systolic dysfunction    EF 40-45%   Social History   Socioeconomic History  . Marital status: Divorced    Spouse name: Not on file  . Number of children: Not on file  . Years of education: Not on file  . Highest education level: Not on file  Occupational History  . Not on file  Tobacco Use  . Smoking status: Former Smoker    Packs/day: 1.00    Years: 20.00    Pack years: 20.00    Types:  Cigarettes    Quit date: 2008    Years since quitting: 13.1  . Smokeless tobacco: Never Used  Substance and Sexual Activity  . Alcohol use: No  . Drug use: No  . Sexual activity: Never    Comment: refused condoms  Other Topics Concern  . Not on file  Social History Narrative  . Not on file   Social Determinants of Health   Financial Resource Strain:   . Difficulty of Paying Living Expenses: Not on file  Food Insecurity:   . Worried About Charity fundraiser in the Last Year: Not on file  . Ran Out of Food in the Last Year: Not on file  Transportation Needs:   . Lack of Transportation (Medical): Not on file  . Lack of Transportation (Non-Medical): Not on file  Physical Activity:   . Days of Exercise per Week: Not on file  . Minutes of Exercise per Session: Not on file  Stress:   . Feeling of Stress : Not on file  Social Connections:   . Frequency of Communication with Friends and Family: Not on file  . Frequency of Social Gatherings with Friends and Family: Not on  file  . Attends Religious Services: Not on file  . Active Member of Clubs or Organizations: Not on file  . Attends Archivist Meetings: Not on file  . Marital Status: Not on file  Intimate Partner Violence:   . Fear of Current or Ex-Partner: Not on file  . Emotionally Abused: Not on file  . Physically Abused: Not on file  . Sexually Abused: Not on file   Family History  Problem Relation Age of Onset  . Hypertension Mother   . Osteoporosis Mother   . Osteoporosis Father   . Depression Father   . Hearing loss Father   . Colon polyps Father   . Osteoporosis Sister   . Arthritis Sister   . Hypertension Brother   . Hyperlipidemia Brother   . Depression Brother   . Colon cancer Neg Hx   . Rectal cancer Neg Hx   . Stomach cancer Neg Hx   . Esophageal cancer Neg Hx    Review of Systems  Gastrointestinal: Positive for abdominal pain.  Musculoskeletal: Positive for arthralgias.    Psychiatric/Behavioral: Positive for sleep disturbance.      Objective:   Physical Exam Constitutional:      Appearance: Normal appearance.  HENT:     Head: Normocephalic and atraumatic.     Nose: Nose normal.     Mouth/Throat:     Mouth: Mucous membranes are moist.     Comments: Mallampati 4, crowded oropharynx Eyes:     General:        Right eye: No discharge.        Left eye: No discharge.     Pupils: Pupils are equal, round, and reactive to light.  Cardiovascular:     Rate and Rhythm: Normal rate and regular rhythm.     Pulses: Normal pulses.     Heart sounds: Normal heart sounds. No murmur. No friction rub.  Pulmonary:     Effort: Pulmonary effort is normal. No respiratory distress.     Breath sounds: Normal breath sounds. No stridor. No wheezing or rhonchi.  Musculoskeletal:        General: Normal range of motion.     Cervical back: Normal range of motion and neck supple. No rigidity or tenderness.  Skin:    General: Skin is warm.  Neurological:     General: No focal deficit present.     Mental Status: He is alert and oriented to person, place, and time.  Psychiatric:        Mood and Affect: Mood normal.    Vitals:   01/12/20 0857  BP: 120/78  Pulse: 82  Temp: 97.8 F (36.6 C)  SpO2: 98%   Results of the Epworth flowsheet 01/12/2020  Sitting and reading 0  Watching TV 0  Sitting, inactive in a public place (e.g. a theatre or a meeting) 0  As a passenger in a car for an hour without a break 0  Lying down to rest in the afternoon when circumstances permit 2  Sitting and talking to someone 0  Sitting quietly after a lunch without alcohol 1  In a car, while stopped for a few minutes in traffic 0  Total score 3   Previous sleep study from 2010 is not available   Assessment & Plan:   .  Multifactorial reasons for polycythemia I think .  There is a possibility of obstructive sleep apnea since he does have a history of diagnosis of obstructive sleep apnea in  2010 .  Patient feels he did not sleep at all during that study so is unsure of a diagnosis of sleep apnea .  There is likely an underlying mood disorder  Pathophysiology of sleep disordered breathing discussed with patient Treatment options for sleep disordered breathing discussed with the patient  Plan:  .  We will obtain an overnight oximetry to ascertain oxygen desaturations at night  .  Obtain a pulmonary function test to rule out underlying significant lung disease as he did have a significant smoking history  .  Obtain a chest x-ray  .  I believe patient will benefit best from seeing a cognitive behavioral therapist to address issues with insomnia  .  He is absolutely adamant that he will not be able to sleep in the sleep lab  .  A home sleep study will not be appropriate in this situation as well until we have a better handle on his insomnia  .  Sleep behavioral modification discussed .  Set up wake up time and try and get about 8 hours of sleep at most .  Avoid daytime naps .  Add regular exercises/activity  .  I will see him back here in about 2 months

## 2020-01-12 NOTE — Progress Notes (Signed)
Thanks so much for seeing him. 

## 2020-01-24 ENCOUNTER — Other Ambulatory Visit: Payer: Self-pay | Admitting: Family Medicine

## 2020-01-28 ENCOUNTER — Encounter: Payer: Self-pay | Admitting: Family Medicine

## 2020-01-30 MED ORDER — OMEPRAZOLE 20 MG PO CPDR: 20.0000 mg | DELAYED_RELEASE_CAPSULE | Freq: Two times a day (BID) | ORAL | 3 refills | Status: DC

## 2020-02-02 ENCOUNTER — Telehealth: Payer: Self-pay | Admitting: *Deleted

## 2020-02-02 NOTE — Telephone Encounter (Signed)
Received request from pharmacy for PA on Omeprazole 20mg  PO BID.  PA submitted.   Dx: K21.9- GERD

## 2020-02-02 NOTE — Telephone Encounter (Signed)
Your information has been submitted to [employer]. [employer] will review the request and will issue a decision, typically within 1-3 days from your submission. You can check the updated outcome later by reopening this request.  If [employer] has not responded in 1-3 days or if you have any questions about your ePA request, please contact [employer] at [phone]. If you think there may be a problem with your PA request, use our live chat feature at the bottom right.

## 2020-02-03 MED ORDER — OMEPRAZOLE 20 MG PO CPDR: 20.0000 mg | DELAYED_RELEASE_CAPSULE | Freq: Two times a day (BID) | ORAL | 3 refills | Status: DC

## 2020-02-03 NOTE — Telephone Encounter (Signed)
Receive PA deermination.   PA approved 12/02/2019- 11/30/2020.

## 2020-02-10 ENCOUNTER — Ambulatory Visit: Payer: Medicare HMO | Admitting: Gastroenterology

## 2020-03-01 ENCOUNTER — Ambulatory Visit: Payer: Medicare HMO

## 2020-03-10 ENCOUNTER — Other Ambulatory Visit (HOSPITAL_COMMUNITY): Admission: RE | Admit: 2020-03-10 | Payer: Medicare HMO | Source: Ambulatory Visit

## 2020-03-20 ENCOUNTER — Ambulatory Visit: Payer: Medicare HMO | Admitting: Gastroenterology

## 2020-03-30 ENCOUNTER — Other Ambulatory Visit (HOSPITAL_COMMUNITY)
Admission: RE | Admit: 2020-03-30 | Discharge: 2020-03-30 | Disposition: A | Discharge status: Home / Self Care | Payer: Medicare HMO | Source: Ambulatory Visit | Attending: Pulmonary Disease | Admitting: Pulmonary Disease

## 2020-03-30 ENCOUNTER — Other Ambulatory Visit: Payer: Self-pay

## 2020-03-30 DIAGNOSIS — Z20822 Contact with and (suspected) exposure to covid-19: Secondary | ICD-10-CM | POA: Diagnosis not present

## 2020-03-30 DIAGNOSIS — Z01812 Encounter for preprocedural laboratory examination: Secondary | ICD-10-CM | POA: Diagnosis not present

## 2020-03-31 LAB — SARS CORONAVIRUS 2 (TAT 6-24 HRS): SARS Coronavirus 2: NEGATIVE

## 2020-04-02 ENCOUNTER — Encounter: Payer: Self-pay | Admitting: Pulmonary Disease

## 2020-04-02 ENCOUNTER — Ambulatory Visit (INDEPENDENT_AMBULATORY_CARE_PROVIDER_SITE_OTHER): Payer: Medicare HMO | Admitting: Pulmonary Disease

## 2020-04-02 ENCOUNTER — Ambulatory Visit: Payer: Medicare HMO | Admitting: Pulmonary Disease

## 2020-04-02 ENCOUNTER — Other Ambulatory Visit: Payer: Self-pay

## 2020-04-02 VITALS — BP 132/78 | HR 83 | Temp 98.0°F | Ht 71.0 in | Wt 211.0 lb

## 2020-04-02 DIAGNOSIS — D751 Secondary polycythemia: Secondary | ICD-10-CM | POA: Diagnosis not present

## 2020-04-02 DIAGNOSIS — Z87891 Personal history of nicotine dependence: Secondary | ICD-10-CM

## 2020-04-02 LAB — PULMONARY FUNCTION TEST
DL/VA % pred: 115 %
DL/VA: 4.82 ml/min/mmHg/L
DLCO cor % pred: 115 %
DLCO cor: 32.37 ml/min/mmHg
DLCO unc % pred: 115 %
DLCO unc: 32.37 ml/min/mmHg
FEF 25-75 Post: 3.9 L/sec
FEF 25-75 Pre: 4.08 L/sec
FEF2575-%Change-Post: -4 %
FEF2575-%Pred-Post: 131 %
FEF2575-%Pred-Pre: 137 %
FEV1-%Change-Post: 2 %
FEV1-%Pred-Post: 100 %
FEV1-%Pred-Pre: 98 %
FEV1-Post: 3.67 L
FEV1-Pre: 3.6 L
FEV1FVC-%Change-Post: 0 %
FEV1FVC-%Pred-Pre: 106 %
FEV6-%Change-Post: 1 %
FEV6-%Pred-Post: 95 %
FEV6-%Pred-Pre: 93 %
FEV6-Post: 4.42 L
FEV6-Pre: 4.36 L
FEV6FVC-%Pred-Post: 104 %
FEV6FVC-%Pred-Pre: 104 %
FVC-%Change-Post: 1 %
FVC-%Pred-Post: 92 %
FVC-%Pred-Pre: 91 %
FVC-Post: 4.53 L
FVC-Pre: 4.47 L
Post FEV1/FVC ratio: 81 %
Post FEV6/FVC ratio: 100 %
Pre FEV1/FVC ratio: 80 %
Pre FEV6/FVC Ratio: 100 %
RV % pred: 186 %
RV: 4.37 L
TLC % pred: 118 %
TLC: 8.52 L

## 2020-04-02 NOTE — Patient Instructions (Signed)
Polycythemia  Order overnight oximetry on room air  I will see you back in 4-5 months Behavioral modifications for adequate sleep

## 2020-04-02 NOTE — Progress Notes (Signed)
Full PFT performed today. °

## 2020-04-02 NOTE — Progress Notes (Signed)
Subjective:    Patient ID: Hunter Pratt, adult    DOB: 1956-05-10, 64 y.o.   MRN: [id number]  Patient with concern for obstructive sleep apnea  He states he feels generally well Still has problems with insomnia Denies any significant symptoms at present  Sleep study performed in 2010-patient recollects not sleeping well during that study He was started on CPAP and used it for about a year Never got used to using it regularly and discontinued it.  Concern for sleep disordered breathing currently stems from having polycythemia-patient believes his polycythemia started after starting antiretrovirals and this is what he believes is related to  Denies any history of lung disease Smoked in the past, quit between [id number] and AB-[id number] Illicit drug use in the past Is HIV positive-followed by Dr. Tommy Medal  Admits to snoring Denies dryness of his mouth Denies headaches in the mornings Feels his memory is fine  Admits to not being able to get his mind to shut down at night Usually tries to go to bed about 11 PM Takes him about an hour plus to fall asleep About 2 awakenings during the night Wakes up between 5-9 AM in the morning   Past Medical History:  Diagnosis Date  . Abnormal white blood cell 01/27/2018  . Anemia    past hx   . Arthritis   . Constipation 01/28/2017  . DDD (degenerative disc disease), cervical   . Diffuse abdominal pain 12/30/2018  . Ganglion cyst 01/28/2017  . GERD (gastroesophageal reflux disease)   . Hearing loss    Bilateral  . HIV infection ([place])   . Hyperlipidemia   . Kidney stone   . LLQ pain 12/28/2019  . OA (osteoarthritis) of knee   . Obesity 06/30/2019  . PUD (peptic ulcer disease)   . Sleep apnea    dx'd no current cpap use  . Systolic dysfunction    EF 40-45%   Social History   Socioeconomic History  . Marital status: Divorced    Spouse name: Not on file  . Number of children: Not on file  . Years of education: Not on file  . Highest education  level: Not on file  Occupational History  . Not on file  Tobacco Use  . Smoking status: Former Smoker    Packs/day: 1.00    Years: 20.00    Pack years: 20.00    Types: Cigarettes    Quit date: 2008    Years since quitting: 13.3  . Smokeless tobacco: Never Used  Substance and Sexual Activity  . Alcohol use: No  . Drug use: No  . Sexual activity: Never    Comment: refused condoms  Other Topics Concern  . Not on file  Social History Narrative  . Not on file   Social Determinants of Health   Financial Resource Strain:   . Difficulty of Paying Living Expenses:   Food Insecurity:   . Worried About Charity fundraiser in the Last Year:   . Arboriculturist in the Last Year:   Transportation Needs:   . Film/video editor (Medical):   Marland Kitchen Lack of Transportation (Non-Medical):   Physical Activity:   . Days of Exercise per Week:   . Minutes of Exercise per Session:   Stress:   . Feeling of Stress :   Social Connections:   . Frequency of Communication with Friends and Family:   . Frequency of Social Gatherings with Friends and Family:   .  Attends Religious Services:   . Active Member of Clubs or Organizations:   . Attends Archivist Meetings:   Marland Kitchen Marital Status:   Intimate Partner Violence:   . Fear of Current or Ex-Partner:   . Emotionally Abused:   Marland Kitchen Physically Abused:   . Sexually Abused:    Family History  Problem Relation Age of Onset  . Hypertension Mother   . Osteoporosis Mother   . Osteoporosis Father   . Depression Father   . Hearing loss Father   . Colon polyps Father   . Osteoporosis Sister   . Arthritis Sister   . Hypertension Brother   . Hyperlipidemia Brother   . Depression Brother   . Colon cancer Neg Hx   . Rectal cancer Neg Hx   . Stomach cancer Neg Hx   . Esophageal cancer Neg Hx    Review of Systems  Gastrointestinal: Positive for abdominal pain.  Musculoskeletal: Positive for arthralgias.  Psychiatric/Behavioral: Positive for  sleep disturbance.      Objective:   Physical Exam Constitutional:      Appearance: Normal appearance.  HENT:     Head: Normocephalic and atraumatic.     Nose: Nose normal.     Mouth/Throat:     Mouth: Mucous membranes are moist.     Comments: Mallampati 4, crowded oropharynx Eyes:     General:        Right eye: No discharge.        Left eye: No discharge.     Pupils: Pupils are equal, round, and reactive to light.  Cardiovascular:     Rate and Rhythm: Normal rate and regular rhythm.     Pulses: Normal pulses.     Heart sounds: Normal heart sounds. No murmur. No friction rub.  Pulmonary:     Effort: Pulmonary effort is normal. No respiratory distress.     Breath sounds: Normal breath sounds. No stridor. No wheezing or rhonchi.  Musculoskeletal:     Cervical back: Normal range of motion and neck supple. No rigidity or tenderness.  Neurological:     Mental Status: He is alert.  Psychiatric:        Mood and Affect: Mood normal.    There were no vitals filed for this visit. Results of the Epworth flowsheet 01/12/2020  Sitting and reading 0  Watching TV 0  Sitting, inactive in a public place (e.g. a theatre or a meeting) 0  As a passenger in a car for an hour without a break 0  Lying down to rest in the afternoon when circumstances permit 2  Sitting and talking to someone 0  Sitting quietly after a lunch without alcohol 1  In a car, while stopped for a few minutes in traffic 0  Total score 3   Previous sleep study from 2010 is not available   Assessment & Plan:   .  Multifactorial reasons for polycythemia .  There is a possibility of obstructive sleep apnea since he does have a history of diagnosis of obstructive sleep apnea in 2010 .  Patient is unsure about the diagnosis of obstructive sleep apnea, he stated he did not sleep well during that study  Pathophysiology of sleep disordered breathing discussed with patient Treatment options for sleep disordered breathing  discussed with the patient  Plan:  .  Obtain overnight oximetry .  Could not obtain during his last visit-declined by his insurance  .  Pulmonary function test is within normal limits  .  He is absolutely adamant that he will not be able to sleep in the sleep lab  .  A home sleep study will not be appropriate in this situation as well until we have a better handle on his insomnia  .  Sleep behavioral modification discussed .  Set up wake up time and try and get about 8 hours of sleep at most .  Avoid daytime naps .  Add regular exercises/activity  .  I will see him back here in about 3 months

## 2020-04-04 ENCOUNTER — Telehealth: Payer: Self-pay | Admitting: Pulmonary Disease

## 2020-04-04 ENCOUNTER — Telehealth: Payer: Self-pay

## 2020-04-04 DIAGNOSIS — D751 Secondary polycythemia: Secondary | ICD-10-CM

## 2020-04-04 NOTE — Telephone Encounter (Signed)
Needs to be in AO's name

## 2020-04-04 NOTE — Telephone Encounter (Signed)
Sending new ONO order to [facility].

## 2020-04-04 NOTE — Telephone Encounter (Signed)
Response from [facility] for ONO:    Skeet Latch sent to Angelina Sheriff, [address], [place]; Skeet Latch   [facility],   The RX is missing the instructions on how to test the patient (on room air, pap, etc). Can you please have them fix that?   Thanks!       Previous Messages   ----- Message -----  From: Hunter Pratt  Sent: 04/03/2020  8:24 AM EDT  To: Hunter Pratt, Hunter Pratt, *  Subject: RE: Hunter Pratt it.   ----- Message -----  From: Vella Kohler D  Sent: 04/02/2020  4:31 PM EDT  To: Hunter Pratt, Hunter Pratt  SubjectJoselyn Pratt                        DOB: [birth year]   Order placed by Dr. Ander Slade. Please advise.   Thank you,  [facility]

## 2020-04-04 NOTE — Telephone Encounter (Signed)
Instructions from pt's last OV  Polycythemia  Order overnight oximetry on room air  I will see you back in 4-5 months Behavioral modifications for adequate sleep     I have placed a new order stating for pt to do ONO on room air. Routing back to [facility]

## 2020-04-09 DIAGNOSIS — J449 Chronic obstructive pulmonary disease, unspecified: Secondary | ICD-10-CM | POA: Diagnosis not present

## 2020-04-09 DIAGNOSIS — R0902 Hypoxemia: Secondary | ICD-10-CM | POA: Diagnosis not present

## 2020-04-19 ENCOUNTER — Telehealth: Payer: Self-pay | Admitting: Pulmonary Disease

## 2020-04-19 NOTE — Telephone Encounter (Signed)
Spoke with pt and advised that I have called [facility] to get ONO results and they had to email the respiratory dept. To have someone call back with the results. Pt verbalized understanding. Will await call back from [facility].

## 2020-04-23 ENCOUNTER — Other Ambulatory Visit: Payer: Self-pay | Admitting: Family Medicine

## 2020-05-03 ENCOUNTER — Other Ambulatory Visit: Payer: Self-pay

## 2020-05-07 ENCOUNTER — Ambulatory Visit: Payer: Medicare HMO | Admitting: Gastroenterology

## 2020-05-07 ENCOUNTER — Encounter: Payer: Self-pay | Admitting: Gastroenterology

## 2020-05-07 DIAGNOSIS — K219 Gastro-esophageal reflux disease without esophagitis: Secondary | ICD-10-CM

## 2020-05-07 DIAGNOSIS — R1310 Dysphagia, unspecified: Secondary | ICD-10-CM | POA: Diagnosis not present

## 2020-05-07 MED ORDER — OMEPRAZOLE 40 MG PO CPDR
DELAYED_RELEASE_CAPSULE | ORAL | 3 refills | Status: DC
Start: 2020-05-07 — End: 2020-09-05

## 2020-05-07 MED ORDER — FAMOTIDINE 20 MG PO TABS
20.0000 mg | ORAL_TABLET | Freq: Every day | ORAL | Status: AC
Start: 2020-05-07 — End: ?

## 2020-05-07 NOTE — Progress Notes (Signed)
Review of pertinent gastrointestinal problems: 1.  History of adenomatous colon polyps.  Colonoscopy 2009 single subcentimeter adenoma.  Colonoscopy 2014 2 subcentimeter adenomas removed.  Colonoscopy September 2019 6 subcentimeter polyps removed, pancolonic diverticulosis also noted.  Pathology confirmed most of the polyps were adenomatous.  Recommended recall 3 years.   HPI: This is a very pleasant man whom I last saw September 2019 for routine polyp surveillance.  I have seen him for routine colon polyp surveillance.  He is here today for a new problem.  He tells me that several months ago he was bothered by left upper quadrant pains that lasted 2 to 3 weeks.  Pains were severe.  He did not have any imaging during this time.  After a couple weeks one of his providers put him on Cipro and Flagyl for possible diverticulitis and he said that that helped.  October 2020 he was bothered by hiccups and dysphagia.  He describes food lodging at his lower sternum.  He will sometimes have to vomit it out.  This has improved since he started modifying the way he eats with chewing his food very well eating very slowly taking small bites.  He has been on antiacid medicines for quite a while.  Currently he takes omeprazole 20 mg twice daily.  Sounds like he has breakthrough GERD even on this regimen.  Blood work February 2021 shows hemoglobin A1c 5.8, essentially normal CBC except that his white blood cell count was 13,000.  Normalized 1 week later.  Blood work January 2021 shows normal complete metabolic profile, normal amylase and lipase.     ROS: complete GI ROS as described in HPI, all other review negative.  Constitutional:  No unintentional weight loss   Past Medical History:  Diagnosis Date  . Abnormal white blood cell 01/27/2018  . Anemia    past hx   . Arthritis   . Constipation 01/28/2017  . DDD (degenerative disc disease), cervical   . Diffuse abdominal pain 12/30/2018  . Ganglion cyst  01/28/2017  . GERD (gastroesophageal reflux disease)   . Hearing loss    Bilateral  . HIV infection ([place])   . Hyperlipidemia   . Kidney stone   . LLQ pain 12/28/2019  . OA (osteoarthritis) of knee   . Obesity 06/30/2019  . PUD (peptic ulcer disease)   . Sleep apnea    dx'd no current cpap use  . Systolic dysfunction    EF 40-45%    Past Surgical History:  Procedure Laterality Date  . CERVICAL DISCECTOMY  1990 and 1991  . COLONOSCOPY    . GANGLION CYST EXCISION Left 03/18/2017   Procedure: REMOVAL GANGLION OF LEFT WRIST, TENOLYSIS;  Surgeon: Leandrew Koyanagi, MD;  Location: [facility];  Service: Orthopedics;  Laterality: Left;  . POLYPECTOMY      Current Outpatient Medications  Medication Sig Dispense Refill  . aspirin 325 MG tablet Take 325 mg by mouth daily.    Marland Kitchen BIKTARVY 50-200-25 MG TABS tablet TAKE 1 TABLET BY MOUTH DAILY 30 tablet 5  . fluocinonide ointment (LIDEX) 0.05 % APPLY EXTERNALLY TO THE AFFECTED AREA TWICE DAILY 30 g 1  . omeprazole (PRILOSEC) 20 MG capsule TAKE 1 CAPSULE(20 MG) BY MOUTH TWICE DAILY 60 capsule 3  . traZODone (DESYREL) 50 MG tablet TAKE 1 TABLET(50 MG) BY MOUTH AT BEDTIME AS NEEDED FOR SLEEP 90 tablet 3   No current facility-administered medications for this visit.    Allergies as of 05/07/2020 - Review  Complete 05/07/2020  Allergen Reaction Noted  . Naproxen sodium    . Nsaids    . Other Rash 06/13/2013    Family History  Problem Relation Age of Onset  . Hypertension Mother   . Osteoporosis Mother   . Osteoporosis Father   . Depression Father   . Hearing loss Father   . Colon polyps Father   . Osteoporosis Sister   . Arthritis Sister   . Hypertension Brother   . Hyperlipidemia Brother   . Depression Brother   . Colon cancer Neg Hx   . Rectal cancer Neg Hx   . Stomach cancer Neg Hx   . Esophageal cancer Neg Hx     Social History   Socioeconomic History  . Marital status: Divorced    Spouse name: Not on file  .  Number of children: Not on file  . Years of education: Not on file  . Highest education level: Not on file  Occupational History  . Not on file  Tobacco Use  . Smoking status: Former Smoker    Packs/day: 1.00    Years: 20.00    Pack years: 20.00    Types: Cigarettes    Quit date: 2008    Years since quitting: 13.4  . Smokeless tobacco: Never Used  Substance and Sexual Activity  . Alcohol use: No  . Drug use: No  . Sexual activity: Never    Comment: refused condoms  Other Topics Concern  . Not on file  Social History Narrative  . Not on file   Social Determinants of Health   Financial Resource Strain:   . Difficulty of Paying Living Expenses:   Food Insecurity:   . Worried About Charity fundraiser in the Last Year:   . Arboriculturist in the Last Year:   Transportation Needs:   . Film/video editor (Medical):   Marland Kitchen Lack of Transportation (Non-Medical):   Physical Activity:   . Days of Exercise per Week:   . Minutes of Exercise per Session:   Stress:   . Feeling of Stress :   Social Connections:   . Frequency of Communication with Friends and Family:   . Frequency of Social Gatherings with Friends and Family:   . Attends Religious Services:   . Active Member of Clubs or Organizations:   . Attends Archivist Meetings:   Marland Kitchen Marital Status:   Intimate Partner Violence:   . Fear of Current or Ex-Partner:   . Emotionally Abused:   Marland Kitchen Physically Abused:   . Sexually Abused:      Physical Exam: Ht 5' 8.75" (1.746 m)   Wt 216 lb 8 oz (98.2 kg)   BMI 32.20 kg/m  Constitutional: generally well-appearing Psychiatric: alert and oriented x3 Abdomen: soft, nontender, nondistended, no obvious ascites, no peritoneal signs, normal bowel sounds No peripheral edema noted in lower extremities  Assessment and plan: 64 y.o. adult with left upper quadrant pains, GERD, dysphagia  First I am not sure what caused his rather severe left upper quadrant pains several  months ago.  Certainly could have been diverticulitis.  His pains improved when he was put on antibiotics by another provider.  He had had pains for about 2 weeks at that point.  I explained to him that if he has recurrent severe left-sided abdominal pains he should call here.  I would likely recommend imaging with CT scan abdomen pelvis at that time and also possibly repeat lab work.  He has chronic GERD and has been bothered by intermittent dysphagia.  Fortunately he is not losing weight.  I think possibly his dysphagia has been related to GERD, peptic stricturing, possibly acid related edema.  His symptoms have definitely improved since he modified the way he is eating.  I recommended EGD to exclude other potential causes such as neoplasm.  I am also the modified the way he is taking his antiacid medicines so that he takes 40 mg of omeprazole every morning and he will also start taking Pepcid 20 mg at bedtime every night.  Please see the "Patient Instructions" section for addition details about the plan.  Owens Loffler, MD [facility] 05/07/2020, 9:43 AM   Total time on date of encounter was 45 minutes (this included time spent preparing to see the patient reviewing records; obtaining and/or reviewing separately obtained history; performing a medically appropriate exam and/or evaluation; counseling and educating the patient and family if present; ordering medications, tests or procedures if applicable; and documenting clinical information in the health record).

## 2020-05-07 NOTE — Patient Instructions (Signed)
If you are age 63 or older, your body mass index should be between 23-30. Your Body mass index is 32.2 kg/m. If this is out of the aforementioned range listed, please consider follow up with your Primary Care Provider.  If you are age 68 or younger, your body mass index should be between 19-25. Your Body mass index is 32.2 kg/m. If this is out of the aformentioned range listed, please consider follow up with your Primary Care Provider.   You have been scheduled for an endoscopy. Please follow written instructions given to you at your visit today. If you use inhalers (even only as needed), please bring them with you on the day of your procedure.  Due to recent changes in healthcare laws, you may see the results of your imaging and laboratory studies on MyChart before your provider has had a chance to review them.  We understand that in some cases there may be results that are confusing or concerning to you. Not all laboratory results come back in the same time frame and the provider may be waiting for multiple results in order to interpret others.  Please give [place] 48 hours in order for your provider to thoroughly review all the results before contacting the office for clarification of your results.   Please purchase the following medications over the counter and take as directed:  START: pepcid 20mg  take one tablet at bedtime every night.  CHANGE: omeprazole to 40mg  take one capsule 20-30 minutes prior to breakfast meal every day.  If left upper quadrant pain worsens or fails to improve, please contact our office at [phone].  Thank you for entrusting me with your care and choosing [facility].  Dr Ardis Hughs

## 2020-05-11 ENCOUNTER — Encounter: Payer: Self-pay | Admitting: Family Medicine

## 2020-05-11 NOTE — Telephone Encounter (Signed)
Dr. Ander Slade, have you received this patient's overnight oximetry results?

## 2020-05-11 NOTE — Telephone Encounter (Signed)
I have not, I do not remember receiving or reviewing any ONO recently

## 2020-05-15 ENCOUNTER — Other Ambulatory Visit: Payer: Self-pay

## 2020-05-22 ENCOUNTER — Encounter: Payer: Self-pay | Admitting: Gastroenterology

## 2020-06-01 ENCOUNTER — Other Ambulatory Visit: Payer: Self-pay

## 2020-06-01 ENCOUNTER — Encounter: Payer: Self-pay | Admitting: Gastroenterology

## 2020-06-01 ENCOUNTER — Ambulatory Visit (AMBULATORY_SURGERY_CENTER): Payer: Medicare HMO | Admitting: Gastroenterology

## 2020-06-01 VITALS — BP 126/81 | HR 69 | Temp 96.8°F | Resp 11 | Ht 68.0 in | Wt 216.0 lb

## 2020-06-01 DIAGNOSIS — K222 Esophageal obstruction: Secondary | ICD-10-CM

## 2020-06-01 DIAGNOSIS — R1310 Dysphagia, unspecified: Secondary | ICD-10-CM | POA: Diagnosis not present

## 2020-06-01 DIAGNOSIS — K2100 Gastro-esophageal reflux disease with esophagitis, without bleeding: Secondary | ICD-10-CM

## 2020-06-01 DIAGNOSIS — R12 Heartburn: Secondary | ICD-10-CM

## 2020-06-01 MED ORDER — SODIUM CHLORIDE 0.9 % IV SOLN: 500.0000 mL | INTRAVENOUS | Status: DC

## 2020-06-01 NOTE — Progress Notes (Signed)
Called to room to assist during endoscopic procedure.  Patient ID and intended procedure confirmed with present staff. Received instructions for my participation in the procedure from the performing physician.  

## 2020-06-01 NOTE — Op Note (Signed)
[facility] Patient Name: Hunter Pratt Procedure Date: 06/01/2020 2:20 PM MRN: [id number] Endoscopist: Milus Banister , MD Age: 64 Referring MD:  Date of Birth: 08-18-1956 Gender: Male Account #: [id number] Procedure:                Upper GI endoscopy Indications:              Dysphagia, Heartburn Medicines:                Monitored Anesthesia Care Procedure:                Pre-Anesthesia Assessment:                           - Prior to the procedure, a History and Physical                            was performed, and patient medications and                            allergies were reviewed. The patient's tolerance of                            previous anesthesia was also reviewed. The risks                            and benefits of the procedure and the sedation                            options and risks were discussed with the patient.                            All questions were answered, and informed consent                            was obtained. Prior Anticoagulants: The patient has                            taken no previous anticoagulant or antiplatelet                            agents. ASA Grade Assessment: III - A patient with                            severe systemic disease. After reviewing the risks                            and benefits, the patient was deemed in                            satisfactory condition to undergo the procedure.                           After obtaining informed consent, the endoscope was  passed under direct vision. Throughout the                            procedure, the patient's blood pressure, pulse, and                            oxygen saturations were monitored continuously. The                            Endoscope was introduced through the mouth, and                            advanced to the second part of duodenum. The upper                            GI endoscopy was accomplished without  difficulty.                            The patient tolerated the procedure well. Scope In: Scope Out: Findings:                 One benign-appearing, intrinsic mild stenosis was                            found at the gastroesophageal junction (thick                            Schatzki's ring vs. focal peptic stricture). A TTS                            dilator was passed through the scope. Dilation with                            an 18-19-20 mm balloon dilator was performed to 20                            mm. There was typical post dilation mucosal                            disruption and minor self limited bleeding.                           The exam was otherwise without abnormality. Complications:            No immediate complications. Estimated blood loss:                            None. Estimated Blood Loss:     Estimated blood loss: none. Impression:               - One benign-appearing, intrinsic mild stenosis was                            found at the gastroesophageal junction (thick  Schatzki's ring vs. focal peptic stricture). A TTS                            dilator was passed through the scope. Dilation with                            an 18-19-20 mm balloon dilator was performed to 20                            mm. There was typical post dilation mucosal                            disruption and minor self limited bleeding.                           - The examination was otherwise normal. Recommendation:           - Patient has a contact number available for                            emergencies. The signs and symptoms of potential                            delayed complications were discussed with the                            patient. Return to normal activities tomorrow.                            Written discharge instructions were provided to the                            patient.                           - Resume previous diet. Eat  slowly, take small                            bites and chew your food very well.                           - Continue present medications. Stay on omeprazole                            before breakfast and pepcid at bedtime.                           - Please call Dr. Ardis Hughs' office in 4-5 weeks to                            report on your response to this dilation. Milus Banister, MD 06/01/2020 2:43:20 PM This report has been signed electronically.

## 2020-06-01 NOTE — Progress Notes (Signed)
pt tolerated well. VSS. awake and to recovery. Report given to RN. Bite block inserted and removed without trauma. 

## 2020-06-01 NOTE — Progress Notes (Signed)
Vs CW ° °

## 2020-06-01 NOTE — Patient Instructions (Signed)
You may resume your previous diet, just take small bites and chew your food well.  You may resume your normal medication schedule.  Thank you for allowing [place] to care for you today!!!   YOU HAD AN ENDOSCOPIC PROCEDURE TODAY AT [facility]:   Refer to the procedure report that was given to you for any specific questions about what was found during the examination.  If the procedure report does not answer your questions, please call your gastroenterologist to clarify.  If you requested that your care partner not be given the details of your procedure findings, then the procedure report has been included in a sealed envelope for you to review at your convenience later.  YOU SHOULD EXPECT: Some feelings of bloating in the abdomen. Passage of more gas than usual.  Walking can help get rid of the air that was put into your GI tract during the procedure and reduce the bloating.  Please Note:  You might notice some irritation and congestion in your nose or some drainage.  This is from the oxygen used during your procedure.  There is no need for concern and it should clear up in a day or so.  SYMPTOMS TO REPORT IMMEDIATELY:   Following upper endoscopy (EGD)  Vomiting of blood or coffee ground material  New chest pain or pain under the shoulder blades  Painful or persistently difficult swallowing  New shortness of breath  Fever of 100F or higher  Black, tarry-looking stools  For urgent or emergent issues, a gastroenterologist can be reached at any hour by calling [phone]. Do not use MyChart messaging for urgent concerns.    DIET:  We do recommend a small meal at first, but then you may proceed to your regular diet.  Drink plenty of fluids but you should avoid alcoholic beverages for 24 hours.  ACTIVITY:  You should plan to take it easy for the rest of today and you should NOT DRIVE or use heavy machinery until tomorrow (because of the sedation medicines used during the  test).    FOLLOW UP: Our staff will call the number listed on your records 48-72 hours following your procedure to check on you and address any questions or concerns that you may have regarding the information given to you following your procedure. If we do not reach you, we will leave a message.  We will attempt to reach you two times.  During this call, we will ask if you have developed any symptoms of COVID 19. If you develop any symptoms (ie: fever, flu-like symptoms, shortness of breath, cough etc.) before then, please call [phone].  If you test positive for Covid 19 in the 2 weeks post procedure, please call and report this information to [place].    If any biopsies were taken you will be contacted by phone or by letter within the next 1-3 weeks.  Please call us at [phone] if you have not heard about the biopsies in 3 weeks.    SIGNATURES/CONFIDENTIALITY: You and/or your care partner have signed paperwork which will be entered into your electronic medical record.  These signatures attest to the fact that that the information above on your After Visit Summary has been reviewed and is understood.  Full responsibility of the confidentiality of this discharge information lies with you and/or your care-partner.

## 2020-06-06 ENCOUNTER — Telehealth: Payer: Self-pay

## 2020-06-06 NOTE — Telephone Encounter (Signed)
First post procedure follow up call, no answer 

## 2020-06-06 NOTE — Telephone Encounter (Signed)
Second post procedure follow up call, no answer 

## 2020-06-12 ENCOUNTER — Other Ambulatory Visit: Payer: Medicare HMO

## 2020-06-12 ENCOUNTER — Other Ambulatory Visit: Payer: Self-pay

## 2020-06-12 ENCOUNTER — Ambulatory Visit: Payer: Medicare HMO

## 2020-06-12 DIAGNOSIS — B20 Human immunodeficiency virus [HIV] disease: Secondary | ICD-10-CM

## 2020-06-12 DIAGNOSIS — E782 Mixed hyperlipidemia: Secondary | ICD-10-CM

## 2020-06-13 LAB — T-HELPER CELL (CD4) - (RCID CLINIC ONLY)
CD4 % Helper T Cell: 31 % — ABNORMAL LOW (ref 33–65)
CD4 T Cell Abs: 743 /uL (ref 400–1790)

## 2020-06-15 LAB — COMPLETE METABOLIC PANEL WITH GFR
AG Ratio: 1.2 (calc) (ref 1.0–2.5)
ALT: 11 U/L (ref 9–46)
AST: 15 U/L (ref 10–35)
Albumin: 4.3 g/dL (ref 3.6–5.1)
Alkaline phosphatase (APISO): 115 U/L (ref 35–144)
BUN/Creatinine Ratio: 9 (calc) (ref 6–22)
BUN: 13 mg/dL (ref 7–25)
CO2: 28 mmol/L (ref 20–32)
Calcium: 10.3 mg/dL (ref 8.6–10.3)
Chloride: 97 mmol/L — ABNORMAL LOW (ref 98–110)
Creat: 1.45 mg/dL — ABNORMAL HIGH (ref 0.70–1.25)
GFR, Est African American: 59 mL/min/{1.73_m2} — ABNORMAL LOW (ref >=60)
GFR, Est Non African American: 51 mL/min/{1.73_m2} — ABNORMAL LOW (ref >=60)
Globulin: 3.7 g/dL (calc) (ref 1.9–3.7)
Glucose, Bld: 107 mg/dL — ABNORMAL HIGH (ref 65–99)
Potassium: 4.8 mmol/L (ref 3.5–5.3)
Sodium: 138 mmol/L (ref 135–146)
Total Bilirubin: 0.5 mg/dL (ref 0.2–1.2)
Total Protein: 8 g/dL (ref 6.1–8.1)

## 2020-06-15 LAB — CBC WITH DIFFERENTIAL/PLATELET
Absolute Monocytes: 946 cells/uL (ref 200–950)
Basophils Absolute: 137 cells/uL (ref 0–200)
Basophils Relative: 1.2 %
Eosinophils Absolute: 1961 cells/uL — ABNORMAL HIGH (ref 15–500)
Eosinophils Relative: 17.2 %
HCT: 53.9 % — ABNORMAL HIGH (ref 38.5–50.0)
Hemoglobin: 18.9 g/dL — ABNORMAL HIGH (ref 13.2–17.1)
Lymphs Abs: 2519 cells/uL (ref 850–3900)
MCH: 33.5 pg — ABNORMAL HIGH (ref 27.0–33.0)
MCHC: 35.1 g/dL (ref 32.0–36.0)
MCV: 95.4 fL (ref 80.0–100.0)
MPV: 9.8 fL (ref 7.5–12.5)
Monocytes Relative: 8.3 %
Neutro Abs: 5837 cells/uL (ref 1500–7800)
Neutrophils Relative %: 51.2 %
Platelets: 309 10*3/uL (ref 140–400)
RBC: 5.65 10*6/uL (ref 4.20–5.80)
RDW: 12.9 % (ref 11.0–15.0)
Total Lymphocyte: 22.1 %
WBC: 11.4 10*3/uL — ABNORMAL HIGH (ref 3.8–10.8)

## 2020-06-15 LAB — RPR: RPR Ser Ql: NONREACTIVE

## 2020-06-15 LAB — HIV-1 RNA QUANT-NO REFLEX-BLD
HIV 1 RNA Quant: <20 copies/mL
HIV-1 RNA Quant, Log: <1.3 Log copies/mL

## 2020-06-18 ENCOUNTER — Other Ambulatory Visit: Payer: Self-pay | Admitting: Infectious Disease

## 2020-06-18 DIAGNOSIS — B20 Human immunodeficiency virus [HIV] disease: Secondary | ICD-10-CM

## 2020-06-28 DIAGNOSIS — M1990 Unspecified osteoarthritis, unspecified site: Secondary | ICD-10-CM | POA: Diagnosis not present

## 2020-06-28 DIAGNOSIS — Z6833 Body mass index (BMI) 33.0-33.9, adult: Secondary | ICD-10-CM | POA: Diagnosis not present

## 2020-06-28 DIAGNOSIS — E11620 Type 2 diabetes mellitus with diabetic dermatitis: Secondary | ICD-10-CM | POA: Diagnosis not present

## 2020-06-28 DIAGNOSIS — E669 Obesity, unspecified: Secondary | ICD-10-CM | POA: Diagnosis not present

## 2020-06-28 DIAGNOSIS — E1140 Type 2 diabetes mellitus with diabetic neuropathy, unspecified: Secondary | ICD-10-CM | POA: Diagnosis not present

## 2020-06-28 DIAGNOSIS — G4700 Insomnia, unspecified: Secondary | ICD-10-CM | POA: Diagnosis not present

## 2020-06-28 DIAGNOSIS — Z791 Long term (current) use of non-steroidal anti-inflammatories (NSAID): Secondary | ICD-10-CM | POA: Diagnosis not present

## 2020-06-28 DIAGNOSIS — Z008 Encounter for other general examination: Secondary | ICD-10-CM | POA: Diagnosis not present

## 2020-06-28 DIAGNOSIS — Z7982 Long term (current) use of aspirin: Secondary | ICD-10-CM | POA: Diagnosis not present

## 2020-06-28 DIAGNOSIS — R69 Illness, unspecified: Secondary | ICD-10-CM | POA: Diagnosis not present

## 2020-06-28 DIAGNOSIS — K219 Gastro-esophageal reflux disease without esophagitis: Secondary | ICD-10-CM | POA: Diagnosis not present

## 2020-06-28 LAB — HM DIABETES EYE EXAM

## 2020-07-02 ENCOUNTER — Encounter: Payer: Medicare HMO | Admitting: Infectious Disease

## 2020-07-09 ENCOUNTER — Ambulatory Visit: Payer: Medicare HMO | Admitting: Infectious Disease

## 2020-07-09 ENCOUNTER — Encounter: Payer: Self-pay | Admitting: Infectious Disease

## 2020-07-09 ENCOUNTER — Other Ambulatory Visit: Payer: Self-pay

## 2020-07-09 VITALS — BP 137/82 | HR 82 | Temp 98.3°F | Wt 215.0 lb

## 2020-07-09 DIAGNOSIS — R7989 Other specified abnormal findings of blood chemistry: Secondary | ICD-10-CM | POA: Diagnosis not present

## 2020-07-09 DIAGNOSIS — D751 Secondary polycythemia: Secondary | ICD-10-CM | POA: Diagnosis not present

## 2020-07-09 DIAGNOSIS — IMO0002 Reserved for concepts with insufficient information to code with codable children: Secondary | ICD-10-CM

## 2020-07-09 DIAGNOSIS — B20 Human immunodeficiency virus [HIV] disease: Secondary | ICD-10-CM | POA: Diagnosis not present

## 2020-07-09 DIAGNOSIS — E119 Type 2 diabetes mellitus without complications: Secondary | ICD-10-CM | POA: Diagnosis not present

## 2020-07-09 DIAGNOSIS — R69 Illness, unspecified: Secondary | ICD-10-CM | POA: Diagnosis not present

## 2020-07-09 HISTORY — DX: Other specified abnormal findings of blood chemistry: R79.89

## 2020-07-09 HISTORY — DX: Reserved for concepts with insufficient information to code with codable children: IMO0002

## 2020-07-09 NOTE — Progress Notes (Signed)
Chief complaint: Follow-up for HIV disease  Subjective:    Patient ID: Hunter Pratt, adult    DOB: 1956/08/26, 64 y.o.   MRN: [id number]  HPI  64  Year old male who is doing superbly well on his antiviral regimen, of intelence, Isentress both QDaily and Descovy --> BIKTARVY\  He has done well and maintained perfect virological suppression.  He is trying to get his insurance to help him with obtaining a CPAP to treat his obstructive sleep apnea which is causing his secondary polycythemia   Past Medical History:  Diagnosis Date  . Abnormal white blood cell 01/27/2018  . Anemia    past hx   . Arthritis   . Constipation 01/28/2017  . Creatinine elevation 07/09/2020  . DDD (degenerative disc disease), cervical   . Diffuse abdominal pain 12/30/2018  . Ganglion cyst 01/28/2017  . GERD (gastroesophageal reflux disease)   . Hearing loss    Bilateral  . HIV infection ([place])   . Hyperlipidemia   . Kidney stone   . LLQ pain 12/28/2019  . OA (osteoarthritis) of knee   . Obesity 06/30/2019  . Osteoporosis   . PUD (peptic ulcer disease)   . Sleep apnea    dx'd no current cpap use  . Systolic dysfunction    EF 40-45%    Past Surgical History:  Procedure Laterality Date  . CERVICAL DISCECTOMY  1990 and 1991  . COLONOSCOPY    . GANGLION CYST EXCISION Left 03/18/2017   Procedure: REMOVAL GANGLION OF LEFT WRIST, TENOLYSIS;  Surgeon: Leandrew Koyanagi, MD;  Location: [facility];  Service: Orthopedics;  Laterality: Left;  . POLYPECTOMY      Family History  Problem Relation Age of Onset  . Hypertension Mother   . Osteoporosis Mother   . Osteoporosis Father   . Depression Father   . Hearing loss Father   . Colon polyps Father   . Osteoporosis Sister   . Arthritis Sister   . Hypertension Brother   . Hyperlipidemia Brother   . Depression Brother   . Colon cancer Neg Hx   . Rectal cancer Neg Hx   . Stomach cancer Neg Hx   . Esophageal cancer Neg Hx       Social  History   Socioeconomic History  . Marital status: Single    Spouse name: Not on file  . Number of children: Not on file  . Years of education: Not on file  . Highest education level: Not on file  Occupational History  . Not on file  Tobacco Use  . Smoking status: Former Smoker    Packs/day: 1.00    Years: 20.00    Pack years: 20.00    Types: Cigarettes    Quit date: 2008    Years since quitting: 13.6  . Smokeless tobacco: Never Used  Vaping Use  . Vaping Use: Never used  Substance and Sexual Activity  . Alcohol use: Not Currently  . Drug use: No  . Sexual activity: Not Currently    Comment: refused condoms  Other Topics Concern  . Not on file  Social History Narrative  . Not on file   Social Determinants of Health   Financial Resource Strain:   . Difficulty of Paying Living Expenses:   Food Insecurity:   . Worried About Charity fundraiser in the Last Year:   . Arboriculturist in the Last Year:   Transportation Needs:   .  Lack of Transportation (Medical):   Marland Kitchen Lack of Transportation (Non-Medical):   Physical Activity:   . Days of Exercise per Week:   . Minutes of Exercise per Session:   Stress:   . Feeling of Stress :   Social Connections:   . Frequency of Communication with Friends and Family:   . Frequency of Social Gatherings with Friends and Family:   . Attends Religious Services:   . Active Member of Clubs or Organizations:   . Attends Archivist Meetings:   Marland Kitchen Marital Status:     Allergies  Allergen Reactions  . Naproxen Sodium     REACTION: kidney failure  . Nsaids     REACTION: GI upset  . Other Rash    Black dye     Current Outpatient Medications:  .  aspirin 325 MG tablet, Take 325 mg by mouth daily. , Disp: , Rfl:  .  aspirin EC 81 MG tablet, Take 81 mg by mouth daily. Swallow whole., Disp: , Rfl:  .  BIKTARVY 50-200-25 MG TABS tablet, TAKE 1 TABLET BY MOUTH DAILY, Disp: 30 tablet, Rfl: 5 .  famotidine (PEPCID) 20 MG tablet,  Take 1 tablet (20 mg total) by mouth at bedtime., Disp: , Rfl:  .  fluocinonide ointment (LIDEX) 0.05 %, APPLY EXTERNALLY TO THE AFFECTED AREA TWICE DAILY, Disp: 30 g, Rfl: 1 .  omeprazole (PRILOSEC) 40 MG capsule, Take 1 capsule 20-30 minutes before breakfast each day., Disp: 30 capsule, Rfl: 3 .  traZODone (DESYREL) 50 MG tablet, TAKE 1 TABLET(50 MG) BY MOUTH AT BEDTIME AS NEEDED FOR SLEEP, Disp: 90 tablet, Rfl: 3  Review of Systems  Constitutional: Negative for chills and fever.  HENT: Negative for congestion and sore throat.   Eyes: Negative for photophobia.  Respiratory: Negative for cough, shortness of breath and wheezing.   Cardiovascular: Negative for chest pain, palpitations and leg swelling.  Gastrointestinal: Negative for blood in stool, nausea, rectal pain and vomiting.  Genitourinary: Negative for dysuria, flank pain and hematuria.  Musculoskeletal: Negative for back pain and myalgias.  Skin: Negative for rash.  Neurological: Negative for dizziness, weakness and headaches.  Hematological: Does not bruise/bleed easily.  Psychiatric/Behavioral: Negative for agitation, confusion, decreased concentration, dysphoric mood, hallucinations and suicidal ideas.       Objective:   Physical Exam Vitals and nursing note reviewed.  Constitutional:      General: He is not in acute distress.    Appearance: He is well-developed. He is not diaphoretic.  HENT:     Head: Normocephalic and atraumatic.     Mouth/Throat:     Pharynx: No oropharyngeal exudate.  Eyes:     General: No scleral icterus.    Conjunctiva/sclera: Conjunctivae normal.  Cardiovascular:     Rate and Rhythm: Normal rate and regular rhythm.     Heart sounds: Normal heart sounds.  Pulmonary:     Effort: Pulmonary effort is normal.     Breath sounds: Normal breath sounds. No wheezing or rales.  Abdominal:     General: Bowel sounds are normal. There is no distension.     Palpations: Abdomen is soft. Abdomen is not  rigid.     Tenderness: There is no guarding or rebound.  Musculoskeletal:        General: No tenderness.     Cervical back: Normal range of motion and neck supple.  Skin:    General: Skin is warm and dry.     Coloration: Skin is not pale.  Findings: No erythema or rash.  Neurological:     Mental Status: He is alert and oriented to person, place, and time.     Motor: No abnormal muscle tone.     Coordination: Coordination normal.  Psychiatric:        Attention and Perception: Attention and perception normal.        Mood and Affect: Mood normal.        Behavior: Behavior normal.        Thought Content: Thought content normal.        Cognition and Memory: Cognition and memory normal.        Judgment: Judgment normal.     Assessment & Plan:    HIV: continue  BIKTARVY, return to clinic in 6 months t in January for renewal of his SPAP  HTN well-controlled though higher in clinic Vitals:   07/09/20 0900  BP: 137/82  Pulse: 82  Temp: 98.3 F (36.8 C)     Hyperlipidemia: followed by PCP,   Secondary polycythemia: This is undoubtedly due to obstructive sleep apnea  Obstructive sleep apnea : Hopefully he will get eventually fitted with a CPAP and we can start having this underlying condition treated  Obesity weight loss would be very helpful for his obstructive sleep apnea his diabetes and also reducing his risk for novel coronavirus 2019   Elevated serum creatinine: We will check repeat metabolic panel as well as a FENa

## 2020-07-10 LAB — BASIC METABOLIC PANEL WITHOUT GFR
BUN: 12 mg/dL (ref 7–25)
CO2: 28 mmol/L (ref 20–32)
Calcium: 9.7 mg/dL (ref 8.6–10.3)
Chloride: 101 mmol/L (ref 98–110)
Creat: 1.2 mg/dL (ref 0.70–1.25)
GFR, Est African American: 75 mL/min/1.73m2
GFR, Est Non African American: 64 mL/min/1.73m2
Glucose, Bld: 133 mg/dL — ABNORMAL HIGH (ref 65–99)
Potassium: 4.1 mmol/L (ref 3.5–5.3)
Sodium: 136 mmol/L (ref 135–146)

## 2020-07-10 LAB — CREATININE, URINE, RANDOM: Creatinine, Urine: 210 mg/dL (ref 20–320)

## 2020-08-02 ENCOUNTER — Encounter: Payer: Self-pay | Admitting: Infectious Disease

## 2020-09-05 ENCOUNTER — Ambulatory Visit (INDEPENDENT_AMBULATORY_CARE_PROVIDER_SITE_OTHER): Payer: Medicare HMO | Admitting: Family Medicine

## 2020-09-05 ENCOUNTER — Encounter: Payer: Self-pay | Admitting: Family Medicine

## 2020-09-05 ENCOUNTER — Other Ambulatory Visit: Payer: Self-pay

## 2020-09-05 VITALS — BP 124/68 | HR 92 | Temp 98.7°F | Resp 16 | Ht 68.0 in | Wt 212.0 lb

## 2020-09-05 DIAGNOSIS — G609 Hereditary and idiopathic neuropathy, unspecified: Secondary | ICD-10-CM

## 2020-09-05 DIAGNOSIS — I10 Essential (primary) hypertension: Secondary | ICD-10-CM

## 2020-09-05 DIAGNOSIS — M4802 Spinal stenosis, cervical region: Secondary | ICD-10-CM

## 2020-09-05 DIAGNOSIS — Z125 Encounter for screening for malignant neoplasm of prostate: Secondary | ICD-10-CM | POA: Diagnosis not present

## 2020-09-05 DIAGNOSIS — R252 Cramp and spasm: Secondary | ICD-10-CM | POA: Diagnosis not present

## 2020-09-05 DIAGNOSIS — Z23 Encounter for immunization: Secondary | ICD-10-CM | POA: Diagnosis not present

## 2020-09-05 DIAGNOSIS — Z9889 Other specified postprocedural states: Secondary | ICD-10-CM

## 2020-09-05 DIAGNOSIS — M5030 Other cervical disc degeneration, unspecified cervical region: Secondary | ICD-10-CM | POA: Diagnosis not present

## 2020-09-05 DIAGNOSIS — R69 Illness, unspecified: Secondary | ICD-10-CM | POA: Diagnosis not present

## 2020-09-05 DIAGNOSIS — E119 Type 2 diabetes mellitus without complications: Secondary | ICD-10-CM | POA: Diagnosis not present

## 2020-09-05 DIAGNOSIS — E782 Mixed hyperlipidemia: Secondary | ICD-10-CM | POA: Diagnosis not present

## 2020-09-05 DIAGNOSIS — G4700 Insomnia, unspecified: Secondary | ICD-10-CM | POA: Diagnosis not present

## 2020-09-05 DIAGNOSIS — K219 Gastro-esophageal reflux disease without esophagitis: Secondary | ICD-10-CM

## 2020-09-05 DIAGNOSIS — E669 Obesity, unspecified: Secondary | ICD-10-CM

## 2020-09-05 MED ORDER — BLOOD GLUCOSE TEST VI STRP: ORAL_STRIP | 1 refills | Status: AC

## 2020-09-05 MED ORDER — BLOOD GLUCOSE SYSTEM PAK KIT: PACK | 1 refills | Status: AC

## 2020-09-05 MED ORDER — LANCETS MISC
1 refills | Status: AC
Start: 2020-09-05 — End: ?

## 2020-09-05 MED ORDER — TRAMADOL HCL 50 MG PO TABS: 50.0000 mg | ORAL_TABLET | Freq: Three times a day (TID) | ORAL | 1 refills | Status: AC | PRN

## 2020-09-05 MED ORDER — OMEPRAZOLE 40 MG PO CPDR: DELAYED_RELEASE_CAPSULE | ORAL | 3 refills | Status: AC

## 2020-09-05 NOTE — Assessment & Plan Note (Signed)
Blood pressure controlled no change in medication.

## 2020-09-05 NOTE — Patient Instructions (Addendum)
Magnesium tablet 250mg  once a day  Increase water We will call with lab results Flu shot done  Ultram for pain  CT scan to be done of neck  F/U 4 months wellness visit

## 2020-09-05 NOTE — Progress Notes (Addendum)
Subjective:    Patient ID: Hunter Pratt, adult    DOB: 01/24/1956, 64 y.o.   MRN: [id number]  Patient presents for Neck Pain (x weeks- L sided pain, especially when in bed)  DM- no current meds, needs new meter with strips etc, last A1C 5.8% in Feb He had a retina scan  Due for urine micro   He has been following with the Pulmonary, advised to sleep on his side, but when he started doing it, started having left sided neck pain, feels like a pressure, he does get HA from neck up at times, he does have pain that radiates to upper arms  He has bone spurs in neck, history of spinal stenosis.  Has been taking Tylenol and Advil but these have not helped very much. He has had C spine surgery > 20 years ago   There is concen for sleep apnea but insurance will not pay for it   Hyperlipidemia due for lipids  Flu shot due  Review Of Systems:  GEN- denies fatigue, fever, weight loss,weakness, recent illness HEENT- denies eye drainage, change in vision, nasal discharge, CVS- denies chest pain, palpitations RESP- denies SOB, cough, wheeze ABD- denies N/V, change in stools, abd pain GU- denies dysuria, hematuria, dribbling, incontinence MSK- + joint pain, muscle aches, injury Neuro- denies headache, dizziness, syncope, seizure activity       Objective:    BP 124/68   Pulse 92   Temp 98.7 F (37.1 C) (Temporal)   Resp 16   Ht 5\' 8"  (1.727 m)   Wt 212 lb (96.2 kg)   SpO2 97%   BMI 32.23 kg/m  GEN- NAD, alert and oriented x3 HEENT- PERRL, EOMI, non injected sclera, pink conjunctiva, MMM, oropharynx clear Neck- Supple, fai rROM, TTP C spine and left paraspinals, neg spurlings CVS- RRR, no murmur RESP-CTAB NEURO- Good ROM upper ext, sensation grossly in tact UE, tone in tact  EXT- No edema Pulses- Radial, DP- 2+        Assessment & Plan:      I spoke with pt, his insurance denied the CT scan, due to time period from surgery, he needed xray, 6 weeks of conservative treatment  documented, PT etc  after further discussion will send to his orthopedist  he has ultram on hand  will cancel CT   Problem List Items Addressed This Visit      Unprioritized   DDD (degenerative disc disease), cervical    Worsening neck pain from his known degenerative disc disease he has had intervention in the past on his spine.  Will obtain CT of neck to further evaluate.  We will start him on Ultram as needed pain      Relevant Medications   traMADol (ULTRAM) 50 MG tablet   Diabetes type 2, controlled ([place])    Currently diet controlled recheck A1c urine micro.       Relevant Orders   [facility] DIABETES FOOT EXAM (Completed)   CBC with Differential/Platelet   Lipid panel   Comprehensive metabolic panel   Hemoglobin A1c   Microalbumin / creatinine urine ratio   GERD    Meds refilled he benefits from PPI. he has history of peptic ulcer disease as well.      Relevant Medications   omeprazole (PRILOSEC) 40 MG capsule   Hereditary and idiopathic peripheral neuropathy   HTN (hypertension)    Blood pressure controlled no change in medication.      Hyperlipidemia   Relevant Orders  Lipid panel   Insomnia   Obesity (BMI 30.0-34.9)    Other Visit Diagnoses    Need for immunization against influenza    -  Primary   Relevant Orders   Flu Vaccine QUAD 36+ mos IM (Completed)   Leg cramps       Spinal stenosis, cervical region       Prostate cancer screening       Relevant Orders   PSA      Note: This dictation was prepared with Dragon dictation along with smaller phrase technology. Any transcriptional errors that result from this process are unintentional.

## 2020-09-05 NOTE — Assessment & Plan Note (Signed)
Currently diet controlled recheck A1c urine micro.

## 2020-09-05 NOTE — Assessment & Plan Note (Signed)
Worsening neck pain from his known degenerative disc disease he has had intervention in the past on his spine.  Will obtain CT of neck to further evaluate.  We will start him on Ultram as needed pain

## 2020-09-05 NOTE — Assessment & Plan Note (Signed)
Meds refilled he benefits from PPI. he has history of peptic ulcer disease as well.

## 2020-09-06 LAB — CBC WITH DIFFERENTIAL/PLATELET
Absolute Monocytes: 819 cells/uL (ref 200–950)
Basophils Absolute: 153 cells/uL (ref 0–200)
Basophils Relative: 1.7 %
Eosinophils Absolute: 1728 cells/uL — ABNORMAL HIGH (ref 15–500)
Eosinophils Relative: 19.2 %
HCT: 51.9 % — ABNORMAL HIGH (ref 38.5–50.0)
Hemoglobin: 18.2 g/dL — ABNORMAL HIGH (ref 13.2–17.1)
Lymphs Abs: 1962 cells/uL (ref 850–3900)
MCH: 33.7 pg — ABNORMAL HIGH (ref 27.0–33.0)
MCHC: 35.1 g/dL (ref 32.0–36.0)
MCV: 96.1 fL (ref 80.0–100.0)
MPV: 10 fL (ref 7.5–12.5)
Monocytes Relative: 9.1 %
Neutro Abs: 4338 cells/uL (ref 1500–7800)
Neutrophils Relative %: 48.2 %
Platelets: 306 10*3/uL (ref 140–400)
RBC: 5.4 10*6/uL (ref 4.20–5.80)
RDW: 12.9 % (ref 11.0–15.0)
Total Lymphocyte: 21.8 %
WBC: 9 10*3/uL (ref 3.8–10.8)

## 2020-09-06 LAB — COMPREHENSIVE METABOLIC PANEL
AG Ratio: 1.3 (calc) (ref 1.0–2.5)
ALT: 12 U/L (ref 9–46)
AST: 18 U/L (ref 10–35)
Albumin: 4.3 g/dL (ref 3.6–5.1)
Alkaline phosphatase (APISO): 102 U/L (ref 35–144)
BUN/Creatinine Ratio: 12 (calc) (ref 6–22)
BUN: 15 mg/dL (ref 7–25)
CO2: 27 mmol/L (ref 20–32)
Calcium: 10.1 mg/dL (ref 8.6–10.3)
Chloride: 100 mmol/L (ref 98–110)
Creat: 1.27 mg/dL — ABNORMAL HIGH (ref 0.70–1.25)
Globulin: 3.4 g/dL (calc) (ref 1.9–3.7)
Glucose, Bld: 102 mg/dL — ABNORMAL HIGH (ref 65–99)
Potassium: 4.6 mmol/L (ref 3.5–5.3)
Sodium: 137 mmol/L (ref 135–146)
Total Bilirubin: 0.5 mg/dL (ref 0.2–1.2)
Total Protein: 7.7 g/dL (ref 6.1–8.1)

## 2020-09-06 LAB — LIPID PANEL
Cholesterol: 208 mg/dL — ABNORMAL HIGH (ref <200)
HDL: 40 mg/dL (ref >=40)
LDL Cholesterol (Calc): 125 mg/dL (calc) — ABNORMAL HIGH
Non-HDL Cholesterol (Calc): 168 mg/dL (calc) — ABNORMAL HIGH (ref <130)
Total CHOL/HDL Ratio: 5.2 (calc) — ABNORMAL HIGH (ref <5.0)
Triglycerides: 280 mg/dL — ABNORMAL HIGH (ref <150)

## 2020-09-06 LAB — HEMOGLOBIN A1C
Hgb A1c MFr Bld: 5.9 % of total Hgb — ABNORMAL HIGH (ref <5.7)
Mean Plasma Glucose: 123 (calc)
eAG (mmol/L): 6.8 (calc)

## 2020-09-06 LAB — MICROALBUMIN / CREATININE URINE RATIO
Creatinine, Urine: 167 mg/dL (ref 20–320)
Microalb Creat Ratio: 8 mcg/mg creat (ref <30)
Microalb, Ur: 1.4 mg/dL

## 2020-09-06 LAB — PSA: PSA: 0.24 ng/mL (ref <=4.0)

## 2020-09-12 NOTE — Addendum Note (Signed)
Addended by: Vic Blackbird F on: 09/12/2020 08:47 AM   Modules accepted: Orders

## 2020-09-13 ENCOUNTER — Other Ambulatory Visit: Payer: Self-pay | Admitting: *Deleted

## 2020-09-13 ENCOUNTER — Ambulatory Visit: Payer: Medicare HMO | Admitting: Pulmonary Disease

## 2020-09-13 MED ORDER — ATORVASTATIN CALCIUM 20 MG PO TABS: 20.0000 mg | ORAL_TABLET | Freq: Every day | ORAL | 3 refills | Status: AC

## 2020-09-17 ENCOUNTER — Encounter: Payer: Self-pay | Admitting: Pulmonary Disease

## 2020-09-17 ENCOUNTER — Other Ambulatory Visit: Payer: Self-pay

## 2020-09-17 ENCOUNTER — Ambulatory Visit: Payer: Medicare HMO | Admitting: Pulmonary Disease

## 2020-09-17 VITALS — BP 110/74 | HR 81 | Temp 98.0°F | Ht 68.0 in | Wt 217.8 lb

## 2020-09-17 DIAGNOSIS — G4733 Obstructive sleep apnea (adult) (pediatric): Secondary | ICD-10-CM

## 2020-09-17 NOTE — Progress Notes (Signed)
Subjective:    Patient ID: Hunter Pratt, adult    DOB: [birth year], 64 y.o.   MRN: [id number]  Patient with concern for obstructive sleep apnea  Stated he had sleep study performed in 2010 He recollects not being able to sleep at all during that study but he was told that he has obstructive sleep apnea He was started on CPAP at the time-used it for 1 year and just never got used to using it  Still sleeping poorly Has occasional insomnia Overall sleeping better compared to previously  Denies any history of lung disease Smoked in the past, quit between 2094 and 7096 Illicit drug use in the past Is HIV positive-followed by Dr. Tommy Medal  Admits to snoring Occasional dryness of his mouth in the mornings Denies headaches in the mornings Feels his memory is fine  Admits to not being able to get his mind to shut down at night Usually tries to go to bed about 11 PM Takes him about an hour plus to fall asleep About 2 awakenings during the night Wakes up between 5-9 AM in the morning    Past Medical History:  Diagnosis Date  . Abnormal white blood cell 01/27/2018  . Anemia    past hx   . Arthritis   . Constipation 01/28/2017  . Creatinine elevation 07/09/2020  . DDD (degenerative disc disease), cervical   . Diffuse abdominal pain 12/30/2018  . Ganglion cyst 01/28/2017  . GERD (gastroesophageal reflux disease)   . Hearing loss    Bilateral  . HIV infection ([place])   . Hyperlipidemia   . Kidney stone   . LLQ pain 12/28/2019  . OA (osteoarthritis) of knee   . Obesity 06/30/2019  . Osteoporosis   . PUD (peptic ulcer disease)   . Sleep apnea    dx'd no current cpap use  . Systolic dysfunction    EF 40-45%   Social History   Socioeconomic History  . Marital status: Single    Spouse name: Not on file  . Number of children: Not on file  . Years of education: Not on file  . Highest education level: Not on file  Occupational History  . Not on file  Tobacco Use  . Smoking  status: Former Smoker    Packs/day: 1.00    Years: 20.00    Pack years: 20.00    Types: Cigarettes    Quit date: 2008    Years since quitting: 13.8  . Smokeless tobacco: Never Used  Vaping Use  . Vaping Use: Never used  Substance and Sexual Activity  . Alcohol use: Not Currently  . Drug use: No  . Sexual activity: Not Currently    Comment: refused condoms  Other Topics Concern  . Not on file  Social History Narrative  . Not on file   Social Determinants of Health   Financial Resource Strain:   . Difficulty of Paying Living Expenses: Not on file  Food Insecurity:   . Worried About Charity fundraiser in the Last Year: Not on file  . Ran Out of Food in the Last Year: Not on file  Transportation Needs:   . Lack of Transportation (Medical): Not on file  . Lack of Transportation (Non-Medical): Not on file  Physical Activity:   . Days of Exercise per Week: Not on file  . Minutes of Exercise per Session: Not on file  Stress:   . Feeling of Stress : Not on file  Social Connections:   .  Frequency of Communication with Friends and Family: Not on file  . Frequency of Social Gatherings with Friends and Family: Not on file  . Attends Religious Services: Not on file  . Active Member of Clubs or Organizations: Not on file  . Attends Archivist Meetings: Not on file  . Marital Status: Not on file  Intimate Partner Violence:   . Fear of Current or Ex-Partner: Not on file  . Emotionally Abused: Not on file  . Physically Abused: Not on file  . Sexually Abused: Not on file   Family History  Problem Relation Age of Onset  . Hypertension Mother   . Osteoporosis Mother   . Osteoporosis Father   . Depression Father   . Hearing loss Father   . Colon polyps Father   . Osteoporosis Sister   . Arthritis Sister   . Hypertension Brother   . Hyperlipidemia Brother   . Depression Brother   . Colon cancer Neg Hx   . Rectal cancer Neg Hx   . Stomach cancer Neg Hx   .  Esophageal cancer Neg Hx    Review of Systems  Respiratory: Positive for apnea.   Gastrointestinal: Positive for abdominal pain.  Musculoskeletal: Positive for arthralgias.  Psychiatric/Behavioral: Positive for sleep disturbance.      Objective:   Physical Exam Constitutional:      Appearance: Normal appearance.  HENT:     Head: Normocephalic and atraumatic.     Mouth/Throat:     Comments: Mallampati 4, crowded oropharynx Eyes:     General:        Right eye: No discharge.        Left eye: No discharge.  Cardiovascular:     Rate and Rhythm: Normal rate and regular rhythm.     Pulses: Normal pulses.     Heart sounds: Normal heart sounds. No murmur heard.  No friction rub.  Pulmonary:     Effort: Pulmonary effort is normal. No respiratory distress.     Breath sounds: Normal breath sounds. No stridor. No wheezing or rhonchi.  Musculoskeletal:     Cervical back: No rigidity or tenderness.  Neurological:     Mental Status: He is alert.  Psychiatric:        Mood and Affect: Mood normal.    Vitals:   09/17/20 1350  BP: 110/74  Pulse: 81  Temp: 98 F (36.7 C)  SpO2: 97%   Results of the Epworth flowsheet 09/17/2020 01/12/2020  Sitting and reading 1 0  Watching TV 1 0  Sitting, inactive in a public place (e.g. a theatre or a meeting) 0 0  As a passenger in a car for an hour without a break 0 0  Lying down to rest in the afternoon when circumstances permit 3 2  Sitting and talking to someone 0 0  Sitting quietly after a lunch without alcohol 1 1  In a car, while stopped for a few minutes in traffic 0 0  Total score 6 3   Previous sleep study from 2010 is not available    Assessment & Plan:  .  Multifactorial reasons for polycythemia .  Agreeable to undergo split-night study  .  Schedule patient for split-night study  .  Pathophysiology of sleep disordered breathing discussed with the patient Treatment options for sleep disordered breathing discussed with the  patient Risk of not treating sleep disordered breathing discussed  Follow-up in 3 months  Sherrilyn Rist, MD [facility] Pager: [phone]

## 2020-09-17 NOTE — Patient Instructions (Signed)
We will schedule you for a split-night study  We will try to find out what happened with adapt regarding the overnight oximetry  Tentative follow-up in 3 months  Call with significant concerns

## 2020-09-20 ENCOUNTER — Ambulatory Visit (INDEPENDENT_AMBULATORY_CARE_PROVIDER_SITE_OTHER): Payer: Medicare HMO | Admitting: Orthopaedic Surgery

## 2020-09-20 ENCOUNTER — Ambulatory Visit: Payer: Self-pay

## 2020-09-20 ENCOUNTER — Encounter: Payer: Self-pay | Admitting: Orthopaedic Surgery

## 2020-09-20 ENCOUNTER — Other Ambulatory Visit: Payer: Self-pay

## 2020-09-20 VITALS — BP 104/65 | HR 70 | Ht 68.0 in | Wt 212.0 lb

## 2020-09-20 DIAGNOSIS — M542 Cervicalgia: Secondary | ICD-10-CM | POA: Diagnosis not present

## 2020-09-20 DIAGNOSIS — M25561 Pain in right knee: Secondary | ICD-10-CM | POA: Diagnosis not present

## 2020-09-20 DIAGNOSIS — M170 Bilateral primary osteoarthritis of knee: Secondary | ICD-10-CM

## 2020-09-20 DIAGNOSIS — M25562 Pain in left knee: Secondary | ICD-10-CM | POA: Diagnosis not present

## 2020-09-20 DIAGNOSIS — G8929 Other chronic pain: Secondary | ICD-10-CM

## 2020-09-20 MED ORDER — METHYLPREDNISOLONE ACETATE 40 MG/ML IJ SUSP
40.0000 mg | INTRAMUSCULAR | Status: AC | PRN
  Administered 2020-09-20: 40 mg via INTRA_ARTICULAR

## 2020-09-20 MED ORDER — LIDOCAINE HCL 1 % IJ SOLN
0.5000 mL | INTRAMUSCULAR | Status: AC | PRN
  Administered 2020-09-20: .5 mL

## 2020-09-20 MED ORDER — BUPIVACAINE HCL 0.5 % IJ SOLN
3.0000 mL | INTRAMUSCULAR | Status: AC | PRN
  Administered 2020-09-20: 3 mL via INTRA_ARTICULAR

## 2020-09-20 MED ORDER — BUPIVACAINE HCL 0.25 % IJ SOLN
4.0000 mL | INTRAMUSCULAR | Status: AC | PRN
  Administered 2020-09-20: 4 mL via INTRA_ARTICULAR

## 2020-09-20 NOTE — Progress Notes (Signed)
Office Visit Note   Patient: Hunter Pratt           Date of Birth: [birth year]           MRN: [id number] Visit Date: 09/20/2020              Requested by: Alycia Rossetti, MD [address] [place],  [place] [address] PCP: Alycia Rossetti, MD   Assessment & Plan: Visit Diagnoses:  1. Neck pain   2. Chronic pain of both knees   3. Bilateral primary osteoarthritis of knee     Plan: Right  And left knee injection performed which had done well for him in 2019.  We reviewed his cervical spine images.  I will recheck him in 5 weeks.  Follow-Up Instructions: Return in about 5 weeks (around 10/25/2020).   Orders:  Orders Placed This Encounter  Procedures  . Large Joint Inj: R knee  . Large Joint Inj: L knee  . XR Cervical Spine 2 or 3 views  . XR KNEE 3 VIEW LEFT  . XR KNEE 3 VIEW RIGHT   No orders of the defined types were placed in this encounter.     Procedures: Large Joint Inj: R knee on 09/20/2020 10:12 AM Indications: pain and joint swelling Details: 22 G 1.5 in needle, anterolateral approach  Arthrogram: No  Medications: 40 mg methylPREDNISolone acetate 40 MG/ML; 0.5 mL lidocaine 1 %; 4 mL bupivacaine 0.25 % Outcome: tolerated well, no immediate complications Procedure, treatment alternatives, risks and benefits explained, specific risks discussed. Consent was given by the patient. Immediately prior to procedure a time out was called to verify the correct patient, procedure, equipment, support staff and site/side marked as required. Patient was prepped and draped in the usual sterile fashion.   Large Joint Inj: L knee on 09/20/2020 10:12 AM Indications: joint swelling and pain Details: 22 G 1.5 in needle, anterolateral approach  Arthrogram: No  Medications: 0.5 mL lidocaine 1 %; 3 mL bupivacaine 0.5 %; 40 mg methylPREDNISolone acetate 40 MG/ML Outcome: tolerated well, no immediate complications Procedure, treatment alternatives, risks and benefits  explained, specific risks discussed. Consent was given by the patient. Immediately prior to procedure a time out was called to verify the correct patient, procedure, equipment, support staff and site/side marked as required. Patient was prepped and draped in the usual sterile fashion.       Clinical Data: No additional findings.   Subjective: Chief Complaint  Patient presents with  . Neck - Pain  . Left Knee - Pain  . Right Knee - Pain    HPI 64 year old male with bilateral knee pain currently worse than the left sometimes worse on the right.  He is also had problems with pain in his neck for 3 years had previous C4-5 fusion by Dr. Joya Salm and then C5-6 done in 1991 1 year later.  He has had symptoms for 3 weeks he has been taking tramadol 1 3 times daily.  He has some problems walking with aching in his knees swelling problems with stairs.  His arms are numb wakes him up at night he does not normally have to shake his hand he states this is his entire arm when he sits position is able to fall asleep.  During the day has not really noticed arms being painful or numb.  He does have type 2 diabetes history of GERD hypertension and HIV.  Review of Systems all other systems noncontributory to HPI.  Objective: Vital Signs: BP 104/65   Pulse 70   Ht 5\' 8"  (1.727 m)   Wt 212 lb (96.2 kg)   BMI 32.23 kg/m   Physical Exam Constitutional:      Appearance: He is well-developed.  HENT:     Head: Normocephalic and atraumatic.  Eyes:     Pupils: Pupils are equal, round, and reactive to light.  Neck:     Thyroid: No thyromegaly.     Trachea: No tracheal deviation.  Cardiovascular:     Rate and Rhythm: Normal rate.  Pulmonary:     Effort: Pulmonary effort is normal.     Breath sounds: No wheezing.  Abdominal:     General: Bowel sounds are normal.     Palpations: Abdomen is soft.  Skin:    General: Skin is warm and dry.     Capillary Refill: Capillary refill takes less than 2  seconds.  Neurological:     Mental Status: He is alert and oriented to person, place, and time.  Psychiatric:        Behavior: Behavior normal.        Thought Content: Thought content normal.        Judgment: Judgment normal.     Ortho Exam bilateral knee crepitus.  He has brachial plexus tenderness increased discomfort forward flexion lacks 3 fingerbreadths chin to chest.  Normal heel toe gait.  He can get on and off the exam table walks with a short stride gait and right and left knee limp.  Distal pulses are intact.  Specialty Comments:  No specialty comments available.  Imaging: XR Cervical Spine 2 or 3 views  Result Date: 09/20/2020 AP lateral cervical spine images are obtained and reviewed.  This shows solid interbody fusion at C5-6.  Burring and suggested no pseudoarthrosis at C4-5 with uncovertebral spurring hypertrophy both right and left.  There is endplate spurring anterior posteriorly at C6-7 and mild at C3-4 above the previous fusion levels. Impression: Previous cervical fusion C4-C6 with autograft without plate.  Question pseudoarthrosis C4-5 adjacent degenerative changes as described above.  XR KNEE 3 VIEW LEFT  Result Date: 09/20/2020 Three-view x-rays left knee obtained reviewed he has patellofemoral arthritis and medial more than lateral joint line narrowing with small osteophyte formation. Impression: Left knee mild to moderate knee osteoarthritis.  XR KNEE 3 VIEW RIGHT  Result Date: 09/20/2020 Standing AP lateral sunrise patellar x-ray demonstrates right knee 1 to 2 mm medial joint space.  Some osteophyte formation no subchondral cyst formation. Impression: Moderate knee osteoarthritis worse in the medial compartment right knee.    PMFS History: Patient Active Problem List   Diagnosis Date Noted  . Bilateral primary osteoarthritis of knee 09/20/2020  . Creatinine elevation 07/09/2020  . Obesity (BMI 30.0-34.9) 05/22/2019  . Mastalgia 06/29/2018  .  Tenosynovitis of left wrist 03/18/2017  . Constipation 01/28/2017  . Insomnia 03/03/2014  . Cervical spondylosis with radiculopathy 09/14/2013  . DDD (degenerative disc disease), cervical 08/11/2013  . HTN (hypertension) 07/27/2013  . Lymphadenopathy 03/04/2013  . Eczematous dermatitis 11/14/2012  . Systolic dysfunction 73/41/9379  . Diabetes type 2, controlled ([place]) 07/30/2012  . Lower extremity edema 06/09/2012  . OSA (obstructive sleep apnea) 06/09/2012  . DEGENERATIVE JOINT DISEASE, KNEES, BILATERAL 01/30/2009  . Secondary polycythemia 05/22/2008  . Hyperlipidemia 04/21/2008  . GERD 04/21/2008  . MOOD DISORDER IN CONDITIONS CLASSIFIED ELSEWHERE 02/28/2008  . Hereditary and idiopathic peripheral neuropathy 02/28/2008  . PUD 01/03/2008  . Human immunodeficiency virus (HIV) disease (  [place]) 12/28/2006   Past Medical History:  Diagnosis Date  . Abnormal white blood cell 01/27/2018  . Anemia    past hx   . Arthritis   . Constipation 01/28/2017  . Creatinine elevation 07/09/2020  . DDD (degenerative disc disease), cervical   . Diffuse abdominal pain 12/30/2018  . Ganglion cyst 01/28/2017  . GERD (gastroesophageal reflux disease)   . Hearing loss    Bilateral  . HIV infection ([place])   . Hyperlipidemia   . Kidney stone   . LLQ pain 12/28/2019  . OA (osteoarthritis) of knee   . Obesity 06/30/2019  . Osteoporosis   . PUD (peptic ulcer disease)   . Sleep apnea    dx'd no current cpap use  . Systolic dysfunction    EF 40-45%    Family History  Problem Relation Age of Onset  . Hypertension Mother   . Osteoporosis Mother   . Osteoporosis Father   . Depression Father   . Hearing loss Father   . Colon polyps Father   . Osteoporosis Sister   . Arthritis Sister   . Hypertension Brother   . Hyperlipidemia Brother   . Depression Brother   . Colon cancer Neg Hx   . Rectal cancer Neg Hx   . Stomach cancer Neg Hx   . Esophageal cancer Neg Hx     Past Surgical History:  Procedure  Laterality Date  . CERVICAL DISCECTOMY  1990 and 1991  . COLONOSCOPY    . GANGLION CYST EXCISION Left 03/18/2017   Procedure: REMOVAL GANGLION OF LEFT WRIST, TENOLYSIS;  Surgeon: Leandrew Koyanagi, MD;  Location: [facility];  Service: Orthopedics;  Laterality: Left;  . POLYPECTOMY     Social History   Occupational History  . Not on file  Tobacco Use  . Smoking status: Former Smoker    Packs/day: 1.00    Years: 20.00    Pack years: 20.00    Types: Cigarettes    Quit date: 2008    Years since quitting: 13.8  . Smokeless tobacco: Never Used  Vaping Use  . Vaping Use: Never used  Substance and Sexual Activity  . Alcohol use: Not Currently  . Drug use: No  . Sexual activity: Not Currently    Comment: refused condoms

## 2020-09-21 ENCOUNTER — Other Ambulatory Visit: Payer: Self-pay | Admitting: Gastroenterology

## 2020-09-25 ENCOUNTER — Ambulatory Visit: Payer: Medicare HMO | Admitting: Family Medicine

## 2020-10-10 ENCOUNTER — Other Ambulatory Visit: Payer: Self-pay

## 2020-10-10 ENCOUNTER — Ambulatory Visit (HOSPITAL_BASED_OUTPATIENT_CLINIC_OR_DEPARTMENT_OTHER): Payer: Medicare HMO | Attending: Pulmonary Disease | Admitting: Pulmonary Disease

## 2020-10-10 DIAGNOSIS — Z79891 Long term (current) use of opiate analgesic: Secondary | ICD-10-CM | POA: Diagnosis not present

## 2020-10-10 DIAGNOSIS — Z79899 Other long term (current) drug therapy: Secondary | ICD-10-CM | POA: Diagnosis not present

## 2020-10-10 DIAGNOSIS — G4733 Obstructive sleep apnea (adult) (pediatric): Secondary | ICD-10-CM | POA: Insufficient documentation

## 2020-10-10 DIAGNOSIS — E669 Obesity, unspecified: Secondary | ICD-10-CM | POA: Diagnosis not present

## 2020-10-10 DIAGNOSIS — Z7982 Long term (current) use of aspirin: Secondary | ICD-10-CM | POA: Diagnosis not present

## 2020-10-10 DIAGNOSIS — Z791 Long term (current) use of non-steroidal anti-inflammatories (NSAID): Secondary | ICD-10-CM | POA: Diagnosis not present

## 2020-10-15 DIAGNOSIS — G4733 Obstructive sleep apnea (adult) (pediatric): Secondary | ICD-10-CM | POA: Diagnosis not present

## 2020-10-16 ENCOUNTER — Other Ambulatory Visit: Payer: Self-pay | Admitting: Family Medicine

## 2020-10-16 NOTE — Telephone Encounter (Signed)
Ok to refill??  Last office visit 09/05/2020.  Last refill 09/20/2020.

## 2020-10-28 ENCOUNTER — Telehealth: Payer: Self-pay | Admitting: Pulmonary Disease

## 2020-10-28 DIAGNOSIS — G4733 Obstructive sleep apnea (adult) (pediatric): Secondary | ICD-10-CM

## 2020-10-28 NOTE — Telephone Encounter (Signed)
Call patient  Sleep study result  Date of study: 10/10/2020  Impression: Severe obstructive sleep apnea, titrated to CPAP effectively  Recommendation: DME referral  Recommend CPAP therapy for severe obstructive sleep apnea  Trial of CPAP therapy on 14 cm H2O with a Medium size Resmed Full Face Mask AirFit F20 mask and heated humidification.  Encourage weight loss measures  Follow-up in the office 4 to 6 weeks following initiation of treatment

## 2020-10-28 NOTE — Procedures (Signed)
POLYSOMNOGRAPHY Last, [facility], [name] MRN: [id number] Gender: Male Age (years): 64 Weight (lbs): 216 DOB: 01/05/1956 BMI: 33 Primary Care: No PCP Epworth Score: <br> Referring: Laurin Coder MD Technician: Carolin Coy Interpreting: Laurin Coder MD Study Type: Split Night CPAP Ordered Study Type: Split Night CPAP Study date: 10/10/2020 Location: [facility]      CLINICAL INFORMATION      Hunter Pratt is a 64 year old Male and was referred to the sleep center for evaluation of G47.33 OSA: Adult and Pediatric (327.23). Indications include Fatigue, Obesity, OSA.      Most recent polysomnogram dated 07/29/2019 revealed an AHI of 81.8/h. Most recent titration study dated 07/29/2019 was optimal at 19.15cm H2O with an AHI of 0.0/h.     MEDICATIONS      Patient self administered medications include: TRAZODONE, TRAMADOL, ADVIL. Medications administered during study include Sleep medicine administered - Trazodone     SLEEP STUDY TECHNIQUE      The patient underwent an attended overnight level one polysomnography titration to assess the effects of CPAP therapy. The following variables were monitored: EEG (C4-A1, C3-A2, O1-A2, O2-A1), EOG, submental and leg EMG, ECG, oxyhemoglobin saturation by pulse oximetry, thoracic and abdominal respiratory effort belts, nasal/oral airflow by pressure sensor, body position sensor and snoring sensor. CPAP pressure was titrated to eliminate apneas, hypopneas and oxygen desaturation. Hypopneas were scored per AASM definition IB (4% desaturation)      The NPSG portion of the study ended at 1:28:07 AM . The CPAP titration was initiated at 1:33:32 AM AM with the CPAP portion of the study ending at 5:01:05 AM.     TECHNICIAN COMMENTS     Comments added by Technician: PATIENT WAS ORDERED AS A SPLIT NIGHT STUDY.     Comments added by Scorer: N/A     SLEEP ARCHITECTURE     The recording time for the entire night was 397.7 minutes.     The diagnostic  portion was initiated at 10:23:21 PM and terminated at 1:28:07 AM. The time in bed was 184.8 minutes. EEG confirmed total sleep time was 131.5 minutes yielding a sleep efficiency of 71.2%%. Sleep onset after lights out was 18.2 minutes with a REM latency of N/A minutes. The patient spent 20.2%% of the night in stage N1 sleep, 79.8%% in stage N2 sleep, 0.0%% in stage N3 and 0% in REM. The Arousal Index was 47.0/hour.      The titration portion was initiated at 1:33:32 AM and terminated at 5:01:05 AM. The time in bed was 207.5 minutes. EEG confirmed total sleep time was 136.0 minutes yielding a sleep efficiency of 65.5%%. Sleep onset after CPAP initiation was 9.5 minutes with a REM latency of 119.5 minutes. The patient spent 23.5%% of the night in stage N1 sleep, 59.2%% in stage N2 sleep, 0.0%% in stage N3 and 17.3% in REM. The Arousal Index was 29.1/hour.     RESPIRATORY PARAMETERS     During the diagnostic portion, there were a total of 82 respiratory disturbances recorded; 0 apneas ( 0 obstructive, 0 mixed, 0 central), 70 hypopneas and 12 RERAs. The apnea/hypopnea index 31.9 was events/hour and the RDI was 37.4 events/hour. The central sleep apnea index was 0.0 events/hour. The REM AHI was N/A /h and NREM AHI was 31.9/h. The REM RDI was 0.0 /h and NREM RDI was 37.4 /h. The supine AHI was N/A/h, and the non supine AHI was 31.9/h; supine during 0.0%% of sleep. The supine RDI was 0.0/h, and the  non supine RDI was 38.33/h. Respiratory disturbances were associated with oxygen desaturation down to a nadir of 86.0 % during sleep. The mean oxygen saturation during the study was 91.1%. The cumulative time under 88% oxygen saturation was 5 minutes.      During the titration portion, the apnea/hypopnea index (AHI) was 7.9 events/hour and the RDI was 18.1 events/hour. The central sleep apnea index was events/hour. The most appropriate setting of CPAP was IPAP/EPAP 16/16 cm H2O. At this setting, the sleep efficiency was  31% and the patient was supine for 0%. The AHI was 0.0 events per hour(with 0 central events). Oxygen nadir was 94.0.     LEG MOVEMENT DATA     The periodic limb movement index was 0.0/hour with an associated arousal index of /hour.     CARDIAC DATA     The underlying cardiac rhythm was most consistent with sinus rhythm. Mean heart rate was 64.4 during diagnostic portion and 62.7 during titration portion of study. Additional rhythm abnormalities include None.       IMPRESSIONS         - Severe Obstructive Sleep apnea(OSA) Optimal pressure attained.         - EKG showed no cardiac abnormalities.         - No Significant Central Sleep Apnea (CSA)         - Moderate Oxygen Desaturation         - The patient snored with loud snoring volume.         - EEG did not show alpha intrusion.         - No significant periodic leg movements(PLMs) during sleep. However, no significant associated arousals.         - Reduced sleep efficiency, normal primary sleep latency, no REM sleep latency and no slow wave latency.       DIAGNOSIS         - Obstructive Sleep Apnea (G47.33)       RECOMMENDATIONS         - Trial of CPAP therapy on 14 cm H2O with a Medium size Resmed Full Face Mask AirFit F20 mask and heated humidification.         - Avoid alcohol, sedatives and other CNS depressants that may worsen sleep apnea and disrupt normal sleep architecture.         - Sleep hygiene should be reviewed to assess factors that may improve sleep quality.         - Weight management and regular exercise should be initiated or continued.         - Return to Sleep Center for re-evaluation after 4 weeks of therapy   [Electronically signed] 10/28/2020 09:59 AM  Sherrilyn Rist MD NPI: [id number]

## 2020-10-29 NOTE — Telephone Encounter (Signed)
Called spoke with patient. Placed order for CPAP Scheduled patient for follow up in January,  Explained to patient to call us back if he does not received his CPAP within 2 weeks so we can reschedule his appointment.   Nothing further needed at this time.

## 2020-11-01 ENCOUNTER — Other Ambulatory Visit: Payer: Self-pay

## 2020-11-01 ENCOUNTER — Ambulatory Visit (INDEPENDENT_AMBULATORY_CARE_PROVIDER_SITE_OTHER): Payer: Medicare HMO | Admitting: Orthopaedic Surgery

## 2020-11-01 ENCOUNTER — Encounter: Payer: Self-pay | Admitting: Family Medicine

## 2020-11-01 ENCOUNTER — Encounter: Payer: Self-pay | Admitting: Orthopaedic Surgery

## 2020-11-01 VITALS — Ht 68.0 in | Wt 216.0 lb

## 2020-11-01 DIAGNOSIS — M4722 Other spondylosis with radiculopathy, cervical region: Secondary | ICD-10-CM | POA: Diagnosis not present

## 2020-11-01 DIAGNOSIS — M542 Cervicalgia: Secondary | ICD-10-CM

## 2020-11-01 DIAGNOSIS — S129XXS Fracture of neck, unspecified, sequela: Secondary | ICD-10-CM | POA: Diagnosis not present

## 2020-11-01 DIAGNOSIS — S129XXA Fracture of neck, unspecified, initial encounter: Secondary | ICD-10-CM | POA: Insufficient documentation

## 2020-11-01 MED ORDER — TRAMADOL HCL 50 MG PO TABS: ORAL_TABLET | ORAL | 0 refills | Status: DC

## 2020-11-01 NOTE — Progress Notes (Signed)
Office Visit Note   Patient: Hunter Pratt           Date of Birth: 06/25/1956           MRN: [id number] Visit Date: 11/01/2020              Requested by: Alycia Rossetti, MD [address] [place],  [place] [address] PCP: Alycia Rossetti, MD   Assessment & Plan: Visit Diagnoses:  1. Neck pain   2. Cervical spondylosis with radiculopathy   3. Pseudoarthrosis of cervical spine, sequela     Plan: Patient needs cervical CT scan to evaluate him for pseudoarthrosis at C4-5.  His lower level at C5-6 is completely solid but he does have some spurring at C6-7 below the solid C5-6 fusion.  He is having persistent neck symptoms since I last saw him.  We will proceed with CT scan to evaluate him for cervical spondylosis at C6-7 and pseudoarthrosis at the C4-5 level.  Follow-Up Instructions: Return after cervical CT scan.  Orders:  Orders Placed This Encounter  Procedures  . CT CERVICAL SPINE WO CONTRAST   No orders of the defined types were placed in this encounter.     Procedures: No procedures performed   Clinical Data: No additional findings.   Subjective: Chief Complaint  Patient presents with  . Neck - Follow-up, Pain  . Right Knee - Follow-up, Pain  . Left Knee - Follow-up, Pain    HPI six 64-year-old male post cervical fusion C4-5 1990 C5-6 1991 done by Dr. Joya Salm with persistent problems with neck pain gradually getting worse in the last few years.  He is also had some knee problems had knee injection left knee with some improvement.  He walks daily around a 1 acre property for exercise and for weight loss.  Other problems include GERD, hypertension, HIV on medication.  Review of Systems all other systems noncontributory.   Objective: Vital Signs: Ht 5\' 8"  (1.727 m)   Wt 216 lb (98 kg)   BMI 32.84 kg/m   Physical Exam Constitutional:      Appearance: He is well-developed.  HENT:     Head: Normocephalic.     Right Ear: External ear normal.     Left  Ear: External ear normal.  Eyes:     Pupils: Pupils are equal, round, and reactive to light.  Neck:     Thyroid: No thyromegaly.     Trachea: No tracheal deviation.  Cardiovascular:     Rate and Rhythm: Normal rate.  Pulmonary:     Effort: Pulmonary effort is normal.  Abdominal:     Palpations: Abdomen is soft.  Skin:    General: Skin is warm and dry.  Neurological:     Mental Status: He is alert and oriented to person, place, and time.  Psychiatric:        Behavior: Behavior normal.     Ortho Exam patient has pain with forward flexion and extension of cervical spine some brachial plexus tenderness right and left well-healed anterior cervical incision.  Upper extremity reflexes are 2+ and symmetrical.  Specialty Comments:  No specialty comments available.  Imaging: AP lateral cervical spine images are obtained and reviewed. This shows  solid interbody fusion at C5-6. Burring and suggested no pseudoarthrosis  at C4-5 with uncovertebral spurring hypertrophy both right and left.   There is endplate spurring anterior posteriorly at C6-7 and mild at C3-4  above the previous fusion levels.  Impression: Previous cervical fusion C4-C6 with autograft without plate.   Question pseudoarthrosis C4-5 adjacent degenerative changes as described  above.  These are from cervical spine x-rays obtained on 09/20/2020.  PMFS History: Patient Active Problem List   Diagnosis Date Noted  . Cervical pseudoarthrosis ([place]) 11/01/2020  . Bilateral primary osteoarthritis of knee 09/20/2020  . Creatinine elevation 07/09/2020  . Obesity (BMI 30.0-34.9) 05/22/2019  . Mastalgia 06/29/2018  . Tenosynovitis of left wrist 03/18/2017  . Constipation 01/28/2017  . Insomnia 03/03/2014  . Cervical spondylosis with radiculopathy 09/14/2013  . DDD (degenerative disc disease), cervical 08/11/2013  . HTN (hypertension) 07/27/2013  . Lymphadenopathy 03/04/2013  . Eczematous dermatitis 11/14/2012  .  Systolic dysfunction 38/93/7342  . Diabetes type 2, controlled ([facility]) 07/30/2012  . Lower extremity edema 06/09/2012  . OSA (obstructive sleep apnea) 06/09/2012  . DEGENERATIVE JOINT DISEASE, KNEES, BILATERAL 01/30/2009  . Secondary polycythemia 05/22/2008  . Hyperlipidemia 04/21/2008  . GERD 04/21/2008  . MOOD DISORDER IN CONDITIONS CLASSIFIED ELSEWHERE 02/28/2008  . Hereditary and idiopathic peripheral neuropathy 02/28/2008  . PUD 01/03/2008  . Human immunodeficiency virus (HIV) disease ([place] [facility]) 12/28/2006   Past Medical History:  Diagnosis Date  . Abnormal white blood cell 01/27/2018  . Anemia    past hx   . Arthritis   . Constipation 01/28/2017  . Creatinine elevation 07/09/2020  . DDD (degenerative disc disease), cervical   . Diffuse abdominal pain 12/30/2018  . Ganglion cyst 01/28/2017  . GERD (gastroesophageal reflux disease)   . Hearing loss    Bilateral  . HIV infection ([facility])   . Hyperlipidemia   . Kidney stone   . LLQ pain 12/28/2019  . OA (osteoarthritis) of knee   . Obesity 06/30/2019  . Osteoporosis   . PUD (peptic ulcer disease)   . Sleep apnea    dx'd no current cpap use  . Systolic dysfunction    EF 40-45%    Family History  Problem Relation Age of Onset  . Hypertension Mother   . Osteoporosis Mother   . Osteoporosis Father   . Depression Father   . Hearing loss Father   . Colon polyps Father   . Osteoporosis Sister   . Arthritis Sister   . Hypertension Brother   . Hyperlipidemia Brother   . Depression Brother   . Colon cancer Neg Hx   . Rectal cancer Neg Hx   . Stomach cancer Neg Hx   . Esophageal cancer Neg Hx     Past Surgical History:  Procedure Laterality Date  . CERVICAL DISCECTOMY  1990 and 1991  . COLONOSCOPY    . GANGLION CYST EXCISION Left 03/18/2017   Procedure: REMOVAL GANGLION OF LEFT WRIST, TENOLYSIS;  Surgeon: Leandrew Koyanagi, MD;  Location: [facility];  Service: Orthopedics;  Laterality: Left;  . POLYPECTOMY     Social  History   Occupational History  . Not on file  Tobacco Use  . Smoking status: Former Smoker    Packs/day: 1.00    Years: 20.00    Pack years: 20.00    Types: Cigarettes    Quit date: 2008    Years since quitting: 13.9  . Smokeless tobacco: Never Used  Vaping Use  . Vaping Use: Never used  Substance and Sexual Activity  . Alcohol use: Not Currently  . Drug use: No  . Sexual activity: Not Currently    Comment: refused condoms

## 2020-11-03 ENCOUNTER — Other Ambulatory Visit: Payer: Self-pay | Admitting: Family Medicine

## 2020-11-06 ENCOUNTER — Encounter: Payer: Self-pay | Admitting: Family Medicine

## 2020-11-06 DIAGNOSIS — H524 Presbyopia: Secondary | ICD-10-CM | POA: Diagnosis not present

## 2020-11-06 DIAGNOSIS — Z0100 Encounter for examination of eyes and vision without abnormal findings: Secondary | ICD-10-CM | POA: Diagnosis not present

## 2020-11-06 DIAGNOSIS — H5203 Hypermetropia, bilateral: Secondary | ICD-10-CM | POA: Diagnosis not present

## 2020-11-06 DIAGNOSIS — H2513 Age-related nuclear cataract, bilateral: Secondary | ICD-10-CM | POA: Diagnosis not present

## 2020-11-06 DIAGNOSIS — E119 Type 2 diabetes mellitus without complications: Secondary | ICD-10-CM | POA: Diagnosis not present

## 2020-11-06 LAB — HM DIABETES EYE EXAM

## 2020-11-08 ENCOUNTER — Encounter: Payer: Self-pay | Admitting: Family Medicine

## 2020-11-25 ENCOUNTER — Other Ambulatory Visit: Payer: Self-pay | Admitting: Family Medicine

## 2020-11-26 DIAGNOSIS — H01005 Unspecified blepharitis left lower eyelid: Secondary | ICD-10-CM | POA: Diagnosis not present

## 2020-11-26 DIAGNOSIS — H01001 Unspecified blepharitis right upper eyelid: Secondary | ICD-10-CM | POA: Diagnosis not present

## 2020-11-26 DIAGNOSIS — E119 Type 2 diabetes mellitus without complications: Secondary | ICD-10-CM | POA: Diagnosis not present

## 2020-11-26 DIAGNOSIS — H01004 Unspecified blepharitis left upper eyelid: Secondary | ICD-10-CM | POA: Diagnosis not present

## 2020-11-26 DIAGNOSIS — H01002 Unspecified blepharitis right lower eyelid: Secondary | ICD-10-CM | POA: Diagnosis not present

## 2020-11-26 DIAGNOSIS — H2513 Age-related nuclear cataract, bilateral: Secondary | ICD-10-CM | POA: Diagnosis not present

## 2020-11-26 MED ORDER — TRAMADOL HCL 50 MG PO TABS
ORAL_TABLET | ORAL | 1 refills | Status: DC
Start: 2020-11-26 — End: 2021-01-07

## 2020-11-27 ENCOUNTER — Other Ambulatory Visit: Payer: Medicare HMO

## 2020-11-27 ENCOUNTER — Other Ambulatory Visit: Payer: Self-pay | Admitting: Family Medicine

## 2020-11-27 ENCOUNTER — Other Ambulatory Visit: Payer: Self-pay

## 2020-11-27 DIAGNOSIS — R69 Illness, unspecified: Secondary | ICD-10-CM | POA: Diagnosis not present

## 2020-11-27 DIAGNOSIS — B20 Human immunodeficiency virus [HIV] disease: Secondary | ICD-10-CM | POA: Diagnosis not present

## 2020-11-27 DIAGNOSIS — E119 Type 2 diabetes mellitus without complications: Secondary | ICD-10-CM | POA: Diagnosis not present

## 2020-11-27 DIAGNOSIS — D751 Secondary polycythemia: Secondary | ICD-10-CM

## 2020-11-27 DIAGNOSIS — R7989 Other specified abnormal findings of blood chemistry: Secondary | ICD-10-CM | POA: Diagnosis not present

## 2020-11-28 LAB — T-HELPER CELL (CD4) - (RCID CLINIC ONLY)
CD4 % Helper T Cell: 29 % — ABNORMAL LOW (ref 33–65)
CD4 T Cell Abs: 615 /uL (ref 400–1790)

## 2020-12-06 ENCOUNTER — Other Ambulatory Visit: Payer: Self-pay | Admitting: Infectious Disease

## 2020-12-06 DIAGNOSIS — B20 Human immunodeficiency virus [HIV] disease: Secondary | ICD-10-CM

## 2020-12-09 LAB — CBC WITH DIFFERENTIAL/PLATELET
Absolute Monocytes: 854 cells/uL (ref 200–950)
Basophils Absolute: 134 cells/uL (ref 0–200)
Basophils Relative: 1.5 %
Eosinophils Absolute: 694 cells/uL — ABNORMAL HIGH (ref 15–500)
Eosinophils Relative: 7.8 %
HCT: 46.5 % (ref 38.5–50.0)
Hemoglobin: 16.5 g/dL (ref 13.2–17.1)
Lymphs Abs: 2136 cells/uL (ref 850–3900)
MCH: 34.1 pg — ABNORMAL HIGH (ref 27.0–33.0)
MCHC: 35.5 g/dL (ref 32.0–36.0)
MCV: 96.1 fL (ref 80.0–100.0)
MPV: 9.9 fL (ref 7.5–12.5)
Monocytes Relative: 9.6 %
Neutro Abs: 5082 cells/uL (ref 1500–7800)
Neutrophils Relative %: 57.1 %
Platelets: 321 10*3/uL (ref 140–400)
RBC: 4.84 10*6/uL (ref 4.20–5.80)
RDW: 12.8 % (ref 11.0–15.0)
Total Lymphocyte: 24 %
WBC: 8.9 10*3/uL (ref 3.8–10.8)

## 2020-12-09 LAB — COMPLETE METABOLIC PANEL WITH GFR
AG Ratio: 1.3 (calc) (ref 1.0–2.5)
ALT: 16 U/L (ref 9–46)
AST: 17 U/L (ref 10–35)
Albumin: 4.1 g/dL (ref 3.6–5.1)
Alkaline phosphatase (APISO): 95 U/L (ref 35–144)
BUN: 15 mg/dL (ref 7–25)
CO2: 29 mmol/L (ref 20–32)
Calcium: 9.8 mg/dL (ref 8.6–10.3)
Chloride: 103 mmol/L (ref 98–110)
Creat: 1.12 mg/dL (ref 0.70–1.25)
GFR, Est African American: 81 mL/min/{1.73_m2} (ref >=60)
GFR, Est Non African American: 70 mL/min/{1.73_m2} (ref >=60)
Globulin: 3.2 g/dL (calc) (ref 1.9–3.7)
Glucose, Bld: 113 mg/dL — ABNORMAL HIGH (ref 65–99)
Potassium: 4.6 mmol/L (ref 3.5–5.3)
Sodium: 138 mmol/L (ref 135–146)
Total Bilirubin: 0.3 mg/dL (ref 0.2–1.2)
Total Protein: 7.3 g/dL (ref 6.1–8.1)

## 2020-12-09 LAB — LIPID PANEL
Cholesterol: 139 mg/dL (ref <200)
HDL: 42 mg/dL (ref >=40)
LDL Cholesterol (Calc): 73 mg/dL (calc)
Non-HDL Cholesterol (Calc): 97 mg/dL (calc) (ref <130)
Total CHOL/HDL Ratio: 3.3 (calc) (ref <5.0)
Triglycerides: 162 mg/dL — ABNORMAL HIGH (ref <150)

## 2020-12-09 LAB — RPR: RPR Ser Ql: NONREACTIVE

## 2020-12-09 LAB — HIV-1 RNA QUANT-NO REFLEX-BLD
HIV 1 RNA Quant: <20 Copies/mL
HIV-1 RNA Quant, Log: <1.3 Log cps/mL

## 2020-12-11 ENCOUNTER — Ambulatory Visit: Payer: Medicare HMO | Admitting: Pulmonary Disease

## 2020-12-11 ENCOUNTER — Ambulatory Visit: Payer: Medicare HMO

## 2020-12-11 ENCOUNTER — Ambulatory Visit: Payer: Medicare HMO | Admitting: Infectious Disease

## 2020-12-11 ENCOUNTER — Encounter: Payer: Self-pay | Admitting: Orthopaedic Surgery

## 2020-12-11 ENCOUNTER — Other Ambulatory Visit: Payer: Self-pay

## 2020-12-11 VITALS — BP 136/87 | HR 85 | Temp 98.3°F | Wt 227.4 lb

## 2020-12-11 DIAGNOSIS — E782 Mixed hyperlipidemia: Secondary | ICD-10-CM

## 2020-12-11 DIAGNOSIS — I10 Essential (primary) hypertension: Secondary | ICD-10-CM

## 2020-12-11 DIAGNOSIS — G4733 Obstructive sleep apnea (adult) (pediatric): Secondary | ICD-10-CM | POA: Diagnosis not present

## 2020-12-11 DIAGNOSIS — B20 Human immunodeficiency virus [HIV] disease: Secondary | ICD-10-CM | POA: Diagnosis not present

## 2020-12-11 DIAGNOSIS — M4722 Other spondylosis with radiculopathy, cervical region: Secondary | ICD-10-CM

## 2020-12-11 DIAGNOSIS — R69 Illness, unspecified: Secondary | ICD-10-CM | POA: Diagnosis not present

## 2020-12-11 NOTE — Progress Notes (Signed)
Chief complaint: Follow-up for HIV disease planing of what seems to be an umbilical hernia.  Subjective:    Patient ID: Hunter Pratt, adult    DOB: Sep 30, 1956, 65 y.o.   MRN: [id number]  HPI  65  Year old male who is doing superbly well on his antiviral regimen, of intelence, Isentress both QDaily and Descovy --> BIKTARVY  He has had his sleep study but still it has not been delivered proper machine to treat his sleep apnea due to it being on backorder.  He is being evaluated for cervical spine pathology by Dr. Lorin Mercy and is supposed to have a CT scan of the cervical spine.  He also developed an area of what appears to be an umbilical hernia.  Not especially tender but he just had noticed that and was concerned about it  He had lost weight but has regained much of it over the holidays.   Past Medical History:  Diagnosis Date  . Abnormal white blood cell 01/27/2018  . Anemia    past hx   . Arthritis   . Constipation 01/28/2017  . Creatinine elevation 07/09/2020  . DDD (degenerative disc disease), cervical   . Diffuse abdominal pain 12/30/2018  . Ganglion cyst 01/28/2017  . GERD (gastroesophageal reflux disease)   . Hearing loss    Bilateral  . HIV infection ([place])   . Hyperlipidemia   . Kidney stone   . LLQ pain 12/28/2019  . OA (osteoarthritis) of knee   . Obesity 06/30/2019  . Osteoporosis   . PUD (peptic ulcer disease)   . Sleep apnea    dx'd no current cpap use  . Systolic dysfunction    EF 40-45%    Past Surgical History:  Procedure Laterality Date  . CERVICAL DISCECTOMY  1990 and 1991  . COLONOSCOPY    . GANGLION CYST EXCISION Left 03/18/2017   Procedure: REMOVAL GANGLION OF LEFT WRIST, TENOLYSIS;  Surgeon: Leandrew Koyanagi, MD;  Location: [facility];  Service: Orthopedics;  Laterality: Left;  . POLYPECTOMY      Family History  Problem Relation Age of Onset  . Hypertension Mother   . Osteoporosis Mother   . Osteoporosis Father   . Depression  Father   . Hearing loss Father   . Colon polyps Father   . Osteoporosis Sister   . Arthritis Sister   . Hypertension Brother   . Hyperlipidemia Brother   . Depression Brother   . Colon cancer Neg Hx   . Rectal cancer Neg Hx   . Stomach cancer Neg Hx   . Esophageal cancer Neg Hx       Social History   Socioeconomic History  . Marital status: Single    Spouse name: Not on file  . Number of children: Not on file  . Years of education: Not on file  . Highest education level: Not on file  Occupational History  . Not on file  Tobacco Use  . Smoking status: Former Smoker    Packs/day: 1.00    Years: 20.00    Pack years: 20.00    Types: Cigarettes    Quit date: 2008    Years since quitting: 14.0  . Smokeless tobacco: Never Used  Vaping Use  . Vaping Use: Never used  Substance and Sexual Activity  . Alcohol use: Not Currently  . Drug use: No  . Sexual activity: Not Currently    Comment: refused condoms  Other Topics Concern  .  Not on file  Social History Narrative  . Not on file   Social Determinants of Health   Financial Resource Strain: Not on file  Food Insecurity: Not on file  Transportation Needs: Not on file  Physical Activity: Not on file  Stress: Not on file  Social Connections: Not on file    Allergies  Allergen Reactions  . Naproxen Sodium     REACTION: kidney failure  . Nsaids     REACTION: GI upset  . Other Rash    Black dye     Current Outpatient Medications:  .  aspirin 325 MG tablet, Take 325 mg by mouth daily. , Disp: , Rfl:  .  atorvastatin (LIPITOR) 20 MG tablet, Take 1 tablet (20 mg total) by mouth daily., Disp: 90 tablet, Rfl: 3 .  BIKTARVY 50-200-25 MG TABS tablet, TAKE 1 TABLET BY MOUTH DAILY, Disp: 30 tablet, Rfl: 0 .  Blood Glucose Monitoring Suppl (BLOOD GLUCOSE SYSTEM PAK) KIT, Please dispense based on patient and insurance preference. Use as directed to monitor FSBS 1x daily. Dx: E11.9., Disp: 1 kit, Rfl: 1 .  famotidine  (PEPCID) 20 MG tablet, Take 1 tablet (20 mg total) by mouth at bedtime., Disp: , Rfl:  .  fluocinonide ointment (LIDEX) 0.05 %, APPLY EXTERNALLY TO THE AFFECTED AREA TWICE DAILY, Disp: 30 g, Rfl: 1 .  Glucose Blood (BLOOD GLUCOSE TEST STRIPS) STRP, Please dispense based on patient and insurance preference. Use as directed to monitor FSBS 1x daily. Dx: E11.9., Disp: 100 strip, Rfl: 1 .  Lancets MISC, Please dispense based on patient and insurance preference. Use as directed to monitor FSBS 1x daily. Dx: E11.9., Disp: 100 each, Rfl: 1 .  omeprazole (PRILOSEC) 40 MG capsule, Take 1 capsule 20-30 minutes before breakfast each day., Disp: 90 capsule, Rfl: 3 .  traMADol (ULTRAM) 50 MG tablet, TAKE 1 TABLET(50 MG) BY MOUTH EVERY 8 HOURS for chronic pain, Disp: 30 tablet, Rfl: 1 .  traZODone (DESYREL) 50 MG tablet, TAKE 1 TABLET(50 MG) BY MOUTH AT BEDTIME AS NEEDED FOR SLEEP, Disp: 90 tablet, Rfl: 3  Review of Systems  Constitutional: Negative for chills and fever.  HENT: Negative for congestion and sore throat.   Eyes: Negative for photophobia.  Respiratory: Negative for cough, shortness of breath and wheezing.   Cardiovascular: Negative for chest pain, palpitations and leg swelling.  Gastrointestinal: Negative for blood in stool, nausea, rectal pain and vomiting.  Genitourinary: Negative for dysuria, flank pain and hematuria.  Musculoskeletal: Negative for back pain and myalgias.  Skin: Negative for color change and rash.  Neurological: Negative for dizziness, weakness and headaches.  Hematological: Does not bruise/bleed easily.  Psychiatric/Behavioral: Negative for agitation, confusion, decreased concentration, dysphoric mood, hallucinations and suicidal ideas.       Objective:   Physical Exam Vitals and nursing note reviewed.  Constitutional:      General: He is not in acute distress.    Appearance: He is well-developed. He is not diaphoretic.  HENT:     Head: Normocephalic and atraumatic.      Mouth/Throat:     Pharynx: No oropharyngeal exudate.  Eyes:     General: No scleral icterus.    Conjunctiva/sclera: Conjunctivae normal.  Cardiovascular:     Rate and Rhythm: Normal rate and regular rhythm.  Pulmonary:     Effort: Pulmonary effort is normal.     Breath sounds: No wheezing or rales.  Abdominal:     General: Bowel sounds are normal.  Palpations: Abdomen is soft. Abdomen is not rigid.     Hernia: A hernia is present.  Musculoskeletal:        General: No tenderness.     Cervical back: Normal range of motion and neck supple.  Skin:    General: Skin is warm and dry.     Coloration: Skin is not pale.     Findings: No erythema or rash.  Neurological:     Mental Status: He is alert and oriented to person, place, and time.     Motor: No abnormal muscle tone.     Coordination: Coordination normal.  Psychiatric:        Attention and Perception: Attention and perception normal.        Mood and Affect: Mood normal.        Behavior: Behavior normal.        Thought Content: Thought content normal.        Cognition and Memory: Cognition and memory normal.        Judgment: Judgment normal.     Assessment & Plan:    HIV: continue  BIKTARVY, return to clinic in 6 months t in July  for renewal of his SPAP then annually  HTN well-controlled though higher in clinic Vitals:   12/11/20 0858  BP: 136/87  Pulse: 85  Temp: 98.3 F (36.8 C)     Hyperlipidemia: followed by PCP,   Secondary polycythemia: This is undoubtedly due to obstructive sleep apnea improved recently.  Obstructive sleep apnea : Hopefully he will eventually get his machine so he can treat this. Obesity weight loss would be very helpful for his obstructive sleep apnea his diabetes and also reducing his risk for novel coronavirus 2019.  He will continue to work on this.   Elevated serum creatinine: We will check repeat metabolic panel as well as a FENa

## 2020-12-13 NOTE — Telephone Encounter (Signed)
Faxed order to [facility]

## 2020-12-24 ENCOUNTER — Ambulatory Visit (HOSPITAL_COMMUNITY)
Admission: RE | Admit: 2020-12-24 | Discharge: 2020-12-24 | Disposition: A | Discharge status: Home / Self Care | Payer: Medicare HMO | Source: Ambulatory Visit | Attending: Orthopaedic Surgery | Admitting: Orthopaedic Surgery

## 2020-12-24 ENCOUNTER — Other Ambulatory Visit: Payer: Self-pay

## 2020-12-24 DIAGNOSIS — E669 Obesity, unspecified: Secondary | ICD-10-CM | POA: Diagnosis not present

## 2020-12-24 DIAGNOSIS — R69 Illness, unspecified: Secondary | ICD-10-CM | POA: Diagnosis not present

## 2020-12-24 DIAGNOSIS — D84821 Immunodeficiency due to drugs: Secondary | ICD-10-CM | POA: Diagnosis not present

## 2020-12-24 DIAGNOSIS — M542 Cervicalgia: Secondary | ICD-10-CM | POA: Diagnosis not present

## 2020-12-24 DIAGNOSIS — G8929 Other chronic pain: Secondary | ICD-10-CM | POA: Diagnosis not present

## 2020-12-24 DIAGNOSIS — E1140 Type 2 diabetes mellitus with diabetic neuropathy, unspecified: Secondary | ICD-10-CM | POA: Diagnosis not present

## 2020-12-24 DIAGNOSIS — Z6833 Body mass index (BMI) 33.0-33.9, adult: Secondary | ICD-10-CM | POA: Diagnosis not present

## 2020-12-24 DIAGNOSIS — G4700 Insomnia, unspecified: Secondary | ICD-10-CM | POA: Diagnosis not present

## 2020-12-24 DIAGNOSIS — E785 Hyperlipidemia, unspecified: Secondary | ICD-10-CM | POA: Diagnosis not present

## 2020-12-24 DIAGNOSIS — Z79891 Long term (current) use of opiate analgesic: Secondary | ICD-10-CM | POA: Diagnosis not present

## 2020-12-24 DIAGNOSIS — K219 Gastro-esophageal reflux disease without esophagitis: Secondary | ICD-10-CM | POA: Diagnosis not present

## 2020-12-31 ENCOUNTER — Other Ambulatory Visit: Payer: Self-pay | Admitting: Infectious Disease

## 2020-12-31 DIAGNOSIS — B20 Human immunodeficiency virus [HIV] disease: Secondary | ICD-10-CM

## 2021-01-03 ENCOUNTER — Encounter: Payer: Self-pay | Admitting: Orthopaedic Surgery

## 2021-01-03 ENCOUNTER — Ambulatory Visit (INDEPENDENT_AMBULATORY_CARE_PROVIDER_SITE_OTHER): Payer: Medicare HMO | Admitting: Orthopaedic Surgery

## 2021-01-03 ENCOUNTER — Other Ambulatory Visit: Payer: Self-pay

## 2021-01-03 VITALS — Ht 68.0 in | Wt 227.0 lb

## 2021-01-03 DIAGNOSIS — S129XXS Fracture of neck, unspecified, sequela: Secondary | ICD-10-CM | POA: Diagnosis not present

## 2021-01-03 DIAGNOSIS — M4802 Spinal stenosis, cervical region: Secondary | ICD-10-CM | POA: Insufficient documentation

## 2021-01-03 NOTE — Progress Notes (Signed)
Office Visit Note   Patient: Hunter Pratt           Date of Birth: 1956/09/03           MRN: [id number] Visit Date: 01/03/2021              Requested by: Alycia Rossetti, MD [address] [place],  [place] [address] PCP: Alycia Rossetti, MD   Assessment & Plan: Visit Diagnoses:  1. Pseudoarthrosis of cervical spine, sequela   2. Spinal stenosis of cervical region     Plan: Patient has cervical stenosis narrowing down to 6 mm at C4-5 where is not solid and has a few millimeters retrolisthesis.  Patient is tight centrally and would require repeat anterior procedure.  We discussed typically posterior procedure is better for pseudoarthrosis however he has central stenosis and this would need to be approached from the front.  We discussed with him he might have to have a posterior procedure at some point in the future if the repeat anterior procedure does not give him solid fusion.  He is waiting on his CPAP machine to get delivered.  Hemoglobin is greater than 17 he has had problems sleeping and staying on his side when he sleeps to help with the sleep apnea.  I plan to recheck him in 6weeks.  Currently only emergent surgery is being scheduled due to Covid restrictions at the hospital and when he returns in 6 weeks we can discuss surgical scheduling. Follow-Up Instructions: Return in about 6 weeks (around 02/14/2021).   Orders:  No orders of the defined types were placed in this encounter.  No orders of the defined types were placed in this encounter.     Procedures: No procedures performed   Clinical Data: No additional findings.   Subjective: Chief Complaint  Patient presents with  . Neck - Pain, Follow-up    CT review    HPI 65 year old male returns with gradual progressive increasing neck and shoulder pain x1 year.  Previous C5-6 fusion more than 20 years ago followed by C4-5 fusion a few years later in approximately 1991 by Dr. Joya Salm.  He has had persistent pain  and CT scan has been obtained which shows pseudoarthrosis at C4-5.  The C5-6 level was solid as expected.  C6-7 shows by foraminal stenosis but is not having any radicular symptoms in his arms currently mostly neck and axial spine pain particularly with rotation or neck flexion.  Review of Systems possible GERD hypertension obesity, chronic antiviral therapy.  All other systems noncontributory to HPI.   Objective: Vital Signs: Ht 5\' 8"  (1.727 m)   Wt 227 lb (103 kg)   BMI 34.52 kg/m   Physical Exam Constitutional:      Appearance: He is well-developed.  HENT:     Head: Normocephalic.     Right Ear: External ear normal.     Left Ear: External ear normal.  Eyes:     Pupils: Pupils are equal, round, and reactive to light.  Neck:     Thyroid: No thyromegaly.     Trachea: No tracheal deviation.  Cardiovascular:     Rate and Rhythm: Normal rate.  Pulmonary:     Effort: Pulmonary effort is normal.  Abdominal:     Palpations: Abdomen is soft.  Skin:    General: Skin is warm and dry.  Neurological:     Mental Status: He is alert and oriented to person, place, and time.  Psychiatric:  Mood and Affect: Mood and affect normal.        Behavior: Behavior normal.     Ortho Exam patient has brachial plexus tenderness both right and left.  Increased pain forward flexion with positive Lhermitte.  Reflexes are symmetrical upper and lower extremities no lower extremity clonus.  Specialty Comments:  No specialty comments available.  Imaging: CLINICAL DATA:  Neck pain, prior cervical fusion  EXAM: CT CERVICAL SPINE WITHOUT CONTRAST  TECHNIQUE: Multidetector CT imaging of the cervical spine was performed without intravenous contrast. Multiplanar CT image reconstructions were also generated.  COMPARISON:  MRI 08/19/2013  FINDINGS: Alignment: Normal cervical lordosis. Anterior cervical discectomy infusion of C5-6 with solid incorporation of interbody bone graft. 4 mm  retrolisthesis of C4 upon C5 has progressed since prior examination (previously 2 mm).  Skull base and vertebrae: The craniocervical junction is unremarkable. The atlantodental interval is normal. No acute fracture of the cervical spine. No lytic or blastic bone lesion.  Soft tissues and spinal canal: Retrolisthesis of C4 upon C5 in combination with a posterior disc osteophyte complex results in moderate to severe central canal stenosis with an AP diameter of the canal of approximately 6 mm at this level, progressive since prior examination (previously 8 mm). There is resultant flattening of the thecal sac. No canal hematoma. No significant prevertebral soft tissue swelling. No paraspinal fluid collections are identified. No pathologic adenopathy within the cervical soft tissues. Thyroid unremarkable.  Disc levels: Review of the sagittal reformats demonstrates marked intervertebral disc space narrowing and endplate remodeling at H2-0 and C6-7 with associated vertebral sclerosis in keeping with Modic changes compatible with severe degenerative disc disease. Vertebral body height has been preserved. There is ankylosis of the right C5-6 facet. Marked central canal stenosis at C4-5, as described above. The prevertebral soft tissues are not thickened.  Review of the axial images demonstrates:  C2-3: Moderate left facet arthrosis. Mild left neural foraminal narrowing. No significant canal stenosis.  C3-4: Severe left facet arthrosis. Moderate bilateral uncovertebral arthrosis, right greater than left. Resultant mild right and moderate left neural foraminal narrowing. Small posterior disc herniation abuts the thecal sac. No significant canal stenosis.  C4-5: Posterior disc osteophyte complex, as described above, results in moderate to severe central canal stenosis in combination with retrolisthesis of C4 upon C5 with flattening of the thecal sac. Severe bilateral neural  foraminal narrowing.  C5-6: No significant arthropathy. No significant neural foraminal narrowing. No canal stenosis.  C6-7: Severe uncovertebral arthrosis results in moderate to severe bilateral neural foraminal narrowing. No significant canal stenosis.  C7-T1: No significant arthropathy. No significant neural foraminal narrowing. No canal stenosis.  Upper chest: Unremarkable  Other: None significant  IMPRESSION: Solid anterior cervical discectomy and fusion C5-6.  Progressive degenerative disc disease C4-5 and C6-7, now severe, with superimposed progressive retrolisthesis of C4 upon C5 resulting in moderate to severe central canal stenosis with flattening of the thecal sac and severe bilateral neural foraminal narrowing at this level. Moderate to severe bilateral neural foraminal narrowing also noted at C6-7.   Electronically Signed   By: Fidela Salisbury MD   On: 12/24/2020 22:14    PMFS History: Patient Active Problem List   Diagnosis Date Noted  . Spinal stenosis of cervical region 01/03/2021  . Cervical pseudoarthrosis ([place]) 11/01/2020  . Bilateral primary osteoarthritis of knee 09/20/2020  . Creatinine elevation 07/09/2020  . Obesity (BMI 30.0-34.9) 05/22/2019  . Mastalgia 06/29/2018  . Tenosynovitis of left wrist 03/18/2017  . Constipation 01/28/2017  .  Insomnia 03/03/2014  . Cervical spondylosis with radiculopathy 09/14/2013  . DDD (degenerative disc disease), cervical 08/11/2013  . HTN (hypertension) 07/27/2013  . Lymphadenopathy 03/04/2013  . Eczematous dermatitis 11/14/2012  . Systolic dysfunction [id number]  . Diabetes type 2, controlled ([place]) 07/30/2012  . Lower extremity edema 06/09/2012  . OSA (obstructive sleep apnea) 06/09/2012  . DEGENERATIVE JOINT DISEASE, KNEES, BILATERAL 01/30/2009  . Secondary polycythemia 05/22/2008  . Hyperlipidemia 04/21/2008  . GERD 04/21/2008  . MOOD DISORDER IN CONDITIONS CLASSIFIED ELSEWHERE 02/28/2008  .  Hereditary and idiopathic peripheral neuropathy 02/28/2008  . PUD 01/03/2008  . Human immunodeficiency virus (HIV) disease ([place]) 12/28/2006   Past Medical History:  Diagnosis Date  . Abnormal white blood cell 01/27/2018  . Anemia    past hx   . Arthritis   . Constipation 01/28/2017  . Creatinine elevation 07/09/2020  . DDD (degenerative disc disease), cervical   . Diffuse abdominal pain 12/30/2018  . Ganglion cyst 01/28/2017  . GERD (gastroesophageal reflux disease)   . Hearing loss    Bilateral  . HIV infection ([place])   . Hyperlipidemia   . Kidney stone   . LLQ pain 12/28/2019  . OA (osteoarthritis) of knee   . Obesity 06/30/2019  . Osteoporosis   . PUD (peptic ulcer disease)   . Sleep apnea    dx'd no current cpap use  . Systolic dysfunction    EF 40-45%    Family History  Problem Relation Age of Onset  . Hypertension Mother   . Osteoporosis Mother   . Osteoporosis Father   . Depression Father   . Hearing loss Father   . Colon polyps Father   . Osteoporosis Sister   . Arthritis Sister   . Hypertension Brother   . Hyperlipidemia Brother   . Depression Brother   . Colon cancer Neg Hx   . Rectal cancer Neg Hx   . Stomach cancer Neg Hx   . Esophageal cancer Neg Hx     Past Surgical History:  Procedure Laterality Date  . CERVICAL DISCECTOMY  1990 and 1991  . COLONOSCOPY    . GANGLION CYST EXCISION Left 03/18/2017   Procedure: REMOVAL GANGLION OF LEFT WRIST, TENOLYSIS;  Surgeon: Leandrew Koyanagi, MD;  Location: [facility];  Service: Orthopedics;  Laterality: Left;  . POLYPECTOMY     Social History   Occupational History  . Not on file  Tobacco Use  . Smoking status: Former Smoker    Packs/day: 1.00    Years: 20.00    Pack years: 20.00    Types: Cigarettes    Quit date: 2008    Years since quitting: 14.1  . Smokeless tobacco: Never Used  Vaping Use  . Vaping Use: Never used  Substance and Sexual Activity  . Alcohol use: Not Currently  . Drug use:  No  . Sexual activity: Not Currently    Comment: refused condoms

## 2021-01-07 ENCOUNTER — Ambulatory Visit (INDEPENDENT_AMBULATORY_CARE_PROVIDER_SITE_OTHER): Payer: Medicare HMO | Admitting: Family Medicine

## 2021-01-07 ENCOUNTER — Encounter: Payer: Self-pay | Admitting: Family Medicine

## 2021-01-07 ENCOUNTER — Other Ambulatory Visit: Payer: Self-pay

## 2021-01-07 VITALS — BP 128/62 | HR 78 | Temp 98.5°F | Resp 14 | Ht 68.0 in | Wt 226.0 lb

## 2021-01-07 DIAGNOSIS — B20 Human immunodeficiency virus [HIV] disease: Secondary | ICD-10-CM

## 2021-01-07 DIAGNOSIS — G4700 Insomnia, unspecified: Secondary | ICD-10-CM | POA: Diagnosis not present

## 2021-01-07 DIAGNOSIS — Z0001 Encounter for general adult medical examination with abnormal findings: Secondary | ICD-10-CM

## 2021-01-07 DIAGNOSIS — E119 Type 2 diabetes mellitus without complications: Secondary | ICD-10-CM

## 2021-01-07 DIAGNOSIS — R69 Illness, unspecified: Secondary | ICD-10-CM | POA: Diagnosis not present

## 2021-01-07 DIAGNOSIS — Z0000 Encounter for general adult medical examination without abnormal findings: Secondary | ICD-10-CM

## 2021-01-07 DIAGNOSIS — H6121 Impacted cerumen, right ear: Secondary | ICD-10-CM

## 2021-01-07 DIAGNOSIS — M5030 Other cervical disc degeneration, unspecified cervical region: Secondary | ICD-10-CM | POA: Diagnosis not present

## 2021-01-07 DIAGNOSIS — I10 Essential (primary) hypertension: Secondary | ICD-10-CM | POA: Diagnosis not present

## 2021-01-07 MED ORDER — TRAZODONE HCL 50 MG PO TABS: ORAL_TABLET | ORAL | 2 refills | Status: DC

## 2021-01-07 MED ORDER — TRAMADOL HCL 50 MG PO TABS: ORAL_TABLET | ORAL | 2 refills | Status: DC

## 2021-01-07 NOTE — Progress Notes (Signed)
Subjective:   Patient presents for Medicare Annual/Subsequent preventive examination.  Patient presents for Medicare Annual/Subsequent preventive examination.    DM- last A1C 5.9% in Oct  , no current meds  reviewed labs from December   renal function at goal Fasting 130 this am 110-140's  HLD- taking lipitor , recent lipid panel reviewed   HIV- followed by infectious disease,doing well on meds  HTn controlled with diet, no current meds  DDD/ Chronic neck pain/OA knees  on Ulram   he is awaiting repeat neck surgery   [facility] [address]   Chronic insomnia maintained on trazodone  GERD on PPI and H2 blocker   Due for cataract surgery   No new concerns   Review Past Medical/Family/Social: Per EMR   Risk Factors  Current exercise habits: walks/outdoor work Dietary issues discussed: yes  Cardiac risk factors: Obesity (BMI >= 30 kg/m2). DM  Depression Screen  (Note: if answer to either of the following is "Yes", a more complete depression screening is indicated)  Over the past two weeks, have you felt down, depressed or hopeless? No Over the past two weeks, have you felt little interest or pleasure in doing things? No Have you lost interest or pleasure in daily life? No Do you often feel hopeless? No Do you cry easily over simple problems? No   Activities of Daily Living  In your present state of health, do you have any difficulty performing the following activities?:  Driving? No  Managing money? No  Feeding yourself? No  Getting from bed to chair? No  Climbing a flight of stairs? No  Preparing food and eating?: No  Bathing or showering? No  Getting dressed: No  Getting to the toilet? No  Using the toilet:No  Moving around from place to place: No  In the past year have you fallen or had a near fall?:No     Hearing Difficulties: Yes hearing aides Do you often ask people to speak up or repeat themselves? yes Do you experience ringing or noises in  your ears? No Do you have difficulty understanding soft or whispered voices? Yes  Do you feel that you have a problem with memory? No Do you often misplace items? No  Do you feel safe at home? Yes  Cognitive Testing  Alert? Yes Normal Appearance?Yes  Oriented to person? Yes Place? Yes  Time? Yes  Recall of three objects? Yes  Can perform simple calculations? Yes  Displays appropriate judgment?Yes  Can read the correct time from a watch face?Yes    List the Names of Other Physician/Practitioners you currently use: ID- Dr. Tommy Medal , orthopedics - Dr. [facility]  Pulmonary   Screening Tests / Date ColonoscopyUTD Shingrix -Declines Pneumonia- UTD Influenza VaccineUTD Tetanus/tdaputd COVID- UTD PNA- UTD  ROS: GEN- denies fatigue, fever, weight loss,weakness, recent illness HEENT- denies eye drainage, change in vision, nasal discharge, CVS- denies chest pain, palpitations RESP- denies SOB, cough, wheeze ABD- denies N/V, change in stools, abd pain GU- denies dysuria, hematuria, dribbling, incontinence MSK- +joint pain, denies muscle aches, injury Neuro- denies headache, dizziness, syncope, seizure activity  Physical:vitals reviewed GEN- NAD, alert and oriented x3 HEENT- PERRL, EOMI, non injected sclera, pink conjunctiva, MMM, oropharynx clear, TM clear , Right canal impacted wax, left canal clear, hearing aides bila Neck- Supple, no thryomegaly, no bruit CVS- RRR, no murmur RESP-CTAB ABD-NABS,soft,NT,ND Psych normal affect and mood GU- normal rectal tone Prostate smooth, no nodules, FOBT neg, soft stool in vault  EXT- No  edema Pulses- Radial, DP- 2+    Assessment:    Annual wellness medicare exam   Plan:    During the course of the visit the patient was educated and counseled about appropriate screening and preventive services including:   Prevention UTD- declines shingles vaccine   DM- , diet controlled , goal  less than 7%, eye exam UTD   recheck A1C next visit   HTn well controlled with diet   HLD lipids much improved in December, TG now only mildly elevated at  162 , previously 280, LDL down to 73   continues with ID clinic for his HIV    Chronic insomnia maintained on trazodone   PSA negative in Oct 2021   DDD Neck anticipating upcoming surgery, history of fusiom, continue ultram   Right cerumen impaction, removed at bedside    audit c/fall/drepssion screen negative   given handout on advanced directives    Needs PSA screening done  Diet review for nutrition referral? Yes ____ Not Indicated __x__  Patient Instructions (the written plan) was given to the patient.  Medicare Attestation  I have personally reviewed:  The patient's medical and social history  Their use of alcohol, tobacco or illicit drugs  Their current medications and supplements  The patient's functional ability including ADLs,fall risks, home safety risks, cognitive, and hearing and visual impairment  Diet and physical activities  Evidence for depression or mood disorders  The patient's weight, height, BMI, and visual acuity have been recorded in the chart. I have made referrals, counseling, and provided education to the patient based on review of the above and I have provided the patient with a written personalized care plan for preventive services.

## 2021-01-08 LAB — HEMOGLOBIN A1C
Hgb A1c MFr Bld: 6.2 % of total Hgb — ABNORMAL HIGH (ref <5.7)
Mean Plasma Glucose: 131 mg/dL
eAG (mmol/L): 7.3 mmol/L

## 2021-01-10 ENCOUNTER — Encounter: Payer: Self-pay | Admitting: *Deleted

## 2021-01-21 DIAGNOSIS — H2511 Age-related nuclear cataract, right eye: Secondary | ICD-10-CM | POA: Diagnosis not present

## 2021-01-22 NOTE — Telephone Encounter (Signed)
AO please advise. Thanks  

## 2021-01-22 NOTE — H&P (Signed)
Surgical History & Physical  Patient Name: Hunter Pratt DOB: Jul 27, 1956  Surgery: Cataract extraction with intraocular lens implant phacoemulsification; Right Eye  Surgeon: Baruch Goldmann MD Surgery Date:  01/28/2021 Pre-Op Date:  01/22/2021  HPI: A 77 Yr. old male patient Pt referred by Dr. Hassell Done for cataract evaluation. The patient complains of difficulty when driving at night, which began many years ago. Both eyes are affected. The episode is constant. The condition's severity is worsening. The complaint is associated with glare and halos. Pt c/o droopy lids OU. Symptoms are negatively affecting pt's quality if life. The patient experiences no increase floaters. Fasting BS-117 this am A1C%- 5.9 (Oct 2021) HPI was performed by Baruch Goldmann .  Medical History: Cataracts Macula Degeneration Acid Reflux HIV positive Hypercholesterolemia S... Diabetes - DM Type 2 Heart Problem  Review of Systems Negative Allergic/Immunologic Negative Cardiovascular Negative Constitutional Negative Ear, Nose, Mouth & Throat Negative Endocrine Negative Eyes Negative Gastrointestinal Negative Genitourinary Negative Hemotologic/Lymphatic Negative Integumentary Negative Musculoskeletal Negative Neurological Negative Psychiatry Negative Respiratory  Social   Former smoker   Medication Fluocinonide, Atorvastatin, Omeprazole, Tramadol hydrochloride, Trazodone, Biktarvy,   Sx/Procedures Neck/spine,   Drug Allergies  Black dye, Aleve,   History & Physical: Heent:  Cataract, Right eye NECK: supple without bruits LUNGS: lungs clear to auscultation CV: regular rate and rhythm Abdomen: soft and non-tender  Impression & Plan: Assessment: 1.  NUCLEAR SCLEROSIS AGE RELATED; Both Eyes (H25.13) 2.  Diabetes Type 2 No retinopathy (E11.9) 3.  BLEPHARITIS; Right Upper Lid, Right Lower Lid, Left Upper Lid, Left Lower Lid (H01.001, H01.002,H01.004,H01.005)  Plan: 1.  Cataract accounts for the  patient's decreased vision. This visual impairment is not correctable with a tolerable change in glasses or contact lenses. Cataract surgery with an implantation of a new lens should significantly improve the visual and functional status of the patient. Discussed all risks, benefits, alternatives, and potential complications. Discussed the procedures and recovery. Patient desires to have surgery. A-scan ordered and performed today for intra-ocular lens calculations. The surgery will be performed in order to improve vision for driving, reading, and for eye examinations. Recommend phacoemulsification with intra-ocular lens. Recommend Dextenza for post-operative pain and inflammation. Right Eye. Dilates well - shugarcaine by protocol. 2.  Stressed importance of blood sugar and blood pressure control, and also yearly eye examinations. Discussed the need for ongoing proactive ocular exams and treatment, hopefully before visual symptoms develop. 3.  Recommend regular lid cleaning.

## 2021-01-23 ENCOUNTER — Other Ambulatory Visit: Payer: Self-pay | Admitting: Family Medicine

## 2021-01-23 ENCOUNTER — Encounter (HOSPITAL_COMMUNITY)
Admission: RE | Admit: 2021-01-23 | Discharge: 2021-01-23 | Disposition: A | Discharge status: Home / Self Care | Payer: Medicare HMO | Source: Ambulatory Visit | Attending: Ophthalmology | Admitting: Ophthalmology

## 2021-01-23 NOTE — Telephone Encounter (Signed)
Ok to refill??  Last office visit /refill 01/07/2021.

## 2021-01-25 ENCOUNTER — Other Ambulatory Visit (HOSPITAL_COMMUNITY): Payer: Medicare HMO

## 2021-01-25 ENCOUNTER — Other Ambulatory Visit: Payer: Self-pay

## 2021-01-25 ENCOUNTER — Other Ambulatory Visit (HOSPITAL_COMMUNITY)
Admission: RE | Admit: 2021-01-25 | Discharge: 2021-01-25 | Disposition: A | Discharge status: Home / Self Care | Payer: Medicare HMO | Source: Ambulatory Visit | Attending: Ophthalmology | Admitting: Ophthalmology

## 2021-01-25 DIAGNOSIS — Z01812 Encounter for preprocedural laboratory examination: Secondary | ICD-10-CM | POA: Insufficient documentation

## 2021-01-25 DIAGNOSIS — Z20822 Contact with and (suspected) exposure to covid-19: Secondary | ICD-10-CM | POA: Insufficient documentation

## 2021-01-25 LAB — SARS CORONAVIRUS 2 (TAT 6-24 HRS): SARS Coronavirus 2: NEGATIVE

## 2021-01-26 ENCOUNTER — Other Ambulatory Visit: Payer: Self-pay | Admitting: Family Medicine

## 2021-01-28 ENCOUNTER — Ambulatory Visit (HOSPITAL_COMMUNITY)
Admission: RE | Admit: 2021-01-28 | Discharge: 2021-01-28 | Disposition: A | Discharge status: Home / Self Care | Payer: Medicare HMO | Attending: Ophthalmology | Admitting: Ophthalmology

## 2021-01-28 ENCOUNTER — Encounter (HOSPITAL_COMMUNITY)
Admission: RE | Disposition: A | Discharge status: Home / Self Care | Payer: Self-pay | Source: Home / Self Care | Attending: Ophthalmology

## 2021-01-28 ENCOUNTER — Ambulatory Visit (HOSPITAL_COMMUNITY): Payer: Medicare HMO | Admitting: Anesthesiology

## 2021-01-28 ENCOUNTER — Encounter (HOSPITAL_COMMUNITY): Payer: Self-pay | Admitting: Ophthalmology

## 2021-01-28 ENCOUNTER — Other Ambulatory Visit: Payer: Self-pay

## 2021-01-28 DIAGNOSIS — E1136 Type 2 diabetes mellitus with diabetic cataract: Secondary | ICD-10-CM | POA: Insufficient documentation

## 2021-01-28 DIAGNOSIS — H2511 Age-related nuclear cataract, right eye: Secondary | ICD-10-CM | POA: Diagnosis not present

## 2021-01-28 DIAGNOSIS — Z87891 Personal history of nicotine dependence: Secondary | ICD-10-CM | POA: Diagnosis not present

## 2021-01-28 DIAGNOSIS — Z886 Allergy status to analgesic agent status: Secondary | ICD-10-CM | POA: Diagnosis not present

## 2021-01-28 DIAGNOSIS — I509 Heart failure, unspecified: Secondary | ICD-10-CM | POA: Diagnosis not present

## 2021-01-28 HISTORY — PX: CATARACT EXTRACTION W/PHACO: SHX586

## 2021-01-28 LAB — GLUCOSE, CAPILLARY: Glucose-Capillary: 130 mg/dL — ABNORMAL HIGH (ref 70–99)

## 2021-01-28 SURGERY — PHACOEMULSIFICATION, CATARACT, WITH IOL INSERTION
Anesthesia: Monitor Anesthesia Care | Site: Eye | Laterality: Right

## 2021-01-28 MED ORDER — TROPICAMIDE 1 % OP SOLN: 1.0000 [drp] | OPHTHALMIC | Status: AC

## 2021-01-28 MED ORDER — LIDOCAINE HCL 3.5 % OP GEL
1.0000 "application " | Freq: Once | OPHTHALMIC | Status: AC
  Administered 2021-01-28: 1 via OPHTHALMIC

## 2021-01-28 MED ORDER — PHENYLEPHRINE-KETOROLAC 1-0.3 % IO SOLN
INTRAOCULAR | Status: DC | PRN
  Administered 2021-01-28: 500 mL via OPHTHALMIC

## 2021-01-28 MED ORDER — PROVISC 10 MG/ML IO SOLN
INTRAOCULAR | Status: DC | PRN
  Administered 2021-01-28: 0.85 mL via INTRAOCULAR

## 2021-01-28 MED ORDER — MIDAZOLAM HCL 2 MG/2ML IJ SOLN
INTRAMUSCULAR | Status: AC
  Filled 2021-01-28: qty 2

## 2021-01-28 MED ORDER — TROPICAMIDE 1 % OP SOLN
1.0000 [drp] | Freq: Once | OPHTHALMIC | Status: AC
  Administered 2021-01-28: 1 [drp] via OPHTHALMIC

## 2021-01-28 MED ORDER — NEOMYCIN-POLYMYXIN-DEXAMETH 3.5-10000-0.1 OP SUSP
OPHTHALMIC | Status: DC | PRN
  Administered 2021-01-28: 1 [drp] via OPHTHALMIC

## 2021-01-28 MED ORDER — STERILE WATER FOR IRRIGATION IR SOLN
Status: DC | PRN
  Administered 2021-01-28: 250 mL

## 2021-01-28 MED ORDER — SODIUM CHLORIDE (PF) 0.9 % IJ SOLN
INTRAMUSCULAR | Status: AC
  Filled 2021-01-28: qty 10

## 2021-01-28 MED ORDER — SODIUM HYALURONATE 23 MG/ML IO SOLN
INTRAOCULAR | Status: DC | PRN
  Administered 2021-01-28: 0.6 mL via INTRAOCULAR

## 2021-01-28 MED ORDER — PHENYLEPHRINE-KETOROLAC 1-0.3 % IO SOLN
INTRAOCULAR | Status: AC
  Filled 2021-01-28: qty 4

## 2021-01-28 MED ORDER — POVIDONE-IODINE 5 % OP SOLN
OPHTHALMIC | Status: DC | PRN
  Administered 2021-01-28: 1 via OPHTHALMIC

## 2021-01-28 MED ORDER — SODIUM CHLORIDE 0.9% FLUSH
INTRAVENOUS | Status: DC | PRN
  Administered 2021-01-28: 5 mL via INTRAVENOUS

## 2021-01-28 MED ORDER — PHENYLEPHRINE HCL 2.5 % OP SOLN
1.0000 [drp] | OPHTHALMIC | Status: AC | PRN
  Administered 2021-01-28 (×3): 1 [drp] via OPHTHALMIC

## 2021-01-28 MED ORDER — BSS IO SOLN
INTRAOCULAR | Status: DC | PRN
  Administered 2021-01-28: 15 mL via INTRAOCULAR

## 2021-01-28 MED ORDER — LIDOCAINE HCL (PF) 1 % IJ SOLN
INTRAOCULAR | Status: DC | PRN
  Administered 2021-01-28: 1 mL via OPHTHALMIC

## 2021-01-28 MED ORDER — TETRACAINE HCL 0.5 % OP SOLN
1.0000 [drp] | OPHTHALMIC | Status: AC | PRN
  Administered 2021-01-28 (×3): 1 [drp] via OPHTHALMIC

## 2021-01-28 MED ORDER — MIDAZOLAM HCL 2 MG/2ML IJ SOLN
INTRAMUSCULAR | Status: DC | PRN
  Administered 2021-01-28: 1 mg via INTRAVENOUS

## 2021-01-28 SURGICAL SUPPLY — 13 items
CLOTH BEACON ORANGE TIMEOUT ST (SAFETY) ×1 IMPLANT
EYE SHIELD UNIVERSAL CLEAR (GAUZE/BANDAGES/DRESSINGS) ×1 IMPLANT
GLOVE SURG UNDER POLY LF SZ6.5 (GLOVE) ×1 IMPLANT
GLOVE SURG UNDER POLY LF SZ7 (GLOVE) ×1 IMPLANT
NDL HYPO 18GX1.5 BLUNT FILL (NEEDLE) IMPLANT
NEEDLE HYPO 18GX1.5 BLUNT FILL (NEEDLE) ×2 IMPLANT
PAD ARMBOARD 7.5X6 YLW CONV (MISCELLANEOUS) ×1 IMPLANT
SYR TB 1ML LL NO SAFETY (SYRINGE) ×1 IMPLANT
TAPE SURG TRANSPORE 1 IN (GAUZE/BANDAGES/DRESSINGS) IMPLANT
TAPE SURGICAL TRANSPORE 1 IN (GAUZE/BANDAGES/DRESSINGS) ×2
Tecins IOL (Intraocular Lens) ×1 IMPLANT
VISCOELASTIC ADDITIONAL (OPHTHALMIC RELATED) IMPLANT
WATER STERILE IRR 250ML POUR (IV SOLUTION) ×1 IMPLANT

## 2021-01-28 NOTE — Op Note (Signed)
Date of procedure: 01/28/21  Pre-operative diagnosis: Visually significant age-related nuclear cataract, Right Eye (H25.11)  Post-operative diagnosis: Visually significant age-related nuclear cataract, Right Eye  Procedure: Removal of cataract via phacoemulsification and insertion of intra-ocular lens Wynetta Emery and [place]  +22.5D into the capsular bag of the Right Eye  Attending surgeon: Gerda Diss. Lourine Alberico, MD, MA  Anesthesia: MAC, Topical Akten  Complications: None  Estimated Blood Loss: <80m (minimal)  Specimens: None  Implants: As above  Indications:  Visually significant age-related cataract, Right Eye  Procedure:  The patient was seen and identified in the pre-operative area. The operative eye was identified and dilated.  The operative eye was marked.  Topical anesthesia was administered to the operative eye.     The patient was then to the operative suite and placed in the supine position.  A timeout was performed confirming the patient, procedure to be performed, and all other relevant information.   The patient's face was prepped and draped in the usual fashion for intra-ocular surgery.  A lid speculum was placed into the operative eye and the surgical microscope moved into place and focused.  A superotemporal paracentesis was created using a 20 gauge paracentesis blade.  Shugarcaine was injected into the anterior chamber.  Viscoelastic was injected into the anterior chamber.  A temporal clear-corneal main wound incision was created using a 2.461mmicrokeratome.  A continuous curvilinear capsulorrhexis was initiated using an irrigating cystitome and completed using capsulorrhexis forceps.  Hydrodissection and hydrodeliniation were performed.  Viscoelastic was injected into the anterior chamber.  A phacoemulsification handpiece and a chopper as a second instrument were used to remove the nucleus and epinucleus. The irrigation/aspiration handpiece was used to remove any  remaining cortical material.   The capsular bag was reinflated with viscoelastic, checked, and found to be intact.  The intraocular lens was inserted into the capsular bag.  The irrigation/aspiration handpiece was used to remove any remaining viscoelastic.  The clear corneal wound and paracentesis wounds were then hydrated and checked with Weck-Cels to be watertight.  The lid-speculum and drape was removed, and the patient's face was cleaned with a wet and dry 4x4.  Maxitrol was instilled in the eye before a clear shield was taped over the eye. The patient was taken to the post-operative care unit in good condition, having tolerated the procedure well.  Post-Op Instructions: [facility] and will receive all other orders and instructions.

## 2021-01-28 NOTE — Anesthesia Preprocedure Evaluation (Addendum)
Anesthesia Evaluation  Patient identified by MRN, date of birth, ID band Patient awake    Reviewed: Allergy & Precautions, NPO status , Patient's Chart, lab work & pertinent test results  History of Anesthesia Complications Negative for: history of anesthetic complications  Airway Mallampati: II  TM Distance: >3 FB Neck ROM: Limited    Dental  (+) Upper Dentures, Lower Dentures   Pulmonary sleep apnea , former smoker,    Pulmonary exam normal breath sounds clear to auscultation       Cardiovascular Exercise Tolerance: Good hypertension, Pt. on medications +CHF (EF 40-45%)  Normal cardiovascular exam Rhythm:Regular Rate:Normal     Neuro/Psych PSYCHIATRIC DISORDERS  Neuromuscular disease    GI/Hepatic PUD, GERD  Controlled and Medicated,  Endo/Other  diabetes, Well Controlled, Type 2, Oral Hypoglycemic Agents  Renal/GU Renal InsufficiencyRenal disease     Musculoskeletal  (+) Arthritis  (neck fusion),   Abdominal   Peds  Hematology  (+) anemia , HIV,   Anesthesia Other Findings   Reproductive/Obstetrics negative OB ROS                             Anesthesia Physical Anesthesia Plan  ASA: III  Anesthesia Plan: MAC   Post-op Pain Management:    Induction:   PONV Risk Score and Plan:   Airway Management Planned: Nasal Cannula and Natural Airway  Additional Equipment:   Intra-op Plan:   Post-operative Plan:   Informed Consent: I have reviewed the patients History and Physical, chart, labs and discussed the procedure including the risks, benefits and alternatives for the proposed anesthesia with the patient or authorized representative who has indicated his/her understanding and acceptance.     Dental advisory given  Plan Discussed with: CRNA and Surgeon  Anesthesia Plan Comments:        Anesthesia Quick Evaluation

## 2021-01-28 NOTE — Interval H&P Note (Signed)
History and Physical Interval Note:  01/28/2021 7:54 AM  Hunter Pratt  has presented today for surgery, with the diagnosis of Nuclear sclerotic cataract - Right eye.  The various methods of treatment have been discussed with the patient and family. After consideration of risks, benefits and other options for treatment, the patient has consented to  Procedure(s) with comments: CATARACT EXTRACTION PHACO AND INTRAOCULAR LENS PLACEMENT (IOC) (Right) - right as a surgical intervention.  The patient's history has been reviewed, patient examined, no change in status, stable for surgery.  I have reviewed the patient's chart and labs.  Questions were answered to the patient's satisfaction.     Baruch Goldmann

## 2021-01-28 NOTE — Transfer of Care (Signed)
Immediate Anesthesia Transfer of Care Note  Patient: Hunter Pratt  Procedure(s) Performed: CATARACT EXTRACTION PHACO AND INTRAOCULAR LENS PLACEMENT (IOC) (Right Eye)  Patient Location: Short Stay  Anesthesia Type:MAC  Level of Consciousness: awake, alert  and oriented  Airway & Oxygen Therapy: Patient Spontanous Breathing  Post-op Assessment: Report given to RN and Post -op Vital signs reviewed and stable  Post vital signs: Reviewed and stable  Last Vitals:  Vitals Value Taken Time  BP    Temp    Pulse    Resp    SpO2      Last Pain:  Vitals:   01/28/21 0732  TempSrc: Oral  PainSc: 0-No pain         Complications: No complications documented.

## 2021-01-28 NOTE — Discharge Instructions (Addendum)
Please discharge patient when stable, will follow up today with Dr. Wrzosek at the [facility] office immediately following discharge.  Leave shield in place until visit.  All paperwork with discharge instructions will be given at the office.   [facility] Address:  [address]  [place], [place] [address]             Monitored Anesthesia Care, Care After This sheet gives you information about how to care for yourself after your procedure. Your health care provider may also give you more specific instructions. If you have problems or questions, contact your health care provider. What can I expect after the procedure? After the procedure, it is common to have:  Tiredness.  Forgetfulness about what happened after the procedure.  Impaired judgment for important decisions.  Nausea or vomiting.  Some difficulty with balance. Follow these instructions at home: For the time period you were told by your health care provider:  Rest as needed.  Do not participate in activities where you could fall or become injured.  Do not drive or use machinery.  Do not drink alcohol.  Do not take sleeping pills or medicines that cause drowsiness.  Do not make important decisions or sign legal documents.  Do not take care of children on your own.      Eating and drinking  Follow the diet that is recommended by your health care provider.  Drink enough fluid to keep your urine pale yellow.  If you vomit: ? Drink water, juice, or soup when you can drink without vomiting. ? Make sure you have little or no nausea before eating solid foods. General instructions  Have a responsible adult stay with you for the time you are told. It is important to have someone help care for you until you are awake and alert.  Take over-the-counter and prescription medicines only as told by your health care provider.  If you have sleep apnea, surgery and certain  medicines can increase your risk for breathing problems. Follow instructions from your health care provider about wearing your sleep device: ? Anytime you are sleeping, including during daytime naps. ? While taking prescription pain medicines, sleeping medicines, or medicines that make you drowsy.  Avoid smoking.  Keep all follow-up visits as told by your health care provider. This is important. Contact a health care provider if:  You keep feeling nauseous or you keep vomiting.  You feel light-headed.  You are still sleepy or having trouble with balance after 24 hours.  You develop a rash.  You have a fever.  You have redness or swelling around the IV site. Get help right away if:  You have trouble breathing.  You have new-onset confusion at home. Summary  For several hours after your procedure, you may feel tired. You may also be forgetful and have poor judgment.  Have a responsible adult stay with you for the time you are told. It is important to have someone help care for you until you are awake and alert.  Rest as told. Do not drive or operate machinery. Do not drink alcohol or take sleeping pills.  Get help right away if you have trouble breathing, or if you suddenly become confused. This information is not intended to replace advice given to you by your health care provider. Make sure you discuss any questions you have with your health care provider. Document Revised: 08/02/2020 Document Reviewed: 10/20/2019 Elsevier Patient Education  2021 Elsevier Inc.  

## 2021-01-28 NOTE — Anesthesia Procedure Notes (Signed)
Procedure Name: MAC Date/Time: 01/28/2021 8:05 AM Performed by: Orlie Dakin, CRNA Pre-anesthesia Checklist: Patient identified, Emergency Drugs available, Suction available and Patient being monitored Patient Re-evaluated:Patient Re-evaluated prior to induction Oxygen Delivery Method: Nasal cannula Induction Type: IV induction Placement Confirmation: positive ETCO2

## 2021-01-28 NOTE — Anesthesia Postprocedure Evaluation (Signed)
Anesthesia Post Note  Patient: Hunter Pratt  Procedure(s) Performed: CATARACT EXTRACTION PHACO AND INTRAOCULAR LENS PLACEMENT (IOC) (Right Eye)  Patient location during evaluation: Phase II Anesthesia Type: MAC Level of consciousness: awake and alert and oriented Pain management: pain level controlled Vital Signs Assessment: post-procedure vital signs reviewed and stable Respiratory status: spontaneous breathing, nonlabored ventilation and respiratory function stable Cardiovascular status: blood pressure returned to baseline and stable Postop Assessment: no apparent nausea or vomiting Anesthetic complications: no   No complications documented.   Last Vitals:  Vitals:   01/28/21 0732  BP: 108/74  Pulse: 79  Resp: 19  Temp: 36.8 C  SpO2: 96%    Last Pain:  Vitals:   01/28/21 0732  TempSrc: Oral  PainSc: 0-No pain                 Orlie Dakin

## 2021-01-29 ENCOUNTER — Encounter (HOSPITAL_COMMUNITY): Payer: Self-pay | Admitting: Ophthalmology

## 2021-02-04 DIAGNOSIS — H2512 Age-related nuclear cataract, left eye: Secondary | ICD-10-CM | POA: Diagnosis not present

## 2021-02-05 NOTE — H&P (Signed)
Surgical History & Physical  Patient Name: Hunter Pratt DOB: 10/03/1956  Surgery: Cataract extraction with intraocular lens implant phacoemulsification; Left Eye  Surgeon: Hunter Goldmann MD Surgery Date:  02/11/2021 Pre-Op Date:  02/04/2021  HPI: A [name] PO-OD/Pre OS The patient is returning after cataract surgery. The right eye is affected. Status post cataract surgery, which began 1 week ago: Since the last visit, the affected area is doing well. The patient's vision is improved and stable. Patient is following medication instructions. The patient experiences no flashes, floater, shadow, curtain or veil. The patient complains of difficulty when driving at night, which began many years ago. The left eye is affected. The episode is constant. The condition's severity is worsening. The complaint is associated with glare and halos. Pt c/o droopy lids OU. Symptoms are negatively affecting pt's quality if life. HPI was performed by Hunter Pratt .  Medical History: Cataracts Macula Degeneration Acid Reflux HIV positive Hypercholesterolemia S... Diabetes - DM Type 2 Heart Problem  Review of Systems Negative Allergic/Immunologic Negative Cardiovascular Negative Constitutional Negative Ear, Nose, Mouth & Throat Negative Endocrine Negative Eyes Negative Gastrointestinal Negative Genitourinary Negative Hemotologic/Lymphatic Negative Integumentary Negative Musculoskeletal Negative Neurological Negative Psychiatry Negative Respiratory  Social   Former smoker   Medication Ilevro, Moxifloxacin, Prednisolone acetate 1%,  Fluocinonide, Atorvastatin, Omeprazole, Tramadol hydrochloride, Trazodone, Biktarvy,   Sx/Procedures Phaco c IOL OD,  Neck/spine,   Drug Allergies  Black dye, Aleve,   History & Physical: Heent:  Cataract, Left eye NECK: supple without bruits LUNGS: lungs clear to auscultation CV: regular rate and rhythm Abdomen: soft and  non-tender  Impression & Plan: Assessment: 1.  CATARACT EXTRACTION STATUS; Right Eye (Z98.41) 2.  NUCLEAR SCLEROSIS AGE RELATED; , Left Eye (H25.12)  Plan: 1.  1 week after cataract surgery. Doing well with improved vision and normal eye pressure. Call with any problems or concerns. Stop Vigamox. Continue Ilevro 1 drop 1x/day for 3 more weeks. Continue Pred Acetate 1 drop 2x/day for 3 more weeks. 2.  Cataract accounts for the patient's decreased vision. This visual impairment is not correctable with a tolerable change in glasses or contact lenses. Cataract surgery with an implantation of a new lens should significantly improve the visual and functional status of the patient. Discussed all risks, benefits, alternatives, and potential complications. Discussed the procedures and recovery. Patient desires to have surgery. A-scan ordered and performed today for intra-ocular lens calculations. The surgery will be performed in order to improve vision for driving, reading, and for eye examinations. Recommend phacoemulsification with intra-ocular lens. Recommend Dextenza for post-operative pain and inflammation. Left Eye. Dilates well - shugarcaine by protocol.

## 2021-02-06 ENCOUNTER — Other Ambulatory Visit: Payer: Self-pay

## 2021-02-06 ENCOUNTER — Encounter (HOSPITAL_COMMUNITY)
Admission: RE | Admit: 2021-02-06 | Discharge: 2021-02-06 | Disposition: A | Discharge status: Home / Self Care | Payer: Medicare HMO | Source: Ambulatory Visit | Attending: Ophthalmology | Admitting: Ophthalmology

## 2021-02-06 ENCOUNTER — Encounter (HOSPITAL_COMMUNITY): Payer: Self-pay

## 2021-02-08 ENCOUNTER — Other Ambulatory Visit (HOSPITAL_COMMUNITY)
Admission: RE | Admit: 2021-02-08 | Discharge: 2021-02-08 | Disposition: A | Discharge status: Home / Self Care | Payer: Medicare HMO | Source: Ambulatory Visit | Attending: Ophthalmology | Admitting: Ophthalmology

## 2021-02-08 ENCOUNTER — Other Ambulatory Visit: Payer: Self-pay

## 2021-02-08 DIAGNOSIS — Z01812 Encounter for preprocedural laboratory examination: Secondary | ICD-10-CM | POA: Diagnosis not present

## 2021-02-08 DIAGNOSIS — Z20822 Contact with and (suspected) exposure to covid-19: Secondary | ICD-10-CM | POA: Insufficient documentation

## 2021-02-09 LAB — SARS CORONAVIRUS 2 (TAT 6-24 HRS): SARS Coronavirus 2: NEGATIVE

## 2021-02-11 ENCOUNTER — Ambulatory Visit (HOSPITAL_COMMUNITY)
Admission: RE | Admit: 2021-02-11 | Discharge: 2021-02-11 | Disposition: A | Discharge status: Home / Self Care | Payer: Medicare HMO | Attending: Ophthalmology | Admitting: Ophthalmology

## 2021-02-11 ENCOUNTER — Ambulatory Visit (HOSPITAL_COMMUNITY): Payer: Medicare HMO | Admitting: Anesthesiology

## 2021-02-11 ENCOUNTER — Other Ambulatory Visit: Payer: Self-pay

## 2021-02-11 ENCOUNTER — Encounter (HOSPITAL_COMMUNITY)
Admission: RE | Disposition: A | Discharge status: Home / Self Care | Payer: Self-pay | Source: Home / Self Care | Attending: Ophthalmology

## 2021-02-11 ENCOUNTER — Encounter (HOSPITAL_COMMUNITY): Payer: Self-pay | Admitting: Ophthalmology

## 2021-02-11 DIAGNOSIS — G4733 Obstructive sleep apnea (adult) (pediatric): Secondary | ICD-10-CM | POA: Diagnosis not present

## 2021-02-11 DIAGNOSIS — Z87891 Personal history of nicotine dependence: Secondary | ICD-10-CM | POA: Insufficient documentation

## 2021-02-11 DIAGNOSIS — Z21 Asymptomatic human immunodeficiency virus [HIV] infection status: Secondary | ICD-10-CM | POA: Insufficient documentation

## 2021-02-11 DIAGNOSIS — H2512 Age-related nuclear cataract, left eye: Secondary | ICD-10-CM | POA: Insufficient documentation

## 2021-02-11 DIAGNOSIS — Z9841 Cataract extraction status, right eye: Secondary | ICD-10-CM | POA: Diagnosis not present

## 2021-02-11 DIAGNOSIS — E1136 Type 2 diabetes mellitus with diabetic cataract: Secondary | ICD-10-CM | POA: Insufficient documentation

## 2021-02-11 DIAGNOSIS — R69 Illness, unspecified: Secondary | ICD-10-CM | POA: Diagnosis not present

## 2021-02-11 HISTORY — PX: CATARACT EXTRACTION W/PHACO: SHX586

## 2021-02-11 LAB — GLUCOSE, CAPILLARY: Glucose-Capillary: 132 mg/dL — ABNORMAL HIGH (ref 70–99)

## 2021-02-11 SURGERY — PHACOEMULSIFICATION, CATARACT, WITH IOL INSERTION
Anesthesia: Monitor Anesthesia Care | Site: Eye | Laterality: Left

## 2021-02-11 MED ORDER — MIDAZOLAM HCL 5 MG/5ML IJ SOLN
INTRAMUSCULAR | Status: DC | PRN
  Administered 2021-02-11: 2 mg via INTRAVENOUS

## 2021-02-11 MED ORDER — SODIUM HYALURONATE 23 MG/ML IO SOLN
INTRAOCULAR | Status: DC | PRN
  Administered 2021-02-11: 0.6 mL via INTRAOCULAR

## 2021-02-11 MED ORDER — BSS IO SOLN
INTRAOCULAR | Status: DC | PRN
  Administered 2021-02-11: 15 mL via INTRAOCULAR

## 2021-02-11 MED ORDER — LIDOCAINE HCL (PF) 1 % IJ SOLN
INTRAOCULAR | Status: DC | PRN
  Administered 2021-02-11: 1 mL via OPHTHALMIC

## 2021-02-11 MED ORDER — MIDAZOLAM HCL 2 MG/2ML IJ SOLN
INTRAMUSCULAR | Status: AC
  Filled 2021-02-11: qty 2

## 2021-02-11 MED ORDER — STERILE WATER FOR IRRIGATION IR SOLN
Status: DC | PRN
  Administered 2021-02-11: 250 mL

## 2021-02-11 MED ORDER — POVIDONE-IODINE 5 % OP SOLN
OPHTHALMIC | Status: DC | PRN
  Administered 2021-02-11: 1 via OPHTHALMIC

## 2021-02-11 MED ORDER — NEOMYCIN-POLYMYXIN-DEXAMETH 3.5-10000-0.1 OP SUSP
OPHTHALMIC | Status: DC | PRN
  Administered 2021-02-11: 1 [drp] via OPHTHALMIC

## 2021-02-11 MED ORDER — PHENYLEPHRINE HCL 2.5 % OP SOLN
1.0000 [drp] | OPHTHALMIC | Status: AC | PRN
  Administered 2021-02-11 (×3): 1 [drp] via OPHTHALMIC

## 2021-02-11 MED ORDER — LIDOCAINE HCL 3.5 % OP GEL
1.0000 "application " | Freq: Once | OPHTHALMIC | Status: AC
  Administered 2021-02-11: 1 via OPHTHALMIC

## 2021-02-11 MED ORDER — EPINEPHRINE PF 1 MG/ML IJ SOLN
INTRAMUSCULAR | Status: AC
  Filled 2021-02-11: qty 2

## 2021-02-11 MED ORDER — TETRACAINE HCL 0.5 % OP SOLN
1.0000 [drp] | OPHTHALMIC | Status: AC | PRN
  Administered 2021-02-11 (×3): 1 [drp] via OPHTHALMIC

## 2021-02-11 MED ORDER — PROVISC 10 MG/ML IO SOLN
INTRAOCULAR | Status: DC | PRN
  Administered 2021-02-11: 0.85 mL via INTRAOCULAR

## 2021-02-11 MED ORDER — EPINEPHRINE PF 1 MG/ML IJ SOLN
INTRAOCULAR | Status: DC | PRN
  Administered 2021-02-11: 500 mL

## 2021-02-11 MED ORDER — TROPICAMIDE 1 % OP SOLN
1.0000 [drp] | OPHTHALMIC | Status: AC
  Administered 2021-02-11 (×3): 1 [drp] via OPHTHALMIC

## 2021-02-11 SURGICAL SUPPLY — 13 items
CLOTH BEACON ORANGE TIMEOUT ST (SAFETY) ×1 IMPLANT
EYE SHIELD UNIVERSAL CLEAR (GAUZE/BANDAGES/DRESSINGS) ×1 IMPLANT
GLOVE SURG UNDER POLY LF SZ6.5 (GLOVE) ×1 IMPLANT
GLOVE SURG UNDER POLY LF SZ7 (GLOVE) ×1 IMPLANT
NDL HYPO 18GX1.5 BLUNT FILL (NEEDLE) IMPLANT
NEEDLE HYPO 18GX1.5 BLUNT FILL (NEEDLE) ×2 IMPLANT
PAD ARMBOARD 7.5X6 YLW CONV (MISCELLANEOUS) ×1 IMPLANT
SYR TB 1ML LL NO SAFETY (SYRINGE) ×1 IMPLANT
TAPE SURG TRANSPORE 1 IN (GAUZE/BANDAGES/DRESSINGS) IMPLANT
TAPE SURGICAL TRANSPORE 1 IN (GAUZE/BANDAGES/DRESSINGS) ×2
TECNIS IOL (Intraocular Lens) ×1 IMPLANT
VISCOELASTIC ADDITIONAL (OPHTHALMIC RELATED) IMPLANT
WATER STERILE IRR 250ML POUR (IV SOLUTION) ×1 IMPLANT

## 2021-02-11 NOTE — Anesthesia Postprocedure Evaluation (Signed)
Anesthesia Post Note  Patient: Hunter Pratt  Procedure(s) Performed: CATARACT EXTRACTION PHACO AND INTRAOCULAR LENS PLACEMENT (IOC) (Left Eye)  Patient location during evaluation: Short Stay Anesthesia Type: MAC Level of consciousness: awake and alert and oriented Pain management: pain level controlled Vital Signs Assessment: post-procedure vital signs reviewed and stable Respiratory status: spontaneous breathing Cardiovascular status: blood pressure returned to baseline and stable Postop Assessment: no apparent nausea or vomiting Anesthetic complications: no   No complications documented.   Last Vitals:  Vitals:   02/11/21 1128  BP: 130/90  Pulse: 79  Resp: 17  Temp: 36.9 C  SpO2: 97%    Last Pain:  Vitals:   02/11/21 1128  TempSrc: Oral  PainSc: 0-No pain                 Naida Escalante

## 2021-02-11 NOTE — Transfer of Care (Signed)
Immediate Anesthesia Transfer of Care Note  Patient: Hunter Pratt  Procedure(s) Performed: CATARACT EXTRACTION PHACO AND INTRAOCULAR LENS PLACEMENT (IOC) (Left Eye)  Patient Location: Short Stay  Anesthesia Type:MAC  Level of Consciousness: awake  Airway & Oxygen Therapy: Patient Spontanous Breathing  Post-op Assessment: Report given to RN  Post vital signs: Reviewed  Last Vitals:  Vitals Value Taken Time  BP    Temp    Pulse    Resp    SpO2      Last Pain:  Vitals:   02/11/21 1128  TempSrc: Oral  PainSc: 0-No pain      Patients Stated Pain Goal: 5 (24/46/28 6381)  Complications: No complications documented.

## 2021-02-11 NOTE — Discharge Instructions (Signed)
Please discharge patient when stable, will follow up today with Dr. Yosiah Jasmin at the [facility] office immediately following discharge.  Leave shield in place until visit.  All paperwork with discharge instructions will be given at the office.  [facility] Address:  [address]  [place], [place] [address]  

## 2021-02-11 NOTE — Op Note (Signed)
Date of procedure: 02/11/21  Pre-operative diagnosis: Visually significant age-related nuclear cataract, Left Eye (H25.12)  Post-operative diagnosis: Visually significant age-related nuclear cataract, Left Eye  Procedure: Removal of cataract via phacoemulsification and insertion of intra-ocular lens Hunter Pratt and [place]  +22.5D into the capsular bag of the Left Eye  Attending surgeon: Gerda Diss. Maysel Mccolm, MD, MA  Anesthesia: MAC, Topical Akten  Complications: None  Estimated Blood Loss: <32m (minimal)  Specimens: None  Implants: As above  Indications:  Visually significant age-related cataract, Left Eye  Procedure:  The patient was seen and identified in the pre-operative area. The operative eye was identified and dilated.  The operative eye was marked.  Topical anesthesia was administered to the operative eye.     The patient was then to the operative suite and placed in the supine position.  A timeout was performed confirming the patient, procedure to be performed, and all other relevant information.   The patient's face was prepped and draped in the usual fashion for intra-ocular surgery.  A lid speculum was placed into the operative eye and the surgical microscope moved into place and focused.  An inferotemporal paracentesis was created using a 20 gauge paracentesis blade.  Shugarcaine was injected into the anterior chamber.  Viscoelastic was injected into the anterior chamber.  A temporal clear-corneal main wound incision was created using a 2.478mmicrokeratome.  A continuous curvilinear capsulorrhexis was initiated using an irrigating cystitome and completed using capsulorrhexis forceps.  Hydrodissection and hydrodeliniation were performed.  Viscoelastic was injected into the anterior chamber.  A phacoemulsification handpiece and a chopper as a second instrument were used to remove the nucleus and epinucleus. The irrigation/aspiration handpiece was used to remove any remaining  cortical material.   The capsular bag was reinflated with viscoelastic, checked, and found to be intact.  The intraocular lens was inserted into the capsular bag.  The irrigation/aspiration handpiece was used to remove any remaining viscoelastic.  The clear corneal wound and paracentesis wounds were then hydrated and checked with Weck-Cels to be watertight.  The lid-speculum and drape was removed, and the patient's face was cleaned with a wet and dry 4x4.  Maxitrol was instilled in the eye before a clear shield was taped over the eye. The patient was taken to the post-operative care unit in good condition, having tolerated the procedure well.  Post-Op Instructions: [facility] and will receive all other orders and instructions.

## 2021-02-11 NOTE — Interval H&P Note (Signed)
History and Physical Interval Note:  02/11/2021 1:24 PM  Hunter Pratt  has presented today for surgery, with the diagnosis of Nuclear sclerotic cataract - Left eye.  The various methods of treatment have been discussed with the patient and family. After consideration of risks, benefits and other options for treatment, the patient has consented to  Procedure(s) with comments: CATARACT EXTRACTION PHACO AND INTRAOCULAR LENS PLACEMENT (IOC) (Left) - CDE:  as a surgical intervention.  The patient's history has been reviewed, patient examined, no change in status, stable for surgery.  I have reviewed the patient's chart and labs.  Questions were answered to the patient's satisfaction.     Baruch Goldmann

## 2021-02-11 NOTE — Anesthesia Preprocedure Evaluation (Signed)
Anesthesia Evaluation  Patient identified by MRN, date of birth, ID band Patient awake    Reviewed: Allergy & Precautions, NPO status , Patient's Chart, lab work & pertinent test results  History of Anesthesia Complications Negative for: history of anesthetic complications  Airway Mallampati: II  TM Distance: >3 FB Neck ROM: Limited   Comment: Cervical Spinal stenosis Dental  (+) Upper Dentures, Lower Dentures   Pulmonary sleep apnea , former smoker,    Pulmonary exam normal breath sounds clear to auscultation       Cardiovascular Exercise Tolerance: Good hypertension, Pt. on medications +CHF (EF 40-45%)  Normal cardiovascular exam Rhythm:Regular Rate:Normal     Neuro/Psych PSYCHIATRIC DISORDERS  Neuromuscular disease    GI/Hepatic PUD, GERD  Controlled and Medicated,  Endo/Other  diabetes, Well Controlled, Type 2, Oral Hypoglycemic Agents  Renal/GU Renal InsufficiencyRenal disease     Musculoskeletal  (+) Arthritis  (neck fusion), Cervical Spinal stenosis   Abdominal   Peds  Hematology  (+) anemia , HIV,   Anesthesia Other Findings   Reproductive/Obstetrics negative OB ROS                            Anesthesia Physical  Anesthesia Plan  ASA: III  Anesthesia Plan: MAC   Post-op Pain Management:    Induction:   PONV Risk Score and Plan:   Airway Management Planned: Nasal Cannula and Natural Airway  Additional Equipment:   Intra-op Plan:   Post-operative Plan:   Informed Consent: I have reviewed the patients History and Physical, chart, labs and discussed the procedure including the risks, benefits and alternatives for the proposed anesthesia with the patient or authorized representative who has indicated his/her understanding and acceptance.     Dental advisory given  Plan Discussed with: CRNA and Surgeon  Anesthesia Plan Comments:         Anesthesia Quick  Evaluation

## 2021-02-12 ENCOUNTER — Encounter (HOSPITAL_COMMUNITY): Payer: Self-pay | Admitting: Ophthalmology

## 2021-02-14 ENCOUNTER — Ambulatory Visit: Payer: Medicare HMO | Admitting: Orthopaedic Surgery

## 2021-03-14 ENCOUNTER — Ambulatory Visit (HOSPITAL_COMMUNITY)
Admission: RE | Admit: 2021-03-14 | Discharge: 2021-03-14 | Disposition: A | Discharge status: Home / Self Care | Payer: Medicare HMO | Source: Ambulatory Visit | Attending: Gerontology | Admitting: Gerontology

## 2021-03-14 ENCOUNTER — Other Ambulatory Visit (HOSPITAL_COMMUNITY): Payer: Self-pay | Admitting: Gerontology

## 2021-03-14 ENCOUNTER — Other Ambulatory Visit: Payer: Self-pay

## 2021-03-14 DIAGNOSIS — M25551 Pain in right hip: Secondary | ICD-10-CM | POA: Diagnosis not present

## 2021-03-14 DIAGNOSIS — G4700 Insomnia, unspecified: Secondary | ICD-10-CM | POA: Diagnosis not present

## 2021-03-14 DIAGNOSIS — R69 Illness, unspecified: Secondary | ICD-10-CM | POA: Diagnosis not present

## 2021-03-14 DIAGNOSIS — K219 Gastro-esophageal reflux disease without esophagitis: Secondary | ICD-10-CM | POA: Diagnosis not present

## 2021-03-14 DIAGNOSIS — E119 Type 2 diabetes mellitus without complications: Secondary | ICD-10-CM | POA: Diagnosis not present

## 2021-03-18 ENCOUNTER — Encounter: Payer: Self-pay | Admitting: Family Medicine

## 2021-03-20 ENCOUNTER — Encounter: Payer: Self-pay | Admitting: Infectious Disease

## 2021-04-14 DIAGNOSIS — E785 Hyperlipidemia, unspecified: Secondary | ICD-10-CM | POA: Diagnosis not present

## 2021-04-14 DIAGNOSIS — E119 Type 2 diabetes mellitus without complications: Secondary | ICD-10-CM | POA: Diagnosis not present

## 2021-04-16 ENCOUNTER — Other Ambulatory Visit: Payer: Self-pay

## 2021-04-16 ENCOUNTER — Ambulatory Visit (INDEPENDENT_AMBULATORY_CARE_PROVIDER_SITE_OTHER): Payer: Medicare HMO

## 2021-04-16 ENCOUNTER — Ambulatory Visit: Payer: Medicare HMO | Admitting: Orthopaedic Surgery

## 2021-04-16 ENCOUNTER — Encounter: Payer: Self-pay | Admitting: Orthopaedic Surgery

## 2021-04-16 VITALS — BP 136/88 | HR 83

## 2021-04-16 DIAGNOSIS — M5442 Lumbago with sciatica, left side: Secondary | ICD-10-CM

## 2021-04-16 DIAGNOSIS — G8929 Other chronic pain: Secondary | ICD-10-CM | POA: Diagnosis not present

## 2021-04-16 DIAGNOSIS — M25562 Pain in left knee: Secondary | ICD-10-CM

## 2021-04-16 DIAGNOSIS — M170 Bilateral primary osteoarthritis of knee: Secondary | ICD-10-CM

## 2021-04-16 NOTE — Progress Notes (Signed)
Office Visit Note   Patient: Hunter Pratt           Date of Birth: Nov 03, 1956           MRN: [id number] Visit Date: 04/16/2021              Requested by: Rosita Fire, MD [place],   [id number] PCP: Rosita Fire, MD   Assessment & Plan: Visit Diagnoses:  1. Chronic pain of left knee   2. Left-sided low back pain with left-sided sciatica, unspecified chronicity     Plan: Left knee intra-articular injection performed.  He can follow-up with [place] in 2 months.  Follow-Up Instructions: No follow-ups on file.   Orders:  Orders Placed This Encounter  Procedures  . XR Lumbar Spine 2-3 Views  . XR KNEE 3 VIEW LEFT   No orders of the defined types were placed in this encounter.     Procedures: No procedures performed   Clinical Data: No additional findings.   Subjective: Chief Complaint  Patient presents with  . Left Knee - Pain  . Left Leg - Pain    HPI 65 year old male returns he is having some problems with back pain intermittently off and on his back "goes out".  More recently he was helping his mother states lifting moving activities and has had increased pain in his left knee primarily medial more than lateral.  No previous knee surgery no true locking.  He has pain with weightbearing pain with turning twisting and pain with hyperextension.  Patient has pseudoarthrosis at C4 4-5 after and instrumented fusion by Dr. Joya Salm many years ago currently states since wintertime his neck been doing better.  C5-6 level was fused first and is solid.  Review of Systems updated unchanged. Patient has diabetes not on any medication sugar never is above 140 states this morning was 90.  Objective: Vital Signs: BP 136/88   Pulse 83   Physical Exam Constitutional:      Appearance: He is well-developed.  HENT:     Head: Normocephalic and atraumatic.  Eyes:     Pupils: Pupils are equal, round, and reactive to light.  Neck:     Thyroid: No  thyromegaly.     Trachea: No tracheal deviation.  Cardiovascular:     Rate and Rhythm: Normal rate.  Pulmonary:     Effort: Pulmonary effort is normal.     Breath sounds: No wheezing.  Abdominal:     General: Bowel sounds are normal.     Palpations: Abdomen is soft.  Skin:    General: Skin is warm and dry.     Capillary Refill: Capillary refill takes less than 2 seconds.  Neurological:     Mental Status: He is alert and oriented to person, place, and time.  Psychiatric:        Behavior: Behavior normal.        Thought Content: Thought content normal.        Judgment: Judgment normal.     Ortho Exam patient has good rotational motion of the cervical spine without pain no pain with tilting.  Bilateral knee medial joint line tenderness most much worse on the left knee than right knee which extends down to the pes bursa pain with hyperextension ACL PCL exam is normal negative logroll of the hips.  He is amatory with a slight left knee limp.  Mild sciatic notch tenderness.  Specialty Comments:  No specialty comments available.  Imaging: No results  found.   PMFS History: Patient Active Problem List   Diagnosis Date Noted  . Spinal stenosis of cervical region 01/03/2021  . Cervical pseudoarthrosis ([place]) 11/01/2020  . Bilateral primary osteoarthritis of knee 09/20/2020  . Obesity (BMI 30.0-34.9) 05/22/2019  . Mastalgia 06/29/2018  . Tenosynovitis of left wrist 03/18/2017  . Constipation 01/28/2017  . Insomnia 03/03/2014  . Cervical spondylosis with radiculopathy 09/14/2013  . DDD (degenerative disc disease), cervical 08/11/2013  . HTN (hypertension) 07/27/2013  . Lymphadenopathy 03/04/2013  . Eczematous dermatitis 11/14/2012  . Systolic dysfunction 09/81/1914  . Diabetes type 2, controlled ([place]) 07/30/2012  . OSA (obstructive sleep apnea) 06/09/2012  . DEGENERATIVE JOINT DISEASE, KNEES, BILATERAL 01/30/2009  . Secondary polycythemia 05/22/2008  . Hyperlipidemia 04/21/2008   . GERD 04/21/2008  . MOOD DISORDER IN CONDITIONS CLASSIFIED ELSEWHERE 02/28/2008  . Hereditary and idiopathic peripheral neuropathy 02/28/2008  . PUD 01/03/2008  . Human immunodeficiency virus (HIV) disease ([place]) 12/28/2006   Past Medical History:  Diagnosis Date  . Abnormal white blood cell 01/27/2018  . Anemia    past hx   . Arthritis   . Cataract    Phreesia 01/04/2021  . Constipation 01/28/2017  . Creatinine elevation 07/09/2020  . DDD (degenerative disc disease), cervical   . Diabetes mellitus without complication ([place])    Phreesia 01/04/2021  . Diffuse abdominal pain 12/30/2018  . Ganglion cyst 01/28/2017  . GERD (gastroesophageal reflux disease)   . Hearing loss    Bilateral  . HIV infection ([place])   . Hyperlipidemia   . Kidney stone   . LLQ pain 12/28/2019  . OA (osteoarthritis) of knee   . Obesity 06/30/2019  . Osteoporosis   . PUD (peptic ulcer disease)   . Sleep apnea    dx'd no current cpap use  . Systolic dysfunction    EF 40-45%    Family History  Problem Relation Age of Onset  . Hypertension Mother   . Osteoporosis Mother   . Osteoporosis Father   . Depression Father   . Hearing loss Father   . Colon polyps Father   . Osteoporosis Sister   . Arthritis Sister   . Hypertension Brother   . Hyperlipidemia Brother   . Depression Brother   . Colon cancer Neg Hx   . Rectal cancer Neg Hx   . Stomach cancer Neg Hx   . Esophageal cancer Neg Hx     Past Surgical History:  Procedure Laterality Date  . CATARACT EXTRACTION W/PHACO Right 01/28/2021   Procedure: CATARACT EXTRACTION PHACO AND INTRAOCULAR LENS PLACEMENT (IOC);  Surgeon: Baruch Goldmann, MD;  Location: [facility];  Service: Ophthalmology;  Laterality: Right;  CDE: 4.05  . CATARACT EXTRACTION W/PHACO Left 02/11/2021   Procedure: CATARACT EXTRACTION PHACO AND INTRAOCULAR LENS PLACEMENT (IOC);  Surgeon: Baruch Goldmann, MD;  Location: [facility];  Service: Ophthalmology;  Laterality: Left;  CDE: 7.74  . CERVICAL  DISCECTOMY  1990 and 1991  . COLONOSCOPY    . GANGLION CYST EXCISION Left 03/18/2017   Procedure: REMOVAL GANGLION OF LEFT WRIST, TENOLYSIS;  Surgeon: Leandrew Koyanagi, MD;  Location: [facility];  Service: Orthopedics;  Laterality: Left;  . POLYPECTOMY    . SPINE SURGERY N/A    Phreesia 01/04/2021   Social History   Occupational History  . Not on file  Tobacco Use  . Smoking status: Former Smoker    Packs/day: 1.00    Years: 20.00    Pack years: 20.00    Types: Cigarettes  Quit date: 2008    Years since quitting: 14.3  . Smokeless tobacco: Never Used  Vaping Use  . Vaping Use: Never used  Substance and Sexual Activity  . Alcohol use: Not Currently  . Drug use: No  . Sexual activity: Not Currently    Comment: refused condoms

## 2021-05-03 DIAGNOSIS — G4733 Obstructive sleep apnea (adult) (pediatric): Secondary | ICD-10-CM | POA: Diagnosis not present

## 2021-05-22 DIAGNOSIS — R69 Illness, unspecified: Secondary | ICD-10-CM | POA: Diagnosis not present

## 2021-05-22 DIAGNOSIS — E119 Type 2 diabetes mellitus without complications: Secondary | ICD-10-CM | POA: Diagnosis not present

## 2021-05-28 ENCOUNTER — Other Ambulatory Visit: Payer: Self-pay

## 2021-05-28 ENCOUNTER — Other Ambulatory Visit: Payer: Medicare HMO

## 2021-05-28 DIAGNOSIS — B20 Human immunodeficiency virus [HIV] disease: Secondary | ICD-10-CM

## 2021-05-28 DIAGNOSIS — R69 Illness, unspecified: Secondary | ICD-10-CM | POA: Diagnosis not present

## 2021-05-29 LAB — T-HELPER CELL (CD4) - (RCID CLINIC ONLY)
CD4 % Helper T Cell: 28 % — ABNORMAL LOW (ref 33–65)
CD4 T Cell Abs: 604 /uL (ref 400–1790)

## 2021-05-31 DIAGNOSIS — G4733 Obstructive sleep apnea (adult) (pediatric): Secondary | ICD-10-CM | POA: Diagnosis not present

## 2021-05-31 LAB — COMPLETE METABOLIC PANEL WITH GFR
AG Ratio: 1.2 (calc) (ref 1.0–2.5)
ALT: 14 U/L (ref 9–46)
AST: 16 U/L (ref 10–35)
Albumin: 4.2 g/dL (ref 3.6–5.1)
Alkaline phosphatase (APISO): 109 U/L (ref 35–144)
BUN: 16 mg/dL (ref 7–25)
CO2: 29 mmol/L (ref 20–32)
Calcium: 10 mg/dL (ref 8.6–10.3)
Chloride: 101 mmol/L (ref 98–110)
Creat: 1.08 mg/dL (ref 0.70–1.25)
GFR, Est African American: 84 mL/min/{1.73_m2} (ref >=60)
GFR, Est Non African American: 73 mL/min/{1.73_m2} (ref >=60)
Globulin: 3.4 g/dL (calc) (ref 1.9–3.7)
Glucose, Bld: 109 mg/dL — ABNORMAL HIGH (ref 65–99)
Potassium: 4.4 mmol/L (ref 3.5–5.3)
Sodium: 138 mmol/L (ref 135–146)
Total Bilirubin: 0.3 mg/dL (ref 0.2–1.2)
Total Protein: 7.6 g/dL (ref 6.1–8.1)

## 2021-05-31 LAB — CBC WITH DIFFERENTIAL/PLATELET
Absolute Monocytes: 1058 cells/uL — ABNORMAL HIGH (ref 200–950)
Basophils Absolute: 119 cells/uL (ref 0–200)
Basophils Relative: 1.1 %
Eosinophils Absolute: 648 cells/uL — ABNORMAL HIGH (ref 15–500)
Eosinophils Relative: 6 %
HCT: 49.6 % (ref 38.5–50.0)
Hemoglobin: 17.1 g/dL (ref 13.2–17.1)
Lymphs Abs: 2279 cells/uL (ref 850–3900)
MCH: 32.8 pg (ref 27.0–33.0)
MCHC: 34.5 g/dL (ref 32.0–36.0)
MCV: 95.2 fL (ref 80.0–100.0)
MPV: 9.8 fL (ref 7.5–12.5)
Monocytes Relative: 9.8 %
Neutro Abs: 6696 cells/uL (ref 1500–7800)
Neutrophils Relative %: 62 %
Platelets: 320 10*3/uL (ref 140–400)
RBC: 5.21 10*6/uL (ref 4.20–5.80)
RDW: 12.6 % (ref 11.0–15.0)
Total Lymphocyte: 21.1 %
WBC: 10.8 10*3/uL (ref 3.8–10.8)

## 2021-05-31 LAB — HIV-1 RNA QUANT-NO REFLEX-BLD
HIV 1 RNA Quant: <20 Copies/mL — ABNORMAL HIGH
HIV-1 RNA Quant, Log: <1.3 Log cps/mL — ABNORMAL HIGH

## 2021-05-31 LAB — RPR: RPR Ser Ql: NONREACTIVE

## 2021-06-02 DIAGNOSIS — G4733 Obstructive sleep apnea (adult) (pediatric): Secondary | ICD-10-CM | POA: Diagnosis not present

## 2021-06-11 ENCOUNTER — Ambulatory Visit: Payer: Medicare HMO

## 2021-06-11 ENCOUNTER — Encounter: Payer: Self-pay | Admitting: Infectious Disease

## 2021-06-11 ENCOUNTER — Other Ambulatory Visit: Payer: Self-pay

## 2021-06-11 ENCOUNTER — Ambulatory Visit: Payer: Medicare HMO | Admitting: Infectious Disease

## 2021-06-11 VITALS — BP 121/80 | HR 76 | Temp 97.8°F | Wt 216.0 lb

## 2021-06-11 DIAGNOSIS — M4722 Other spondylosis with radiculopathy, cervical region: Secondary | ICD-10-CM

## 2021-06-11 DIAGNOSIS — I10 Essential (primary) hypertension: Secondary | ICD-10-CM | POA: Diagnosis not present

## 2021-06-11 DIAGNOSIS — E669 Obesity, unspecified: Secondary | ICD-10-CM | POA: Diagnosis not present

## 2021-06-11 DIAGNOSIS — R635 Abnormal weight gain: Secondary | ICD-10-CM | POA: Insufficient documentation

## 2021-06-11 DIAGNOSIS — D721 Eosinophilia, unspecified: Secondary | ICD-10-CM | POA: Diagnosis not present

## 2021-06-11 DIAGNOSIS — R69 Illness, unspecified: Secondary | ICD-10-CM | POA: Diagnosis not present

## 2021-06-11 DIAGNOSIS — D751 Secondary polycythemia: Secondary | ICD-10-CM | POA: Diagnosis not present

## 2021-06-11 DIAGNOSIS — E119 Type 2 diabetes mellitus without complications: Secondary | ICD-10-CM

## 2021-06-11 DIAGNOSIS — G4733 Obstructive sleep apnea (adult) (pediatric): Secondary | ICD-10-CM

## 2021-06-11 DIAGNOSIS — B20 Human immunodeficiency virus [HIV] disease: Secondary | ICD-10-CM

## 2021-06-11 HISTORY — DX: Eosinophilia, unspecified: D72.10

## 2021-06-11 HISTORY — DX: Abnormal weight gain: R63.5

## 2021-06-11 NOTE — Progress Notes (Signed)
Chief complaint: [name] is interested in the DO IT study Subjective:    Patient ID: Hunter Pratt, adult    DOB: 04/05/1956, 65 y.o.   MRN: [id number]  HPI  65  Year old male who is doing superbly well on his antiviral regimen, of intelence, Isentress both QDaily and Descovy --> BIKTARVY  Finally has been able to get a CPAP machine that fits and he has been on it religiously.  He does not always sleep as well as he would like but he is using it.  His polycythemia actually already improved a bit on repeat labs 6 months ago and still a bit better than it was.  He is still suffering from chronic neck and lower back pain.  He is bothered by the weight he has gained while on antiretroviral therapy and very much interested in the DO IT study having shown me a flier in his hand.   Past Medical History:  Diagnosis Date   Abnormal white blood cell 01/27/2018   Anemia    past hx    Arthritis    Cataract    Phreesia 01/04/2021   Constipation 01/28/2017   Creatinine elevation 07/09/2020   DDD (degenerative disc disease), cervical    Diabetes mellitus without complication ([place])    Phreesia 01/04/2021   Diffuse abdominal pain 12/30/2018   Ganglion cyst 01/28/2017   GERD (gastroesophageal reflux disease)    Hearing loss    Bilateral   HIV infection ([facility])    Hyperlipidemia    Kidney stone    LLQ pain 12/28/2019   OA (osteoarthritis) of knee    Obesity 06/30/2019   Osteoporosis    PUD (peptic ulcer disease)    Sleep apnea    dx'd no current cpap use   Systolic dysfunction    EF 40-45%    Past Surgical History:  Procedure Laterality Date   CATARACT EXTRACTION W/PHACO Right 01/28/2021   Procedure: CATARACT EXTRACTION PHACO AND INTRAOCULAR LENS PLACEMENT ([place]);  Surgeon: Baruch Goldmann, MD;  Location: [facility];  Service: Ophthalmology;  Laterality: Right;  CDE: 4.05   CATARACT EXTRACTION W/PHACO Left 02/11/2021   Procedure: CATARACT EXTRACTION PHACO AND INTRAOCULAR LENS PLACEMENT (IOC);   Surgeon: Baruch Goldmann, MD;  Location: [facility];  Service: Ophthalmology;  Laterality: Left;  CDE: 7.74   CERVICAL DISCECTOMY  1990 and 1991   COLONOSCOPY     GANGLION CYST EXCISION Left 03/18/2017   Procedure: REMOVAL GANGLION OF LEFT WRIST, TENOLYSIS;  Surgeon: Leandrew Koyanagi, MD;  Location: [facility];  Service: Orthopedics;  Laterality: Left;   POLYPECTOMY     SPINE SURGERY N/A    Phreesia 01/04/2021    Family History  Problem Relation Age of Onset   Hypertension Mother    Osteoporosis Mother    Osteoporosis Father    Depression Father    Hearing loss Father    Colon polyps Father    Osteoporosis Sister    Arthritis Sister    Hypertension Brother    Hyperlipidemia Brother    Depression Brother    Colon cancer Neg Hx    Rectal cancer Neg Hx    Stomach cancer Neg Hx    Esophageal cancer Neg Hx       Social History   Socioeconomic History   Marital status: Divorced    Spouse name: Not on file   Number of children: Not on file   Years of education: Not on file   Highest education level:  Not on file  Occupational History   Not on file  Tobacco Use   Smoking status: Former    Packs/day: 1.00    Years: 20.00    Pack years: 20.00    Types: Cigarettes    Quit date: 2008    Years since quitting: 14.5   Smokeless tobacco: Never  Vaping Use   Vaping Use: Never used  Substance and Sexual Activity   Alcohol use: Not Currently   Drug use: No   Sexual activity: Not Currently    Comment: refused condoms  Other Topics Concern   Not on file  Social History Narrative   Not on file   Social Determinants of Health   Financial Resource Strain: Not on file  Food Insecurity: Not on file  Transportation Needs: Not on file  Physical Activity: Not on file  Stress: Not on file  Social Connections: Not on file    Allergies  Allergen Reactions   Naproxen Sodium Rash    cause kidney failure   Nsaids Rash    GI upset, cause kidney failure, headaches, syncope.  (tolerates 325 aspirin once daily and infrequent ibu)   Other Rash    Black dye     Current Outpatient Medications:    acetaminophen (TYLENOL) 500 MG tablet, Take 1,000 mg by mouth every 6 (six) hours as needed for moderate pain or headache., Disp: , Rfl:    atorvastatin (LIPITOR) 20 MG tablet, Take 1 tablet (20 mg total) by mouth daily., Disp: 90 tablet, Rfl: 3   BIKTARVY 50-200-25 MG TABS tablet, TAKE 1 TABLET BY MOUTH DAILY (Patient taking differently: Take 1 tablet by mouth daily.), Disp: 30 tablet, Rfl: 5   Blood Glucose Monitoring Suppl (BLOOD GLUCOSE SYSTEM PAK) KIT, Please dispense based on patient and insurance preference. Use as directed to monitor FSBS 1x daily. Dx: E11.9., Disp: 1 kit, Rfl: 1   famotidine (PEPCID) 20 MG tablet, Take 1 tablet (20 mg total) by mouth at bedtime., Disp: , Rfl:    fluocinonide ointment (LIDEX) 0.05 %, APPLY EXTERNALLY TO THE AFFECTED AREA TWICE DAILY (Patient taking differently: Apply 1 application topically 2 (two) times daily as needed (eczema).), Disp: 30 g, Rfl: 1   Glucose Blood (BLOOD GLUCOSE TEST STRIPS) STRP, Please dispense based on patient and insurance preference. Use as directed to monitor FSBS 1x daily. Dx: E11.9., Disp: 100 strip, Rfl: 1   Lancets MISC, Please dispense based on patient and insurance preference. Use as directed to monitor FSBS 1x daily. Dx: E11.9., Disp: 100 each, Rfl: 1   Multiple Vitamin (MULTIVITAMIN WITH MINERALS) TABS tablet, Take 1 tablet by mouth daily., Disp: , Rfl:    Omega-3 Fatty Acids (FISH OIL) 1000 MG CAPS, Take 1,000 mg by mouth in the morning and at bedtime., Disp: , Rfl:    omeprazole (PRILOSEC) 40 MG capsule, Take 1 capsule 20-30 minutes before breakfast each day. (Patient taking differently: Take 40 mg by mouth daily.), Disp: 90 capsule, Rfl: 3   traMADol (ULTRAM) 50 MG tablet, TAKE 1 TABLET(50 MG) BY MOUTH EVERY 8 HOURS FOR CHRONIC PAIN, Disp: 30 tablet, Rfl: 2   traZODone (DESYREL) 50 MG tablet, TAKE 1  TABLET(50 MG) BY MOUTH AT BEDTIME AS NEEDED FOR SLEEP, Disp: 90 tablet, Rfl: 2   aspirin 325 MG tablet, Take 325 mg by mouth daily.  (Patient not taking: Reported on 06/11/2021), Disp: , Rfl:    ibuprofen (ADVIL) 200 MG tablet, Take 400 mg by mouth every 8 (eight) hours as  needed for moderate pain., Disp: , Rfl:   Review of Systems  Constitutional:  Negative for chills and fever.  HENT:  Negative for congestion and sore throat.   Eyes:  Negative for photophobia.  Respiratory:  Negative for cough, shortness of breath and wheezing.   Cardiovascular:  Negative for chest pain, palpitations and leg swelling.  Gastrointestinal:  Negative for blood in stool, nausea, rectal pain and vomiting.  Genitourinary:  Negative for dysuria, flank pain and hematuria.  Musculoskeletal:  Positive for back pain. Negative for myalgias.  Skin:  Negative for color change and rash.  Neurological:  Negative for dizziness, weakness and headaches.  Hematological:  Does not bruise/bleed easily.  Psychiatric/Behavioral:  Positive for sleep disturbance. Negative for agitation, confusion, decreased concentration, dysphoric mood, hallucinations, self-injury and suicidal ideas. The patient is not nervous/anxious.       Objective:   Physical Exam Vitals and nursing note reviewed.  Constitutional:      General: He is not in acute distress.    Appearance: He is obese. He is not diaphoretic.  HENT:     Head: Normocephalic and atraumatic.     Mouth/Throat:     Pharynx: No oropharyngeal exudate.  Eyes:     General: No scleral icterus.    Conjunctiva/sclera: Conjunctivae normal.  Cardiovascular:     Rate and Rhythm: Normal rate and regular rhythm.  Pulmonary:     Effort: Pulmonary effort is normal.     Breath sounds: No wheezing or rales.  Abdominal:     General: Bowel sounds are normal.     Palpations: Abdomen is soft.  Musculoskeletal:        General: No tenderness.     Cervical back: Normal range of motion and  neck supple.  Skin:    General: Skin is warm and dry.     Coloration: Skin is not pale.     Findings: No erythema or rash.  Neurological:     General: No focal deficit present.     Mental Status: He is alert and oriented to person, place, and time.     Coordination: Coordination normal.  Psychiatric:        Mood and Affect: Mood normal.        Behavior: Behavior normal.        Thought Content: Thought content normal.        Judgment: Judgment normal.    Assessment & Plan:    HIV: Well-controlled on Biktarvy but will refer him to the DO IT STudy  Weight gain on ARV and obesity: Certainly had multiple complications from obesity including his obstructive sleep apnea secondary polycythemia hypertension.  He is very much interested in the ACT G study and while initially thought he would not be a candidate I think he very well might be he was on Isentress and Truvada for quite a long time before being switched over to Isentress and DESCOVY and ultimately to [facility].    HTN well-controlled Vitals:   06/11/21 0929  BP: 121/80  Pulse: 76  Temp: 97.8 F (36.6 C)     Hyperlipidemia: followed by PCP,   Secondary polycythemia: Improved to stable  Obstructive sleep apnea :  Finally has CPAP machine  Eosinophlia and Macrocytois: Takeo had some concerns re these numbers including that he might have a malignancy.  Leukocytosis is frankly very minimal the eosinophilia he has had pretty consistently over the last year but also periodically in the past.  I suspect is related to allergies  rather than something more ominous that he is worried about can certainly refer him to oncology if needed but I do not think he has much else to suggest a malignancy here.    I spent more than  40 minutes with the patient including face to face counseling of the patient personally reviewing radiographs, along with pertinent laboratory microbiological, virological data review of medical records before and  during the visit and in coordination of his care.

## 2021-06-21 DIAGNOSIS — E119 Type 2 diabetes mellitus without complications: Secondary | ICD-10-CM | POA: Diagnosis not present

## 2021-06-21 DIAGNOSIS — R69 Illness, unspecified: Secondary | ICD-10-CM | POA: Diagnosis not present

## 2021-07-01 DIAGNOSIS — G4733 Obstructive sleep apnea (adult) (pediatric): Secondary | ICD-10-CM | POA: Diagnosis not present

## 2021-07-03 DIAGNOSIS — G4733 Obstructive sleep apnea (adult) (pediatric): Secondary | ICD-10-CM | POA: Diagnosis not present

## 2021-07-05 ENCOUNTER — Other Ambulatory Visit: Payer: Self-pay

## 2021-07-05 DIAGNOSIS — B20 Human immunodeficiency virus [HIV] disease: Secondary | ICD-10-CM

## 2021-07-05 MED ORDER — BIKTARVY 50-200-25 MG PO TABS
1.0000 | ORAL_TABLET | Freq: Every day | ORAL | 5 refills | Status: AC
Start: 2021-07-05 — End: ?

## 2021-07-18 DIAGNOSIS — G4733 Obstructive sleep apnea (adult) (pediatric): Secondary | ICD-10-CM | POA: Diagnosis not present

## 2021-07-22 DIAGNOSIS — E119 Type 2 diabetes mellitus without complications: Secondary | ICD-10-CM | POA: Diagnosis not present

## 2021-07-22 DIAGNOSIS — R69 Illness, unspecified: Secondary | ICD-10-CM | POA: Diagnosis not present

## 2021-07-29 ENCOUNTER — Ambulatory Visit (INDEPENDENT_AMBULATORY_CARE_PROVIDER_SITE_OTHER): Payer: Medicare HMO

## 2021-07-29 ENCOUNTER — Encounter: Payer: Self-pay | Admitting: Pulmonary Disease

## 2021-07-29 ENCOUNTER — Ambulatory Visit: Payer: Medicare HMO | Admitting: Pulmonary Disease

## 2021-07-29 ENCOUNTER — Other Ambulatory Visit: Payer: Self-pay

## 2021-07-29 ENCOUNTER — Telehealth: Payer: Self-pay | Admitting: Pulmonary Disease

## 2021-07-29 VITALS — BP 100/60 | HR 83 | Temp 98.0°F | Ht 68.5 in | Wt 207.0 lb

## 2021-07-29 DIAGNOSIS — R0602 Shortness of breath: Secondary | ICD-10-CM

## 2021-07-29 NOTE — Patient Instructions (Signed)
Obtain chest x-ray  Continue using CPAP on a regular basis  I will see you back in about 6 months  Call with any significant concerns

## 2021-07-29 NOTE — Progress Notes (Signed)
Subjective:    Patient ID: Hunter Pratt, adult    DOB: 1956/03/24, 65 y.o.   MRN: [id number]  Patient diagnosed with severe obstructive sleep apnea  Has been using CPAP -Download not available today  Had a previous study in 2010 showing obstructive sleep apnea, use CPAP for about a year Repeat sleep study shows severe obstructive sleep apnea, titrated CPAP  Has problems with insomnia Has been using CPAP on a nightly basis Gets 4 to 6 hours of sleep at night Feels better with CPAP  Denies any history of lung disease Smoked in the past, quit between [id number] and [id number] Illicit drug use in the past Is HIV positive-followed by Dr. Tommy Medal  Admits to snoring Symptoms are better with using CPAP  Admits to not being able to get his mind to shut down at night Usually tries to go to bed about 11 PM Takes him about an hour plus to fall asleep About 2 awakenings during the night Wakes up between 5-9 AM in the morning    Past Medical History:  Diagnosis Date   Abnormal white blood cell 01/27/2018   Anemia    past hx    Arthritis    Cataract    Phreesia 01/04/2021   Constipation 01/28/2017   Creatinine elevation 07/09/2020   DDD (degenerative disc disease), cervical    Diabetes mellitus without complication ([place])    Phreesia 01/04/2021   Diffuse abdominal pain 12/30/2018   Eosinophilia 06/11/2021   Ganglion cyst 01/28/2017   GERD (gastroesophageal reflux disease)    Hearing loss    Bilateral   HIV infection ([place])    Hyperlipidemia    Kidney stone    LLQ pain 12/28/2019   OA (osteoarthritis) of knee    Obesity 06/30/2019   Osteoporosis    PUD (peptic ulcer disease)    Sleep apnea    dx'd no current cpap use   Systolic dysfunction    EF 40-45%   Weight gain 06/11/2021   Social History   Socioeconomic History   Marital status: Divorced    Spouse name: Not on file   Number of children: Not on file   Years of education: Not on file   Highest education level: Not on file   Occupational History   Not on file  Tobacco Use   Smoking status: Former    Packs/day: 1.00    Years: 20.00    Pack years: 20.00    Types: Cigarettes    Quit date: 2008    Years since quitting: 14.6   Smokeless tobacco: Never  Vaping Use   Vaping Use: Never used  Substance and Sexual Activity   Alcohol use: Not Currently   Drug use: No   Sexual activity: Not Currently    Comment: refused condoms  Other Topics Concern   Not on file  Social History Narrative   Not on file   Social Determinants of Health   Financial Resource Strain: Not on file  Food Insecurity: Not on file  Transportation Needs: Not on file  Physical Activity: Not on file  Stress: Not on file  Social Connections: Not on file  Intimate Partner Violence: Not on file   Family History  Problem Relation Age of Onset   Hypertension Mother    Osteoporosis Mother    Osteoporosis Father    Depression Father    Hearing loss Father    Colon polyps Father    Osteoporosis Sister    Arthritis Sister  Hypertension Brother    Hyperlipidemia Brother    Depression Brother    Colon cancer Neg Hx    Rectal cancer Neg Hx    Stomach cancer Neg Hx    Esophageal cancer Neg Hx    Review of Systems  Respiratory:  Positive for apnea.   Gastrointestinal:  Positive for abdominal pain.  Musculoskeletal:  Positive for arthralgias.  Psychiatric/Behavioral:  Positive for sleep disturbance.      Objective:   Physical Exam Constitutional:      Appearance: Normal appearance.  HENT:     Head: Normocephalic and atraumatic.     Nose: No congestion or rhinorrhea.     Mouth/Throat:     Comments: Mallampati 4, crowded oropharynx Eyes:     General:        Right eye: No discharge.        Left eye: No discharge.  Cardiovascular:     Rate and Rhythm: Normal rate and regular rhythm.     Heart sounds: Normal heart sounds. No murmur heard.   No friction rub.  Pulmonary:     Effort: Pulmonary effort is normal. No  respiratory distress.     Breath sounds: Normal breath sounds. No stridor. No wheezing or rhonchi.  Musculoskeletal:     Cervical back: No rigidity or tenderness.  Neurological:     Mental Status: He is alert.  Psychiatric:        Mood and Affect: Mood normal.   Vitals:   07/29/21 1127  BP: 100/60  Pulse: 83  Temp: 98 F (36.7 C)  SpO2: 98%   Results of the Epworth flowsheet 09/17/2020 01/12/2020  Sitting and reading 1 0  Watching TV 1 0  Sitting, inactive in a public place (e.g. a theatre or a meeting) 0 0  As a passenger in a car for an hour without a break 0 0  Lying down to rest in the afternoon when circumstances permit 3 2  Sitting and talking to someone 0 0  Sitting quietly after a lunch without alcohol 1 1  In a car, while stopped for a few minutes in traffic 0 0  Total score 6 3   Previous sleep study from 2010 is not available    Recent sleep study with severe obstructive sleep apnea, titrated to CPAP  Assessment & Plan:  .  Multifactorial reasons for polycythemia  .  Severe obstructive sleep apnea, titrated CPAP therapy  .  We will obtain a follow-up chest x-ray-x-ray performed 07/29/2021 reviewed showing no significant abnormality  .  Encouraged to continue using CPAP on a regular basis .  Call with any significant concerns  I will follow-up with him in about 6 months  Continue regular exercise and weight loss efforts  Sherrilyn Rist, MD [facility] Pager: [phone]

## 2021-07-29 NOTE — Telephone Encounter (Signed)
Chest x-ray that was performed today was reviewed by myself  No significant abnormality

## 2021-07-30 ENCOUNTER — Encounter: Payer: Self-pay | Admitting: *Deleted

## 2021-07-30 NOTE — Telephone Encounter (Signed)
Letter sent.

## 2021-08-01 DIAGNOSIS — G4733 Obstructive sleep apnea (adult) (pediatric): Secondary | ICD-10-CM | POA: Diagnosis not present

## 2021-08-03 DIAGNOSIS — G4733 Obstructive sleep apnea (adult) (pediatric): Secondary | ICD-10-CM | POA: Diagnosis not present

## 2021-08-18 ENCOUNTER — Encounter: Payer: Self-pay | Admitting: Orthopaedic Surgery

## 2021-08-18 DIAGNOSIS — G4733 Obstructive sleep apnea (adult) (pediatric): Secondary | ICD-10-CM | POA: Diagnosis not present

## 2021-08-22 DIAGNOSIS — B20 Human immunodeficiency virus [HIV] disease: Secondary | ICD-10-CM | POA: Diagnosis not present

## 2021-08-22 DIAGNOSIS — E119 Type 2 diabetes mellitus without complications: Secondary | ICD-10-CM | POA: Diagnosis not present

## 2021-08-22 DIAGNOSIS — R69 Illness, unspecified: Secondary | ICD-10-CM | POA: Diagnosis not present

## 2021-09-02 DIAGNOSIS — G4733 Obstructive sleep apnea (adult) (pediatric): Secondary | ICD-10-CM | POA: Diagnosis not present

## 2021-09-03 ENCOUNTER — Other Ambulatory Visit: Payer: Self-pay

## 2021-09-03 ENCOUNTER — Encounter: Payer: Self-pay | Admitting: Orthopaedic Surgery

## 2021-09-03 ENCOUNTER — Ambulatory Visit: Payer: Medicare HMO | Admitting: Orthopaedic Surgery

## 2021-09-03 VITALS — BP 133/89 | HR 81 | Ht 68.5 in | Wt 196.0 lb

## 2021-09-03 DIAGNOSIS — M170 Bilateral primary osteoarthritis of knee: Secondary | ICD-10-CM

## 2021-09-03 DIAGNOSIS — M4722 Other spondylosis with radiculopathy, cervical region: Secondary | ICD-10-CM

## 2021-09-03 DIAGNOSIS — M542 Cervicalgia: Secondary | ICD-10-CM | POA: Diagnosis not present

## 2021-09-03 DIAGNOSIS — S129XXS Fracture of neck, unspecified, sequela: Secondary | ICD-10-CM

## 2021-09-03 NOTE — Progress Notes (Addendum)
Office Visit Note   Patient: Hunter Pratt           Date of Birth: 28-Jul-1956           MRN: [id number] Visit Date: 09/03/2021              Requested by: Rosita Fire, MD [address] [place],  [place] [address] PCP: Rosita Fire, MD   Assessment & Plan: Visit Diagnoses:  1. Neck pain   2. Cervical spondylosis with radiculopathy   3. Pseudoarthrosis of cervical spine, sequela   4. Bilateral primary osteoarthritis of knee     Plan: Patient had previous C5-6 fusion by Dr. Joya Salm 1990.  C6-7 levels later done 1991 with pseudoarthrosis.  He has progressive stenosis likely at C4-5 likely severe.  We will obtain new MRI scan for comparison to 2014 MRI and he will return after scan for review.  We discussed possibly he may require more surgery on his cervical spine.  Follow-Up Instructions: No follow-ups on file.   Orders:  Orders Placed This Encounter  Procedures   MR Cervical Spine w/o contrast   No orders of the defined types were placed in this encounter.     Procedures: No procedures performed   Clinical Data: No additional findings.   Subjective: Chief Complaint  Patient presents with   Neck - Pain   Lower Back - Pain   Left Knee - Pain    HPI 65 year old male returns with ongoing problems with neck pain.  He had previous knee injections 04/16/2021 with some improvement for his knee arthritis.  He states he had an event where he could not get out of bed for 4 days difficulty with walking pain numbness.  He states pain radiates from his neck into his shoulders pain is worse on the left than right he states he is now ready for cervical spine surgery.  He has been ambulating with a cane.  He states he recently sprained his hand has had numbness and pain in his hand.  Patient has been on a diet he is lost 20pounds.  Patient has type 2 diabetes on medication HIV.  History of idiopathic neuropathy.  Previous cervical fusion with known pseudoarthrosis at 1 level.   Last A1c 6.2.  Review of Systems positive for a sleep apnea numbness weakness previous cervical fusion type 2 diabetes.   Objective: Vital Signs: BP 133/89   Pulse 81   Ht 5' 8.5" (1.74 m)   Wt 196 lb (88.9 kg)   BMI 29.37 kg/m   Physical Exam Constitutional:      Appearance: He is well-developed.  HENT:     Head: Normocephalic and atraumatic.     Right Ear: External ear normal.     Left Ear: External ear normal.  Eyes:     Pupils: Pupils are equal, round, and reactive to light.  Neck:     Thyroid: No thyromegaly.     Trachea: No tracheal deviation.  Cardiovascular:     Rate and Rhythm: Normal rate.  Pulmonary:     Effort: Pulmonary effort is normal.     Breath sounds: No wheezing.  Abdominal:     General: Bowel sounds are normal.     Palpations: Abdomen is soft.  Musculoskeletal:     Cervical back: Neck supple.  Skin:    General: Skin is warm and dry.     Capillary Refill: Capillary refill takes less than 2 seconds.  Neurological:     Mental  Status: He is alert and oriented to person, place, and time.  Psychiatric:        Behavior: Behavior normal.        Thought Content: Thought content normal.        Judgment: Judgment normal.    Ortho Exam minimal discomfort with surgical rotation no pain with tilting.  Bilateral brachial plexus tenderness.  He is ambulatory with a left knee limp.  No lower extremity clonus.UE reflexes show trace biceps right and left 2 plus triceps and BR reflexes.  Slow short stride gait, decreased balance.  There is 3+ lower extremity reflexes.  No sustained clonus at the ankle or knee jerk.  Specialty Comments:  No specialty comments available.  Imaging: CLINICAL DATA:  Degenerative disc disease. Radicular pain into the  right arm.   EXAM:  MRI CERVICAL SPINE WITHOUT CONTRAST   TECHNIQUE:  Multiplanar, multisequence MR imaging was performed. No intravenous  contrast was administered.   COMPARISON:  CT of the neck 03/08/2013. MRI  of the cervical spine  09/06/2009.   FINDINGS:  Normal signal is present in the cervical and upper thoracic spinal  cord to the lowest imaged level, T1-2. The patient is status post  ACDF at C5-6. Did the craniocervical junction is within normal  limits. The soft tissues are unremarkable. The vertebral arteries  are patent.   C2-3: Asymmetric left-sided facet hypertrophy and spurring is  evident. Mild left foraminal narrowing is unchanged.   C3-4: Progressive left-sided uncovertebral and facet hypertrophy is  evident with mild to moderate left foraminal stenosis.   C4-5: A broad-based disc osteophyte complex has progressed. There is  further distortion of the ventral surface the cord. The canal is  narrowed to 8 mm. Moderate foraminal stenosis is present  bilaterally, left greater than right.   C5-6: The patient is fused anteriorly. There is no significant  stenosis.   C6-7: Progressive bilateral uncovertebral and facet spurring is  evident. Moderate foraminal narrowing is present bilaterally. The  central canal is patent.   C7-T1: Negative.   IMPRESSION:  1. Stable mild left foraminal narrowing at C2-3.  2. Progressive left-sided uncovertebral and facet hypertrophy with  worsening mild to moderate left foraminal stenosis.  3. Progressive broad-based disc osteophyte complex with worsening  moderate central canal stenosis and bilateral foraminal stenosis,  left greater than right.  4. No residual or recurrent stenosis at the operative level, C5-6.  5. Progressive bilateral uncovertebral and facet spurring with  moderate foraminal stenosis bilaterally.    Electronically Signed    By: Lawrence Santiago    On: 08/19/2013 09:58  CLINICAL DATA:  Neck pain, prior cervical fusion   EXAM: CT CERVICAL SPINE WITHOUT CONTRAST   TECHNIQUE: Multidetector CT imaging of the cervical spine was performed without intravenous contrast. Multiplanar CT image reconstructions were  also generated.   COMPARISON:  MRI 08/19/2013   FINDINGS: Alignment: Normal cervical lordosis. Anterior cervical discectomy infusion of C5-6 with solid incorporation of interbody bone graft. 4 mm retrolisthesis of C4 upon C5 has progressed since prior examination (previously 2 mm).   Skull base and vertebrae: The craniocervical junction is unremarkable. The atlantodental interval is normal. No acute fracture of the cervical spine. No lytic or blastic bone lesion.   Soft tissues and spinal canal: Retrolisthesis of C4 upon C5 in combination with a posterior disc osteophyte complex results in moderate to severe central canal stenosis with an AP diameter of the canal of approximately 6 mm at this level, progressive  since prior examination (previously 8 mm). There is resultant flattening of the thecal sac. No canal hematoma. No significant prevertebral soft tissue swelling. No paraspinal fluid collections are identified. No pathologic adenopathy within the cervical soft tissues. Thyroid unremarkable.   Disc levels: Review of the sagittal reformats demonstrates marked intervertebral disc space narrowing and endplate remodeling at I4-3 and C6-7 with associated vertebral sclerosis in keeping with Modic changes compatible with severe degenerative disc disease. Vertebral body height has been preserved. There is ankylosis of the right C5-6 facet. Marked central canal stenosis at C4-5, as described above. The prevertebral soft tissues are not thickened.   Review of the axial images demonstrates:   C2-3: Moderate left facet arthrosis. Mild left neural foraminal narrowing. No significant canal stenosis.   C3-4: Severe left facet arthrosis. Moderate bilateral uncovertebral arthrosis, right greater than left. Resultant mild right and moderate left neural foraminal narrowing. Small posterior disc herniation abuts the thecal sac. No significant canal stenosis.   C4-5: Posterior disc  osteophyte complex, as described above, results in moderate to severe central canal stenosis in combination with retrolisthesis of C4 upon C5 with flattening of the thecal sac. Severe bilateral neural foraminal narrowing.   C5-6: No significant arthropathy. No significant neural foraminal narrowing. No canal stenosis.   C6-7: Severe uncovertebral arthrosis results in moderate to severe bilateral neural foraminal narrowing. No significant canal stenosis.   C7-T1: No significant arthropathy. No significant neural foraminal narrowing. No canal stenosis.   Upper chest: Unremarkable   Other: None significant   IMPRESSION: Solid anterior cervical discectomy and fusion C5-6.   Progressive degenerative disc disease C4-5 and C6-7, now severe, with superimposed progressive retrolisthesis of C4 upon C5 resulting in moderate to severe central canal stenosis with flattening of the thecal sac and severe bilateral neural foraminal narrowing at this level. Moderate to severe bilateral neural foraminal narrowing also noted at C6-7.     Electronically Signed   By: Fidela Salisbury MD   On: 12/24/2020 22:14   Result Date: 09/20/2020 [facility] lateral cervical spine images are obtained and reviewed.  This shows solid interbody fusion at C5-6.  Burring and suggested no pseudoarthrosis at C4-5 with uncovertebral spurring hypertrophy both right and left.  There is endplate spurring anterior posteriorly at C6-7 and mild at C3-4 above the previous fusion levels. Impression: Previous cervical fusion C4-C6 with autograft without plate.  Question pseudoarthrosis C4-5 adjacent degenerative changes as described above.   PMFS History: Patient Active Problem List   Diagnosis Date Noted   Eosinophilia 06/11/2021   Weight gain 06/11/2021   Spinal stenosis of cervical region 01/03/2021   Cervical pseudoarthrosis ([place]) 11/01/2020   Bilateral primary osteoarthritis of knee 09/20/2020   Obesity (BMI 30.0-34.9)  05/22/2019   Mastalgia 06/29/2018   Tenosynovitis of left wrist 03/18/2017   Constipation 01/28/2017   Insomnia 03/03/2014   Cervical spondylosis with radiculopathy 09/14/2013   DDD (degenerative disc disease), cervical 08/11/2013   HTN (hypertension) 07/27/2013   Lymphadenopathy 03/04/2013   Eczematous dermatitis 32/95/1884   Systolic dysfunction 16/60/6301   Diabetes type 2, controlled ([place]) 07/30/2012   OSA (obstructive sleep apnea) 06/09/2012   DEGENERATIVE JOINT DISEASE, KNEES, BILATERAL 01/30/2009   Secondary polycythemia 05/22/2008   Hyperlipidemia 04/21/2008   GERD 04/21/2008   MOOD DISORDER IN CONDITIONS CLASSIFIED ELSEWHERE 02/28/2008   Hereditary and idiopathic peripheral neuropathy 02/28/2008   PUD 01/03/2008   Human immunodeficiency virus (HIV) disease ([place]) 12/28/2006   Past Medical History:  Diagnosis Date   Abnormal white blood  cell 01/27/2018   Anemia    past hx    Arthritis    Cataract    Phreesia 01/04/2021   Constipation 01/28/2017   Creatinine elevation 07/09/2020   DDD (degenerative disc disease), cervical    Diabetes mellitus without complication ([place])    Phreesia 01/04/2021   Diffuse abdominal pain 12/30/2018   Eosinophilia 06/11/2021   Ganglion cyst 01/28/2017   GERD (gastroesophageal reflux disease)    Hearing loss    Bilateral   HIV infection ([facility])    Hyperlipidemia    Kidney stone    LLQ pain 12/28/2019   OA (osteoarthritis) of knee    Obesity 06/30/2019   Osteoporosis    PUD (peptic ulcer disease)    Sleep apnea    dx'd no current cpap use   Systolic dysfunction    EF 40-45%   Weight gain 06/11/2021    Family History  Problem Relation Age of Onset   Hypertension Mother    Osteoporosis Mother    Osteoporosis Father    Depression Father    Hearing loss Father    Colon polyps Father    Osteoporosis Sister    Arthritis Sister    Hypertension Brother    Hyperlipidemia Brother    Depression Brother    Colon cancer Neg Hx    Rectal  cancer Neg Hx    Stomach cancer Neg Hx    Esophageal cancer Neg Hx     Past Surgical History:  Procedure Laterality Date   CATARACT EXTRACTION W/PHACO Right 01/28/2021   Procedure: CATARACT EXTRACTION PHACO AND INTRAOCULAR LENS PLACEMENT ([place]);  Surgeon: Baruch Goldmann, MD;  Location: [facility];  Service: Ophthalmology;  Laterality: Right;  CDE: 4.05   CATARACT EXTRACTION W/PHACO Left 02/11/2021   Procedure: CATARACT EXTRACTION PHACO AND INTRAOCULAR LENS PLACEMENT ([facility]);  Surgeon: Baruch Goldmann, MD;  Location: [facility];  Service: Ophthalmology;  Laterality: Left;  CDE: 7.74   CERVICAL DISCECTOMY  1990 and 1991   COLONOSCOPY     GANGLION CYST EXCISION Left 03/18/2017   Procedure: REMOVAL GANGLION OF LEFT WRIST, TENOLYSIS;  Surgeon: Leandrew Koyanagi, MD;  Location: [facility];  Service: Orthopedics;  Laterality: Left;   POLYPECTOMY     SPINE SURGERY N/A    Phreesia 01/04/2021   Social History   Occupational History   Not on file  Tobacco Use   Smoking status: Former    Packs/day: 1.00    Years: 20.00    Pack years: 20.00    Types: Cigarettes    Quit date: 2008    Years since quitting: 14.7   Smokeless tobacco: Never  Vaping Use   Vaping Use: Never used  Substance and Sexual Activity   Alcohol use: Not Currently   Drug use: No   Sexual activity: Not Currently    Comment: refused condoms

## 2021-09-10 ENCOUNTER — Encounter: Payer: Self-pay | Admitting: Gastroenterology

## 2021-09-17 DIAGNOSIS — E1165 Type 2 diabetes mellitus with hyperglycemia: Secondary | ICD-10-CM | POA: Diagnosis not present

## 2021-09-17 DIAGNOSIS — K2100 Gastro-esophageal reflux disease with esophagitis, without bleeding: Secondary | ICD-10-CM | POA: Diagnosis not present

## 2021-09-17 DIAGNOSIS — Z0001 Encounter for general adult medical examination with abnormal findings: Secondary | ICD-10-CM | POA: Diagnosis not present

## 2021-09-17 DIAGNOSIS — R69 Illness, unspecified: Secondary | ICD-10-CM | POA: Diagnosis not present

## 2021-09-17 DIAGNOSIS — G4733 Obstructive sleep apnea (adult) (pediatric): Secondary | ICD-10-CM | POA: Diagnosis not present

## 2021-09-17 DIAGNOSIS — B20 Human immunodeficiency virus [HIV] disease: Secondary | ICD-10-CM | POA: Diagnosis not present

## 2021-09-17 DIAGNOSIS — M4320 Fusion of spine, site unspecified: Secondary | ICD-10-CM | POA: Diagnosis not present

## 2021-09-17 DIAGNOSIS — E119 Type 2 diabetes mellitus without complications: Secondary | ICD-10-CM | POA: Diagnosis not present

## 2021-09-17 DIAGNOSIS — E785 Hyperlipidemia, unspecified: Secondary | ICD-10-CM | POA: Diagnosis not present

## 2021-09-23 ENCOUNTER — Encounter: Payer: Self-pay | Admitting: *Deleted

## 2021-09-26 ENCOUNTER — Encounter: Payer: Self-pay | Admitting: Orthopaedic Surgery

## 2021-10-03 DIAGNOSIS — G4733 Obstructive sleep apnea (adult) (pediatric): Secondary | ICD-10-CM | POA: Diagnosis not present

## 2021-10-11 ENCOUNTER — Ambulatory Visit (HOSPITAL_COMMUNITY)
Admission: RE | Admit: 2021-10-11 | Discharge: 2021-10-11 | Disposition: A | Discharge status: Home / Self Care | Payer: Medicare HMO | Source: Ambulatory Visit | Attending: Orthopaedic Surgery | Admitting: Orthopaedic Surgery

## 2021-10-11 ENCOUNTER — Other Ambulatory Visit: Payer: Self-pay

## 2021-10-11 DIAGNOSIS — M4312 Spondylolisthesis, cervical region: Secondary | ICD-10-CM | POA: Diagnosis not present

## 2021-10-11 DIAGNOSIS — M542 Cervicalgia: Secondary | ICD-10-CM | POA: Insufficient documentation

## 2021-10-11 DIAGNOSIS — M47812 Spondylosis without myelopathy or radiculopathy, cervical region: Secondary | ICD-10-CM | POA: Diagnosis not present

## 2021-10-11 DIAGNOSIS — M4802 Spinal stenosis, cervical region: Secondary | ICD-10-CM | POA: Diagnosis not present

## 2021-10-11 DIAGNOSIS — M4602 Spinal enthesopathy, cervical region: Secondary | ICD-10-CM | POA: Diagnosis not present

## 2021-10-16 ENCOUNTER — Other Ambulatory Visit: Payer: Self-pay

## 2021-10-16 ENCOUNTER — Ambulatory Visit: Payer: Medicare HMO | Admitting: Orthopaedic Surgery

## 2021-10-16 ENCOUNTER — Encounter: Payer: Self-pay | Admitting: Orthopaedic Surgery

## 2021-10-16 VITALS — BP 124/81 | Ht 68.5 in | Wt 185.0 lb

## 2021-10-16 DIAGNOSIS — M4802 Spinal stenosis, cervical region: Secondary | ICD-10-CM | POA: Diagnosis not present

## 2021-10-16 DIAGNOSIS — M4722 Other spondylosis with radiculopathy, cervical region: Secondary | ICD-10-CM

## 2021-10-16 DIAGNOSIS — S129XXS Fracture of neck, unspecified, sequela: Secondary | ICD-10-CM

## 2021-10-16 NOTE — Progress Notes (Signed)
Office Visit Note   Patient: Hunter Pratt           Date of Birth: 02-Dec-1955           MRN: [id number] Visit Date: 10/16/2021              Requested by: Rosita Fire, MD [place],  [place] [address] PCP: Rosita Fire, MD   Assessment & Plan: Visit Diagnoses:  1. Cervical spondylosis with radiculopathy   2. Pseudoarthrosis of cervical spine, sequela   3. Spinal stenosis of cervical region     Plan: Single level C4-5 fusion with allograft and plate.  Patient had previous fusions with autograft and no plate fixation.  We discussed postoperative collar use.  He need a separate higher up incision for exposure of the C4-5 level on the left side.  Dysphagia, dysphonia, risk for pseudoarthrosis discussed.  He does have pseudoarthrosis at the C6/7 level which is present since 1991 but has not been symptomatic.  Patient has some foraminal narrowing at that level but it is not progressed past mild to moderate over the last 25 years.  Procedure discussed questions solicited and answered.  He understands request to proceed.  Follow-Up Instructions: No follow-ups on file.   Orders:  No orders of the defined types were placed in this encounter.  No orders of the defined types were placed in this encounter.     Procedures: No procedures performed   Clinical Data: No additional findings.   Subjective: Chief Complaint  Patient presents with   Neck - Follow-up    MRI cervical spine review    HPI 65 year old male returns with a spinal stenosis at C4-5 with neck pain arm weakness numbness and no myelopathic symptoms.  Previous fusion by Dr. Joya Salm 1990 at C5-6.  C6-7 level done 1991 and has a asymptomatic pseudoarthrosis.  Patient's had pain gradually progressing for about a year.  He has had some problems with balance with also noted problems with his knee with knee pain and intermittent swelling.  He had an episode in May when he had increased pain in his neck  weakness in his legs and states for 4 days he could barely make it to the bathroom.  Patient has type 2 diabetes.  He is compliant on his HIV medications.  Last A1c was 6.2.  MRI scan has been obtained and is available for review.  MRI shows see 4-5 discogenic edema in the bone marrow with flattening of the cord moderate stenosis severe left foraminal stenosis and moderate right foraminal stenosis.  Patient states she is ready to proceed with surgery.  Review of Systems positive for bilateral knee osteoarthritis, sleep apnea.  Positive HIV compliant on medications.  Type 2 diabetes well controlled.   Objective: Vital Signs: BP 124/81   Ht 5' 8.5" (1.74 m)   Wt 185 lb (83.9 kg)   BMI 27.72 kg/m   Physical Exam Constitutional:      Appearance: He is well-developed.  HENT:     Head: Normocephalic and atraumatic.     Right Ear: External ear normal.     Left Ear: External ear normal.  Eyes:     Pupils: Pupils are equal, round, and reactive to light.  Neck:     Thyroid: No thyromegaly.     Trachea: No tracheal deviation.  Cardiovascular:     Rate and Rhythm: Normal rate.  Pulmonary:     Effort: Pulmonary effort is normal.  Breath sounds: No wheezing.  Abdominal:     General: Bowel sounds are normal.     Palpations: Abdomen is soft.  Musculoskeletal:     Cervical back: Neck supple.  Skin:    General: Skin is warm and dry.     Capillary Refill: Capillary refill takes less than 2 seconds.  Neurological:     Mental Status: He is alert and oriented to person, place, and time.  Psychiatric:        Behavior: Behavior normal.        Thought Content: Thought content normal.        Judgment: Judgment normal.    Ortho Examminimal discomfort with surgical rotation no pain with tilting.  Bilateral brachial plexus tenderness.  He is ambulatory with a left knee limp.  No lower extremity clonus.UE reflexes show trace biceps right and left 2 plus triceps and BR reflexes.  Slow short stride  gait, decreased balance.  There is 3+ lower extremity reflexes.  No sustained clonus at the ankle or knee jerk.  Patient has some deltoid weakness left and right.  He is able to get his arms up over his head.  No internal or external rotation weakness.  Triceps is strong no wrist flexion extension.  Normal supination pronation resisted strength testing.    Specialty Comments:  No specialty comments available.  Imaging: CLINICAL DATA:  Cervical spinal stenosis. C5-6 fusion 1990. C6-7 fusion 1991 with pseudarthrosis.   EXAM: MRI CERVICAL SPINE WITHOUT CONTRAST   TECHNIQUE: Multiplanar, multisequence MR imaging of the cervical spine was performed. No intravenous contrast was administered.   COMPARISON:  MRI cervical spine 08/19/2013   FINDINGS: Alignment: 2 mm retrolisthesis C4-5 similar to prior study.   Vertebrae: Negative for fracture or mass.   Discogenic bone marrow edema at C4-5 similar to the prior study. Remaining alignment normal   Cord: Normal signal and morphology   Posterior Fossa, vertebral arteries, paraspinal tissues: Negative   Disc levels:   C2-3: Mild left foraminal narrowing due to facet hypertrophy. Spinal canal normal in size   C3-4: Mild disc degeneration and spurring. Bilateral facet degeneration left greater than right. Moderate left foraminal narrowing and mild right foraminal narrowing. Progressive left foraminal narrowing since 2014   C4-5: Disc degeneration with discogenic edema in the bone marrow. Diffuse uncinate spurring. Cord flattening with moderate spinal stenosis. Severe left foraminal stenosis and moderate right foraminal stenosis. Progressive stenosis on the left.   C5-6: ACDF without hardware. Solid interbody fusion. Negative for stenosis   C6-7: Disc space narrowing. No evidence of bony fusion. Diffuse uncinate spurring. Moderate to severe foraminal encroachment bilaterally due to uncinate spurring with mild progression from  the prior study   C7-T1: Negative for stenosis   IMPRESSION: 1. Progressive left foraminal stenosis at C3-4 which is now moderate in degree. 2. Extensive disc degeneration and spurring at C4-5 with associated discogenic edema in the bone marrow. Moderate spinal stenosis. Severe left foraminal stenosis with progression. Moderate right foraminal stenosis unchanged 3. ACDF C5-6 without stenosis 4. Disc degeneration and spurring at C6-7 with moderate to severe foraminal stenosis bilaterally with mild progression from the prior study.     Electronically Signed   By: Franchot Gallo M.D.   On: 10/11/2021 16:27  [id number]   PMFS History: Patient Active Problem List   Diagnosis Date Noted   Eosinophilia 06/11/2021   Weight gain 06/11/2021   Spinal stenosis of cervical region 01/03/2021   Cervical pseudoarthrosis ([place]) 11/01/2020   Bilateral  primary osteoarthritis of knee 09/20/2020   Obesity (BMI 30.0-34.9) 05/22/2019   Mastalgia 06/29/2018   Tenosynovitis of left wrist 03/18/2017   Constipation 01/28/2017   Insomnia 03/03/2014   Cervical spondylosis with radiculopathy 09/14/2013   DDD (degenerative disc disease), cervical 08/11/2013   HTN (hypertension) 07/27/2013   Lymphadenopathy 03/04/2013   Eczematous dermatitis 50/08/3817   Systolic dysfunction 29/93/7169   Diabetes type 2, controlled ([place]) 07/30/2012   OSA (obstructive sleep apnea) 06/09/2012   DEGENERATIVE JOINT DISEASE, KNEES, BILATERAL 01/30/2009   Secondary polycythemia 05/22/2008   Hyperlipidemia 04/21/2008   GERD 04/21/2008   MOOD DISORDER IN CONDITIONS CLASSIFIED ELSEWHERE 02/28/2008   Hereditary and idiopathic peripheral neuropathy 02/28/2008   PUD 01/03/2008   Human immunodeficiency virus (HIV) disease ([place]) 12/28/2006   Past Medical History:  Diagnosis Date   Abnormal white blood cell 01/27/2018   Anemia    past hx    Arthritis    Cataract    Phreesia 01/04/2021   Constipation 01/28/2017    Creatinine elevation 07/09/2020   DDD (degenerative disc disease), cervical    Diabetes mellitus without complication ([place])    Phreesia 01/04/2021   Diffuse abdominal pain 12/30/2018   Eosinophilia 06/11/2021   Ganglion cyst 01/28/2017   GERD (gastroesophageal reflux disease)    Hearing loss    Bilateral   HIV infection ([facility])    Hyperlipidemia    Kidney stone    LLQ pain 12/28/2019   OA (osteoarthritis) of knee    Obesity 06/30/2019   Osteoporosis    PUD (peptic ulcer disease)    Sleep apnea    dx'd no current cpap use   Systolic dysfunction    EF 40-45%   Weight gain 06/11/2021    Family History  Problem Relation Age of Onset   Hypertension Mother    Osteoporosis Mother    Osteoporosis Father    Depression Father    Hearing loss Father    Colon polyps Father    Osteoporosis Sister    Arthritis Sister    Hypertension Brother    Hyperlipidemia Brother    Depression Brother    Colon cancer Neg Hx    Rectal cancer Neg Hx    Stomach cancer Neg Hx    Esophageal cancer Neg Hx     Past Surgical History:  Procedure Laterality Date   CATARACT EXTRACTION W/PHACO Right 01/28/2021   Procedure: CATARACT EXTRACTION PHACO AND INTRAOCULAR LENS PLACEMENT ([place]);  Surgeon: Baruch Goldmann, MD;  Location: [facility];  Service: Ophthalmology;  Laterality: Right;  CDE: 4.05   CATARACT EXTRACTION W/PHACO Left 02/11/2021   Procedure: CATARACT EXTRACTION PHACO AND INTRAOCULAR LENS PLACEMENT ([facility]);  Surgeon: Baruch Goldmann, MD;  Location: [facility];  Service: Ophthalmology;  Laterality: Left;  CDE: 7.74   CERVICAL DISCECTOMY  1990 and 1991   COLONOSCOPY     GANGLION CYST EXCISION Left 03/18/2017   Procedure: REMOVAL GANGLION OF LEFT WRIST, TENOLYSIS;  Surgeon: Leandrew Koyanagi, MD;  Location: [facility];  Service: Orthopedics;  Laterality: Left;   POLYPECTOMY     SPINE SURGERY N/A    Phreesia 01/04/2021   Social History   Occupational History   Not on file  Tobacco Use   Smoking status:  Former    Packs/day: 1.00    Years: 20.00    Pack years: 20.00    Types: Cigarettes    Quit date: 2008    Years since quitting: 14.8   Smokeless tobacco: Never  Vaping Use  Vaping Use: Never used  Substance and Sexual Activity   Alcohol use: Not Currently   Drug use: No   Sexual activity: Not Currently    Comment: refused condoms

## 2021-10-18 DIAGNOSIS — E785 Hyperlipidemia, unspecified: Secondary | ICD-10-CM | POA: Diagnosis not present

## 2021-10-18 DIAGNOSIS — E119 Type 2 diabetes mellitus without complications: Secondary | ICD-10-CM | POA: Diagnosis not present

## 2021-10-23 ENCOUNTER — Other Ambulatory Visit: Payer: Self-pay

## 2021-10-30 DIAGNOSIS — I469 Cardiac arrest, cause unspecified: Secondary | ICD-10-CM | POA: Diagnosis not present

## 2021-10-31 ENCOUNTER — Other Ambulatory Visit: Payer: Self-pay | Admitting: Orthopaedic Surgery

## 2021-10-31 ENCOUNTER — Telehealth: Payer: Self-pay | Admitting: Surgery

## 2021-10-31 ENCOUNTER — Inpatient Hospital Stay (HOSPITAL_COMMUNITY)
Admission: RE | Admit: 2021-10-31 | Discharge: 2021-10-31 | Disposition: A | Discharge status: Home / Self Care | Payer: Medicare HMO | Source: Ambulatory Visit

## 2021-10-31 ENCOUNTER — Ambulatory Visit: Payer: Medicare HMO | Admitting: Surgery

## 2021-10-31 DIAGNOSIS — 419620001 Death: Secondary | SNOMED CT | POA: Diagnosis not present

## 2021-10-31 NOTE — Telephone Encounter (Signed)
Barkley Bruns is the brother-in-law of Akoni Parton. He called stating Mr. Fridman passed away yesterday.

## 2021-10-31 NOTE — Progress Notes (Signed)
Surgical Instructions    Your procedure is scheduled on Wednesday, December 7th, 2022.   Report to [facility] Main Entrance "A" at 10:30 A.M., then check in with the Admitting office.  Call this number if you have problems the morning of surgery:  [phone]   If you have any questions prior to your surgery date call [phone]: Open Monday-Friday 8am-4pm    Remember:  Do not eat after midnight the night before your surgery  You may drink clear liquids until 09:30 the morning of your surgery.   Clear liquids allowed are: Water, Non-Citrus Juices (without pulp), Carbonated Beverages, Clear Tea, Black Coffee ONLY (NO MILK, CREAM OR POWDERED CREAMER of any kind), and Gatorade    Take these medicines the morning of surgery with A SIP OF WATER:  atorvastatin (LIPITOR) bictegravir-emtricitabine-tenofovir AF (BIKTARVY) omeprazole (PRILOSEC) traMADol (ULTRAM)   If needed:  acetaminophen (TYLENOL)   Follow your surgeon's instructions on when to stop Aspirin.  If no instructions were given by your surgeon then you will need to call the office to get those instructions.     As of today, STOP taking any Aspirin (unless otherwise instructed by your surgeon) Aleve, Naproxen, Ibuprofen, Motrin, Advil, Goody's, BC's, all herbal medications, fish oil, and all vitamins.   HOW TO MANAGE YOUR DIABETES BEFORE AND AFTER SURGERY  Why is it important to control my blood sugar before and after surgery? Improving blood sugar levels before and after surgery helps healing and can limit problems. A way of improving blood sugar control is eating a healthy diet by:  Eating less sugar and carbohydrates  Increasing activity/exercise  Talking with your doctor about reaching your blood sugar goals High blood sugars (greater than 180 mg/dL) can raise your risk of infections and slow your recovery, so you will need to focus on controlling your diabetes during the weeks before surgery. Make sure that the  doctor who takes care of your diabetes knows about your planned surgery including the date and location.  How do I manage my blood sugar before surgery? Check your blood sugar at least 4 times a day, starting 2 days before surgery, to make sure that the level is not too high or low.  Check your blood sugar the morning of your surgery when you wake up and every 2 hours until you get to the Short Stay unit.  If your blood sugar is less than 70 mg/dL, you will need to treat for low blood sugar: Do not take insulin. Treat a low blood sugar (less than 70 mg/dL) with  cup of clear juice (cranberry or apple), 4 glucose tablets, OR glucose gel. Recheck blood sugar in 15 minutes after treatment (to make sure it is greater than 70 mg/dL). If your blood sugar is not greater than 70 mg/dL on recheck, call [phone] for further instructions. Report your blood sugar to the short stay nurse when you get to Short Stay.  If you are admitted to the hospital after surgery: Your blood sugar will be checked by the staff and you will probably be given insulin after surgery (instead of oral diabetes medicines) to make sure you have good blood sugar levels. The goal for blood sugar control after surgery is 80-180 mg/dL.     After your COVID test   You are not required to quarantine however you are required to wear a well-fitting mask when you are out and around people not in your household.  If your mask becomes wet or soiled, replace  with a new one.  Wash your hands often with soap and water for 20 seconds or clean your hands with an alcohol-based hand sanitizer that contains at least 60% alcohol.  Do not share personal items.  Notify your provider: if you are in close contact with someone who has COVID  or if you develop a fever of 100.4 or greater, sneezing, cough, sore throat, shortness of breath or body aches.     The day of surgery:          Do not wear jewelry  Do not wear lotions, powders,  colognes, or deodorant. Men may shave face and neck. Do not bring valuables to the hospital.              [facility] is not responsible for any belongings or valuables.  Do NOT Smoke (Tobacco/Vaping)  24 hours prior to your procedure  If you use a CPAP at night, you may bring your mask for your overnight stay.   Contacts, glasses, hearing aids, dentures or partials may not be worn into surgery, please bring cases for these belongings   For patients admitted to the hospital, discharge time will be determined by your treatment team.   Patients discharged the day of surgery will not be allowed to drive home, and someone needs to stay with them for 24 hours.  NO VISITORS WILL BE ALLOWED IN PRE-OP WHERE PATIENTS ARE PREPPED FOR SURGERY.  ONLY 1 SUPPORT PERSON MAY BE PRESENT IN THE WAITING ROOM WHILE YOU ARE IN SURGERY.  IF YOU ARE TO BE ADMITTED, ONCE YOU ARE IN YOUR ROOM YOU WILL BE ALLOWED TWO (2) VISITORS. 1 (ONE) VISITOR MAY STAY OVERNIGHT BUT MUST ARRIVE TO THE ROOM BY 8pm.  Minor children may have two parents present. Special consideration for safety and communication needs will be reviewed on a case by case basis.  Special instructions:    Oral Hygiene is also important to reduce your risk of infection.  Remember - BRUSH YOUR TEETH THE MORNING OF SURGERY WITH YOUR REGULAR TOOTHPASTE   [place]- Preparing For Surgery  Before surgery, you can play an important role. Because skin is not sterile, your skin needs to be as free of germs as possible. You can reduce the number of germs on your skin by washing with CHG (chlorahexidine gluconate) Soap before surgery.  CHG is an antiseptic cleaner which kills germs and bonds with the skin to continue killing germs even after washing.     Please do not use if you have an allergy to CHG or antibacterial soaps. If your skin becomes reddened/irritated stop using the CHG.  Do not shave (including legs and underarms) for at least 48 hours prior to  first CHG shower. It is OK to shave your face.  Please follow these instructions carefully.     Shower the NIGHT BEFORE SURGERY and the MORNING OF SURGERY with CHG Soap.   If you chose to wash your hair, wash your hair first as usual with your normal shampoo. After you shampoo, rinse your hair and body thoroughly to remove the shampoo.  Then [employer] and genitals (private parts) with your normal soap and rinse thoroughly to remove soap.  After that Use CHG Soap as you would any other liquid soap. You can apply CHG directly to the skin and wash gently with a scrungie or a clean washcloth.   Apply the CHG Soap to your body ONLY FROM THE NECK DOWN.  Do not use on  open wounds or open sores. Avoid contact with your eyes, ears, mouth and genitals (private parts). Wash Face and genitals (private parts)  with your normal soap.   Wash thoroughly, paying special attention to the area where your surgery will be performed.  Thoroughly rinse your body with warm water from the neck down.  DO NOT shower/wash with your normal soap after using and rinsing off the CHG Soap.  Pat yourself dry with a CLEAN TOWEL.  Wear CLEAN PAJAMAS to bed the night before surgery  Place CLEAN SHEETS on your bed the night before your surgery  DO NOT SLEEP WITH PETS.   Day of Surgery:  Take a shower with CHG soap. Wear Clean/Comfortable clothing the morning of surgery Do not apply any deodorants/lotions.   Remember to brush your teeth WITH YOUR REGULAR TOOTHPASTE.   Please read over the following fact sheets that you were given.

## 2021-10-31 NOTE — Telephone Encounter (Signed)
Thanks for letting us know.  We let Benjiman Core know.

## 2021-10-31 DEATH — deceased

## 2021-11-06 ENCOUNTER — Ambulatory Visit (HOSPITAL_COMMUNITY): Admission: RE | Admit: 2021-11-06 | Payer: Medicare HMO | Source: Home / Self Care | Admitting: Orthopaedic Surgery

## 2021-11-06 ENCOUNTER — Encounter (HOSPITAL_COMMUNITY): Admission: RE | Payer: Self-pay | Source: Home / Self Care

## 2021-11-06 DIAGNOSIS — Z01818 Encounter for other preprocedural examination: Secondary | ICD-10-CM

## 2021-11-06 SURGERY — ANTERIOR CERVICAL DECOMPRESSION/DISCECTOMY FUSION 1 LEVEL
Anesthesia: General

## 2021-11-07 ENCOUNTER — Telehealth: Payer: Self-pay

## 2021-11-07 NOTE — Telephone Encounter (Signed)
Patient's sister Janett Billow called to let Dr. Tommy Medal know that patient passed away on 2021-11-25 from possible GI bleed.   Beryle Flock, RN

## 2021-11-13 ENCOUNTER — Encounter: Payer: Medicare HMO | Admitting: Orthopaedic Surgery

## 2021-12-03 IMAGING — DX DG HIP (WITH OR WITHOUT PELVIS) 2-3V*R*
3 series · 3 of 3 positions shown · non-contrast
Comparison: None.

CLINICAL DATA: Acute right hip pain after lifting 2 days ago.

EXAM:
DG HIP (WITH OR WITHOUT PELVIS) 2-3V RIGHT

[pelvis ap]
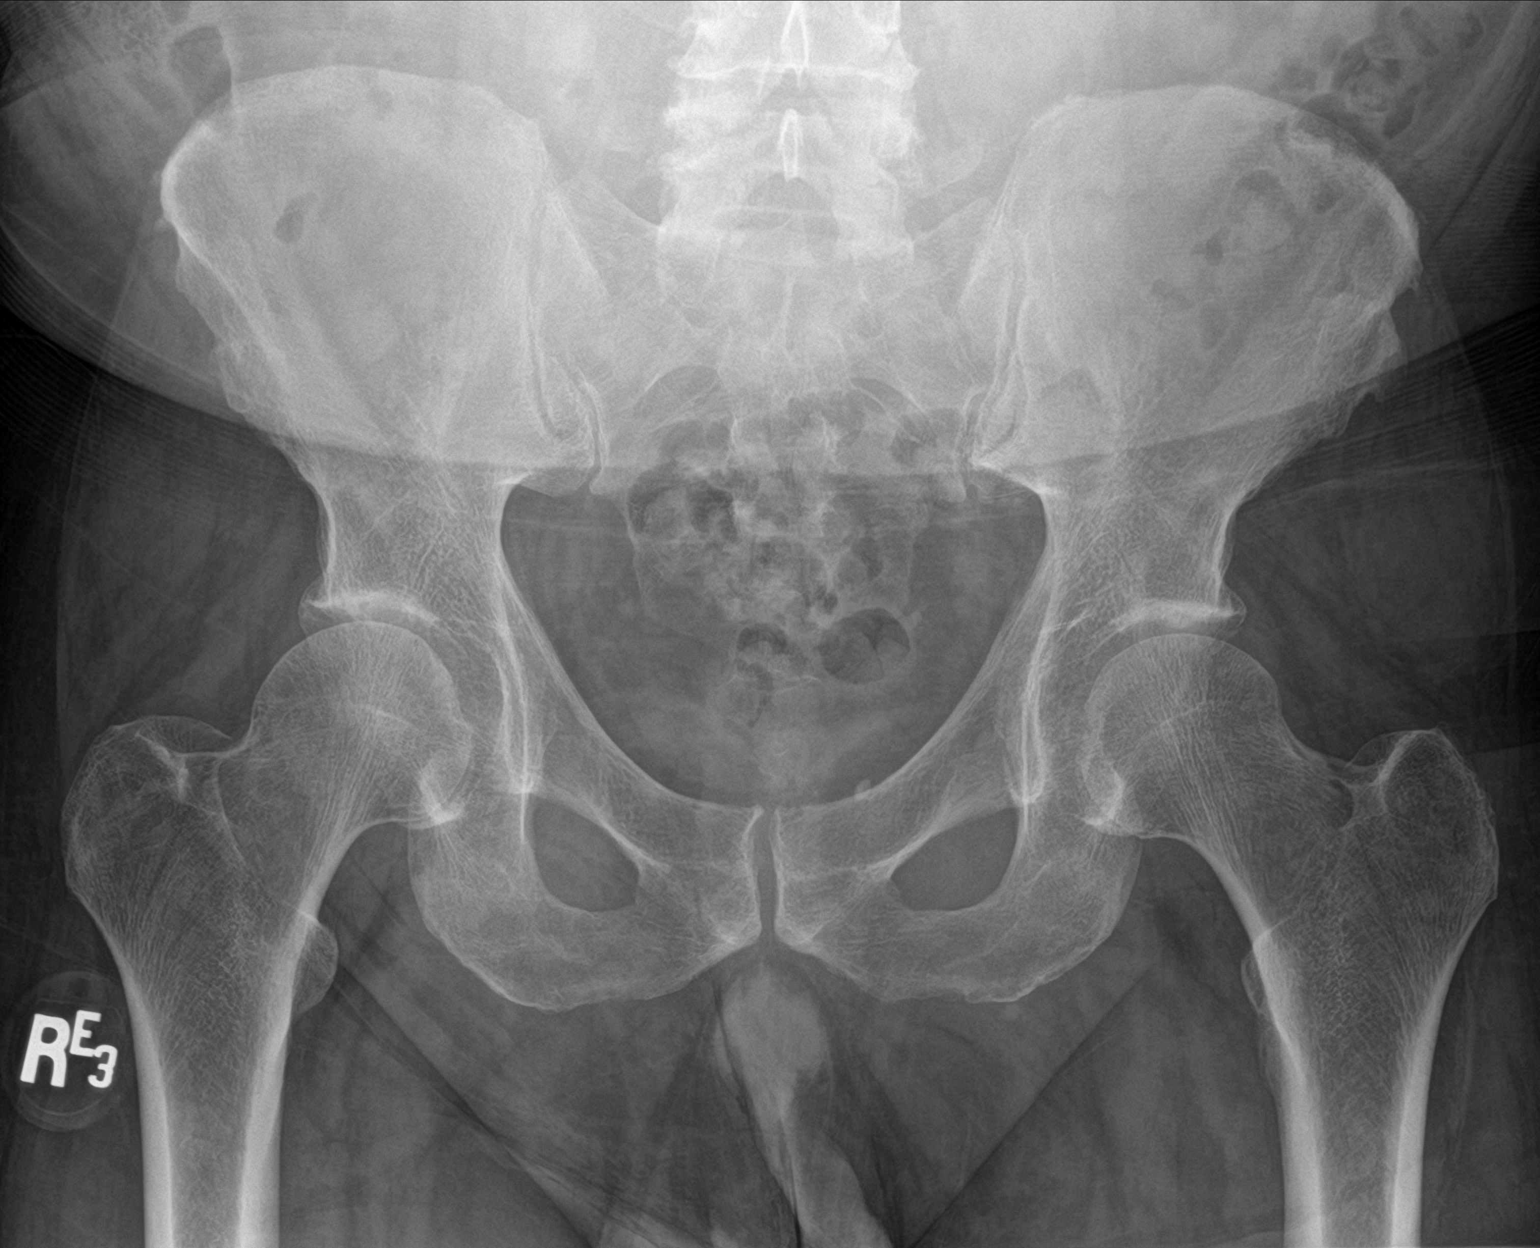

[hip ap]
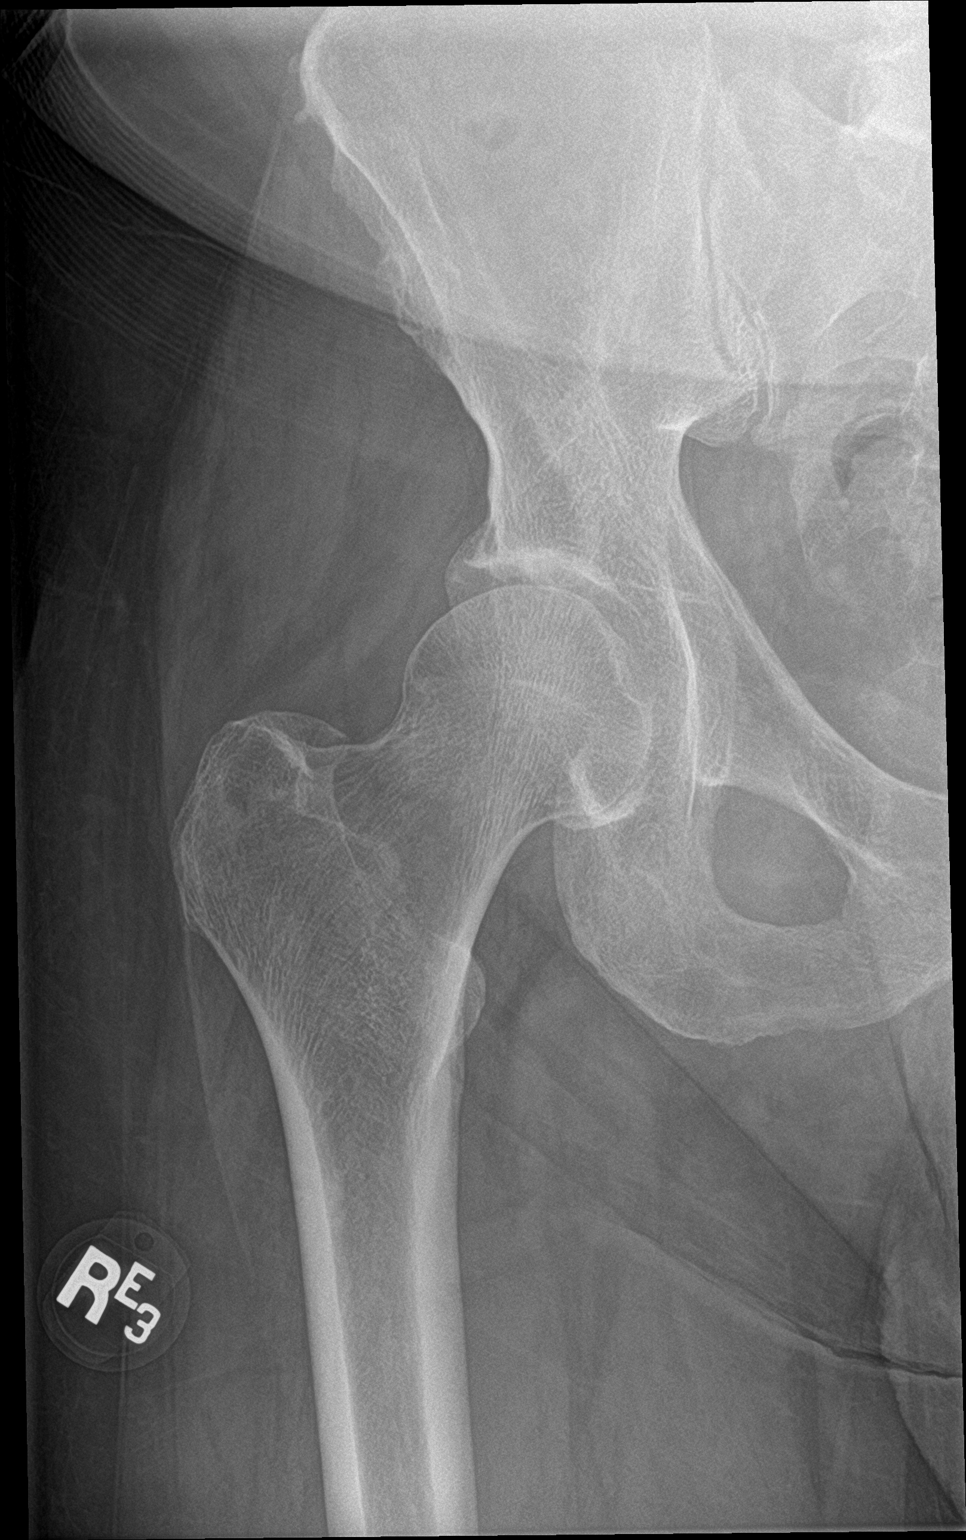

[hip frog leg]
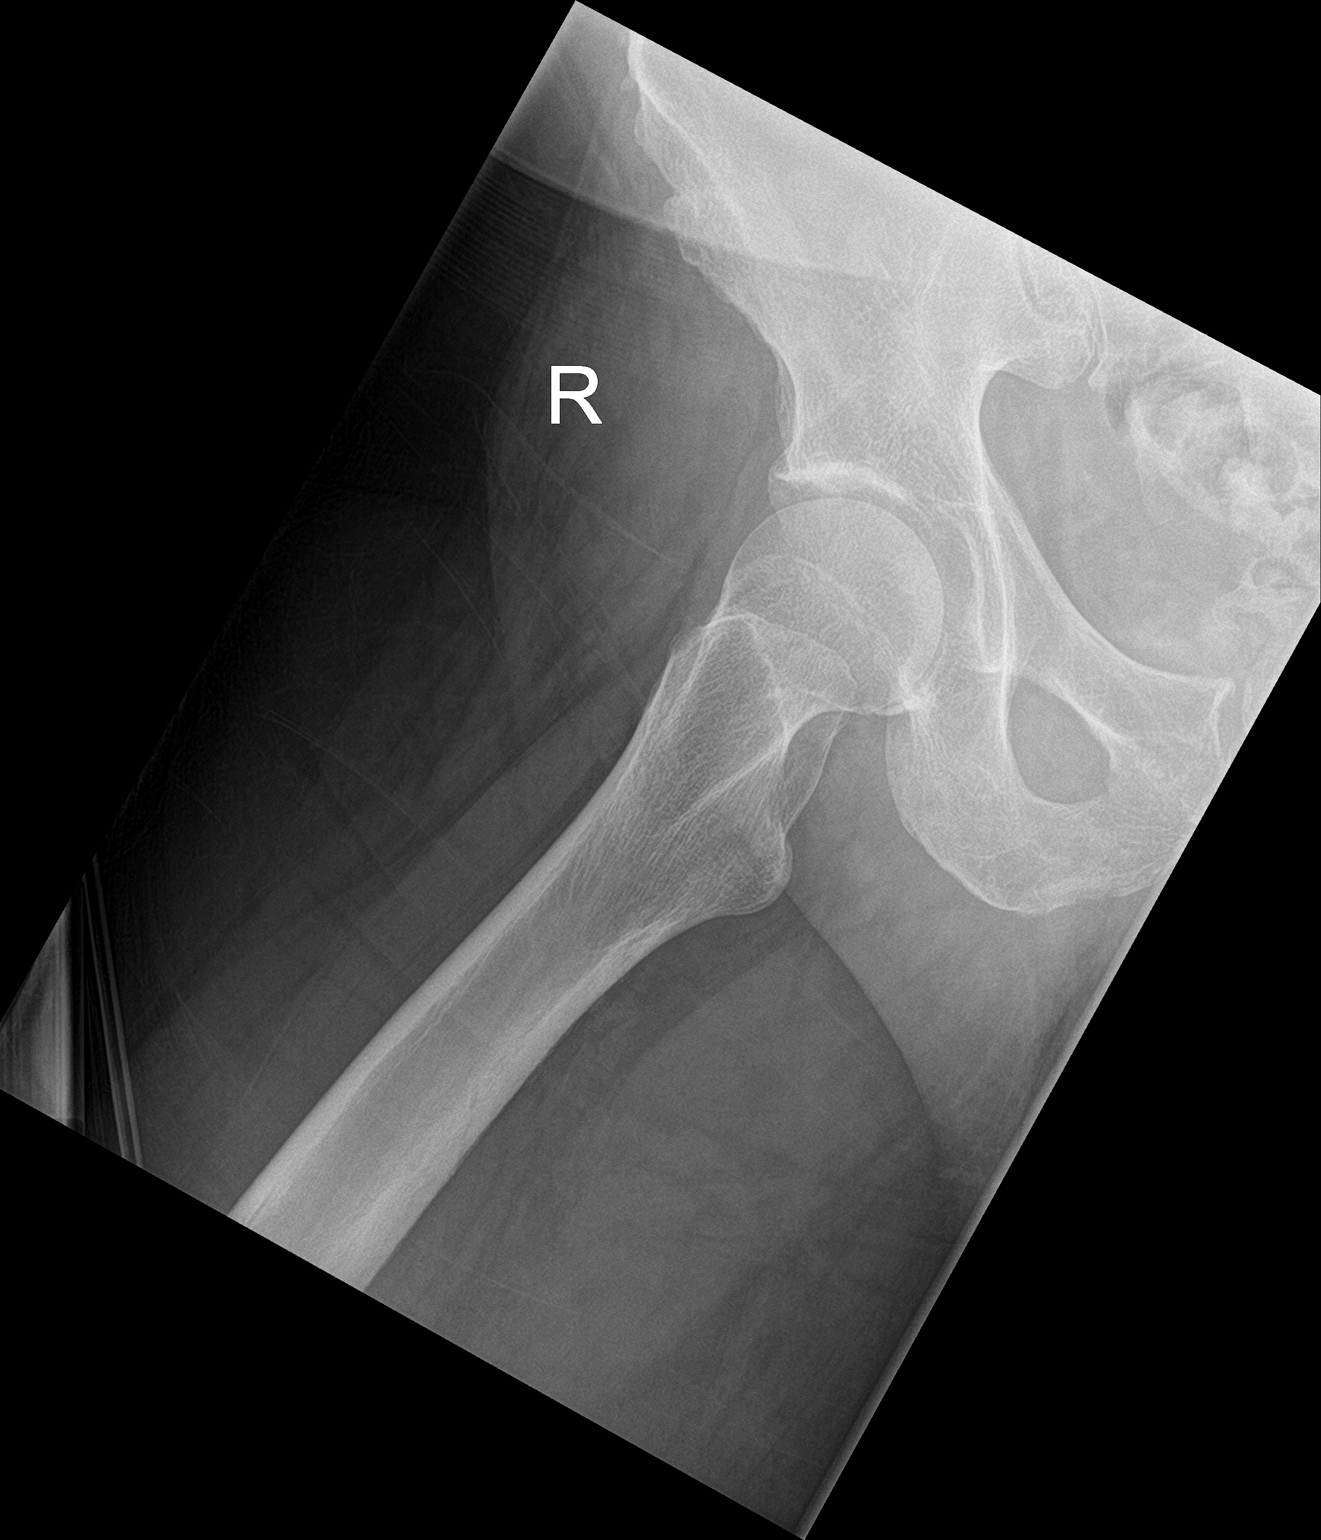

[3 of 3 positions shown; findings below may reference images not displayed]

FINDINGS: There is no evidence of hip fracture or dislocation. There is no
evidence of arthropathy or other focal bone abnormality.
IMPRESSION: Negative.

## 2021-12-10 ENCOUNTER — Telehealth: Payer: Self-pay | Admitting: Pulmonary Disease

## 2021-12-10 NOTE — Telephone Encounter (Signed)
error 

## 2021-12-11 NOTE — Telephone Encounter (Signed)
ATC no voicemail available  

## 2021-12-16 ENCOUNTER — Other Ambulatory Visit: Payer: Self-pay

## 2021-12-31 ENCOUNTER — Encounter: Payer: Self-pay | Admitting: Infectious Disease

## 2022-01-23 NOTE — Telephone Encounter (Signed)
Chart is noted

## 2022-01-23 NOTE — Telephone Encounter (Signed)
Will forward to Dr. Ander Slade as Juluis Rainier of pt's passing.   Will forward to Tony to change pt's chart to deceased. Thank you.
